# Patient Record
Sex: Male | Born: 1970 | Race: White | Hispanic: No | Marital: Married | Smoking: Former smoker
Health system: Southern US, Community
[De-identification: ages and names within clinical notes are randomized; demographics above are authoritative.]

## PROBLEM LIST (undated history)

## (undated) DIAGNOSIS — R918 Other nonspecific abnormal finding of lung field: Secondary | ICD-10-CM

## (undated) DIAGNOSIS — J45909 Unspecified asthma, uncomplicated: Secondary | ICD-10-CM

## (undated) DIAGNOSIS — J90 Pleural effusion, not elsewhere classified: Secondary | ICD-10-CM

## (undated) DIAGNOSIS — C8590 Non-Hodgkin lymphoma, unspecified, unspecified site: Secondary | ICD-10-CM

## (undated) DIAGNOSIS — I4891 Unspecified atrial fibrillation: Secondary | ICD-10-CM

## (undated) DIAGNOSIS — Z8616 Personal history of COVID-19: Secondary | ICD-10-CM

## (undated) DIAGNOSIS — K219 Gastro-esophageal reflux disease without esophagitis: Secondary | ICD-10-CM

## (undated) HISTORY — DX: Personal history of COVID-19: Z86.16

## (undated) HISTORY — DX: Other nonspecific abnormal finding of lung field: R91.8

## (undated) HISTORY — DX: Gastro-esophageal reflux disease without esophagitis: K21.9

## (undated) HISTORY — DX: Unspecified atrial fibrillation: I48.91

## (undated) HISTORY — DX: Pleural effusion, not elsewhere classified: J90

---

## 2019-08-31 ENCOUNTER — Other Ambulatory Visit: Payer: Self-pay

## 2020-11-01 ENCOUNTER — Other Ambulatory Visit: Payer: Self-pay | Admitting: Specialist

## 2020-11-01 DIAGNOSIS — R0602 Shortness of breath: Secondary | ICD-10-CM

## 2020-11-11 ENCOUNTER — Emergency Department: Payer: Medicaid Other

## 2020-11-11 ENCOUNTER — Other Ambulatory Visit: Payer: Self-pay

## 2020-11-11 ENCOUNTER — Inpatient Hospital Stay
Admission: EM | Admit: 2020-11-11 | Discharge: 2020-11-14 | DRG: 193 | Disposition: A | Discharge status: Home / Self Care | Payer: Medicaid Other | Attending: Hospitalist | Admitting: Hospitalist

## 2020-11-11 DIAGNOSIS — Z6821 Body mass index (BMI) 21.0-21.9, adult: Secondary | ICD-10-CM

## 2020-11-11 DIAGNOSIS — R918 Other nonspecific abnormal finding of lung field: Secondary | ICD-10-CM | POA: Diagnosis present

## 2020-11-11 DIAGNOSIS — E871 Hypo-osmolality and hyponatremia: Secondary | ICD-10-CM | POA: Diagnosis present

## 2020-11-11 DIAGNOSIS — J189 Pneumonia, unspecified organism: Principal | ICD-10-CM | POA: Diagnosis present

## 2020-11-11 DIAGNOSIS — F17210 Nicotine dependence, cigarettes, uncomplicated: Secondary | ICD-10-CM | POA: Diagnosis present

## 2020-11-11 DIAGNOSIS — C3492 Malignant neoplasm of unspecified part of left bronchus or lung: Secondary | ICD-10-CM

## 2020-11-11 DIAGNOSIS — E43 Unspecified severe protein-calorie malnutrition: Secondary | ICD-10-CM | POA: Insufficient documentation

## 2020-11-11 DIAGNOSIS — K219 Gastro-esophageal reflux disease without esophagitis: Secondary | ICD-10-CM

## 2020-11-11 DIAGNOSIS — J440 Chronic obstructive pulmonary disease with acute lower respiratory infection: Secondary | ICD-10-CM | POA: Diagnosis present

## 2020-11-11 DIAGNOSIS — J45901 Unspecified asthma with (acute) exacerbation: Secondary | ICD-10-CM | POA: Diagnosis present

## 2020-11-11 DIAGNOSIS — Z79899 Other long term (current) drug therapy: Secondary | ICD-10-CM

## 2020-11-11 DIAGNOSIS — J441 Chronic obstructive pulmonary disease with (acute) exacerbation: Secondary | ICD-10-CM | POA: Diagnosis present

## 2020-11-11 DIAGNOSIS — Z20822 Contact with and (suspected) exposure to covid-19: Secondary | ICD-10-CM | POA: Diagnosis present

## 2020-11-11 HISTORY — DX: Unspecified asthma, uncomplicated: J45.909

## 2020-11-11 LAB — COMPREHENSIVE METABOLIC PANEL
ALT: 13 U/L (ref 0–44)
AST: 25 U/L (ref 15–41)
Albumin: 2.9 g/dL — ABNORMAL LOW (ref 3.5–5.0)
Alkaline Phosphatase: 76 U/L (ref 38–126)
Anion gap: 11 (ref 5–15)
BUN: 9 mg/dL (ref 6–20)
CO2: 23 mmol/L (ref 22–32)
Calcium: 9 mg/dL (ref 8.9–10.3)
Chloride: 99 mmol/L (ref 98–111)
Creatinine, Ser: 0.44 mg/dL — ABNORMAL LOW (ref 0.61–1.24)
GFR, Estimated: >60 mL/min (ref >60)
Glucose, Bld: 124 mg/dL — ABNORMAL HIGH (ref 70–99)
Potassium: 4.3 mmol/L (ref 3.5–5.1)
Sodium: 133 mmol/L — ABNORMAL LOW (ref 135–145)
Total Bilirubin: 0.5 mg/dL (ref 0.3–1.2)
Total Protein: 6.9 g/dL (ref 6.5–8.1)

## 2020-11-11 LAB — CBC
HCT: 43.2 % (ref 39.0–52.0)
Hemoglobin: 14.9 g/dL (ref 13.0–17.0)
MCH: 32.7 pg (ref 26.0–34.0)
MCHC: 34.5 g/dL (ref 30.0–36.0)
MCV: 94.9 fL (ref 80.0–100.0)
Platelets: 410 10*3/uL — ABNORMAL HIGH (ref 150–400)
RBC: 4.55 MIL/uL (ref 4.22–5.81)
RDW: 13.1 % (ref 11.5–15.5)
WBC: 11.1 10*3/uL — ABNORMAL HIGH (ref 4.0–10.5)
nRBC: 0 % (ref 0.0–0.2)

## 2020-11-11 LAB — LACTIC ACID, PLASMA: Lactic Acid, Venous: 1.3 mmol/L (ref 0.5–1.9)

## 2020-11-11 LAB — RESP PANEL BY RT-PCR (FLU A&B, COVID) ARPGX2
Influenza A by PCR: NEGATIVE
Influenza B by PCR: NEGATIVE
SARS Coronavirus 2 by RT PCR: NEGATIVE

## 2020-11-11 MED ORDER — DM-GUAIFENESIN ER 30-600 MG PO TB12
1.0000 | ORAL_TABLET | Freq: Two times a day (BID) | ORAL | Status: DC
  Administered 2020-11-12 – 2020-11-14 (×5): 1 via ORAL
  Filled 2020-11-11 (×7): qty 1

## 2020-11-11 MED ORDER — SODIUM CHLORIDE 0.9 % IV SOLN
2.0000 g | INTRAVENOUS | Status: DC
  Administered 2020-11-12 – 2020-11-14 (×3): 2 g via INTRAVENOUS
  Filled 2020-11-11 (×4): qty 20

## 2020-11-11 MED ORDER — IOHEXOL 350 MG/ML SOLN
75.0000 mL | Freq: Once | INTRAVENOUS | Status: AC | PRN
  Administered 2020-11-11: 75 mL via INTRAVENOUS

## 2020-11-11 MED ORDER — DM-GUAIFENESIN ER 30-600 MG PO TB12
1.0000 | ORAL_TABLET | Freq: Two times a day (BID) | ORAL | Status: DC | PRN
  Administered 2020-11-12 – 2020-11-14 (×2): 1 via ORAL
  Filled 2020-11-11 (×2): qty 1

## 2020-11-11 MED ORDER — SODIUM CHLORIDE 0.9 % IV BOLUS
1000.0000 mL | Freq: Once | INTRAVENOUS | Status: AC
  Administered 2020-11-11: 1000 mL via INTRAVENOUS

## 2020-11-11 MED ORDER — ACETAMINOPHEN 650 MG RE SUPP: 650.0000 mg | Freq: Four times a day (QID) | RECTAL | Status: DC | PRN

## 2020-11-11 MED ORDER — SODIUM CHLORIDE 0.9 % IV SOLN
500.0000 mg | INTRAVENOUS | Status: DC
  Administered 2020-11-12 – 2020-11-13 (×3): 500 mg via INTRAVENOUS
  Filled 2020-11-11 (×5): qty 500

## 2020-11-11 MED ORDER — MAGNESIUM SULFATE 2 GM/50ML IV SOLN
2.0000 g | INTRAVENOUS | Status: AC
  Administered 2020-11-11: 2 g via INTRAVENOUS
  Filled 2020-11-11: qty 50

## 2020-11-11 MED ORDER — SODIUM CHLORIDE 0.9 % IV SOLN: INTRAVENOUS | Status: DC

## 2020-11-11 MED ORDER — ENOXAPARIN SODIUM 40 MG/0.4ML IJ SOSY
40.0000 mg | PREFILLED_SYRINGE | INTRAMUSCULAR | Status: DC
  Administered 2020-11-11 – 2020-11-13 (×3): 40 mg via SUBCUTANEOUS
  Filled 2020-11-11 (×3): qty 0.4

## 2020-11-11 MED ORDER — METHYLPREDNISOLONE SODIUM SUCC 125 MG IJ SOLR
125.0000 mg | Freq: Once | INTRAMUSCULAR | Status: AC
  Administered 2020-11-11: 125 mg via INTRAVENOUS
  Filled 2020-11-11: qty 2

## 2020-11-11 MED ORDER — IPRATROPIUM-ALBUTEROL 0.5-2.5 (3) MG/3ML IN SOLN
3.0000 mL | Freq: Four times a day (QID) | RESPIRATORY_TRACT | Status: DC
  Administered 2020-11-12 (×3): 3 mL via RESPIRATORY_TRACT
  Filled 2020-11-11 (×3): qty 3

## 2020-11-11 MED ORDER — MAGNESIUM HYDROXIDE 400 MG/5ML PO SUSP: 30.0000 mL | Freq: Every day | ORAL | Status: DC | PRN

## 2020-11-11 MED ORDER — SODIUM CHLORIDE 0.9 % IV SOLN
2.0000 g | Freq: Once | INTRAVENOUS | Status: AC
  Administered 2020-11-11: 2 g via INTRAVENOUS
  Filled 2020-11-11: qty 2

## 2020-11-11 MED ORDER — ALBUTEROL SULFATE (2.5 MG/3ML) 0.083% IN NEBU
5.0000 mg | INHALATION_SOLUTION | Freq: Once | RESPIRATORY_TRACT | Status: AC
  Administered 2020-11-11: 5 mg via RESPIRATORY_TRACT
  Filled 2020-11-11: qty 6

## 2020-11-11 MED ORDER — ACETAMINOPHEN 325 MG PO TABS: 650.0000 mg | ORAL_TABLET | Freq: Four times a day (QID) | ORAL | Status: DC | PRN

## 2020-11-11 MED ORDER — ONDANSETRON HCL 4 MG/2ML IJ SOLN: 4.0000 mg | Freq: Four times a day (QID) | INTRAMUSCULAR | Status: DC | PRN

## 2020-11-11 MED ORDER — VANCOMYCIN HCL 1500 MG/300ML IV SOLN
1500.0000 mg | Freq: Once | INTRAVENOUS | Status: AC
  Administered 2020-11-11: 1500 mg via INTRAVENOUS
  Filled 2020-11-11: qty 300

## 2020-11-11 MED ORDER — ONDANSETRON HCL 4 MG PO TABS: 4.0000 mg | ORAL_TABLET | Freq: Four times a day (QID) | ORAL | Status: DC | PRN

## 2020-11-11 MED ORDER — VANCOMYCIN HCL IN DEXTROSE 1-5 GM/200ML-% IV SOLN: 1000.0000 mg | Freq: Once | INTRAVENOUS | Status: DC

## 2020-11-11 MED ORDER — TRAZODONE HCL 50 MG PO TABS
25.0000 mg | ORAL_TABLET | Freq: Every evening | ORAL | Status: DC | PRN
  Administered 2020-11-12 – 2020-11-13 (×2): 25 mg via ORAL
  Filled 2020-11-11 (×2): qty 1

## 2020-11-11 MED ORDER — SODIUM CHLORIDE 0.9 % IV BOLUS
500.0000 mL | Freq: Once | INTRAVENOUS | Status: AC
  Administered 2020-11-12: 500 mL via INTRAVENOUS

## 2020-11-11 MED ORDER — IPRATROPIUM-ALBUTEROL 0.5-2.5 (3) MG/3ML IN SOLN
3.0000 mL | Freq: Once | RESPIRATORY_TRACT | Status: AC
  Administered 2020-11-11: 3 mL via RESPIRATORY_TRACT
  Filled 2020-11-11: qty 3

## 2020-11-11 NOTE — ED Notes (Signed)
Patient transported to CT 

## 2020-11-11 NOTE — ED Provider Notes (Signed)
[facility] Emergency Department Provider Note  ____________________________________________  Time seen: Approximately 10:51 PM  I have reviewed the triage vital signs and the nursing notes.   HISTORY  Chief Complaint Shortness of Breath    HPI Roc Streett. is a 50 y.o. male with no known past medical history who comes the ED due to persistent shortness of breath wheezing and cough that is been going on for several weeks.  He has previously seen primary care, completed 2 course of antibiotics without relief.  Referred to pulmonology, but has not had additional imaging yet.  Previous chest x-rays were concerning for possible pulmonary mass.  Patient also reports central chest pain that is sharp and pleuritic.  Nonradiating.  No alleviating factors, moderate intensity.      No past medical history on file.   Patient Active Problem List   Diagnosis Date Noted  . CAP (community acquired pneumonia) 11/11/2020        Prior to Admission medications   Medication Sig Start Date End Date Taking? Authorizing Provider  albuterol (PROVENTIL) (2.5 MG/3ML) 0.083% nebulizer solution Take 2.5 mg by nebulization every 6 (six) hours as needed for wheezing or shortness of breath.   Yes [provider]  albuterol (VENTOLIN HFA) 108 (90 Base) MCG/ACT inhaler Inhale 1-2 puffs into the lungs every 4 (four) hours as needed for wheezing or shortness of breath.   Yes [provider]  chlorpheniramine-HYDROcodone (TUSSIONEX) 10-8 MG/5ML SUER Take 5 mLs by mouth every 12 (twelve) hours as needed for cough. 11/09/20  Yes [provider]  omeprazole (PRILOSEC) 10 MG capsule Take 20 mg by mouth daily.   Yes [provider]     Allergies Patient has no known allergies.   No family history on file.  Social History    Review of Systems  Constitutional:   No fever or chills.  ENT:   No sore throat. No rhinorrhea. Cardiovascular:    Positive chest pain as above without syncope. Respiratory: Positive shortness of breath and cough. Gastrointestinal:   Negative for abdominal pain, vomiting and diarrhea.  Musculoskeletal:   Negative for focal pain or swelling All other systems reviewed and are negative except as documented above in ROS and HPI.  ____________________________________________   PHYSICAL EXAM:  VITAL SIGNS: ED Triage Vitals  Enc Vitals Group     BP 11/11/20 1656 120/81     Pulse Rate 11/11/20 1656 (!) 136     Resp 11/11/20 1656 20     Temp 11/11/20 1656 98.3 F (36.8 C)     Temp Source 11/11/20 1656 Oral     SpO2 11/11/20 1656 96 %     Weight 11/11/20 1657 140 lb (63.5 kg)     Height 11/11/20 1657 5\' 8"  (1.727 m)     Head Circumference --      Peak Flow --      Pain Score 11/11/20 1657 8     Pain Loc --      Pain Edu? --      Excl. in [place]? --     Vital signs reviewed, nursing assessments reviewed.   Constitutional:   Alert and oriented. Non-toxic appearance. Eyes:   Conjunctivae are normal. EOMI. PERRL. ENT      Head:   Normocephalic and atraumatic.      Nose:   Wearing a mask.      Mouth/Throat:   Wearing a mask.      Neck:   No meningismus. Full  ROM. Hematological/Lymphatic/Immunilogical:   No cervical lymphadenopathy. Cardiovascular:   Tachycardia heart rate 130. Symmetric bilateral radial and DP pulses.  No murmurs. Cap refill less than 2 seconds. Respiratory: Normal work of breathing without retractions or tachypnea.  Diffuse left-sided crackles.  Diffuse wheezing and prolonged expiratory phase. Gastrointestinal:   Soft and nontender. Non distended. There is no CVA tenderness.  No rebound, rigidity, or guarding.  Musculoskeletal:   Normal range of motion in all extremities. No joint effusions.  No lower extremity tenderness.  No edema. Neurologic:   Normal speech and language.  Motor grossly intact. No acute focal neurologic deficits are appreciated.  Skin:    Skin is warm, dry and  intact. No rash noted.  No petechiae, purpura, or bullae.  ____________________________________________    LABS (pertinent positives/negatives) (all labs ordered are listed, but only abnormal results are displayed) Labs Reviewed  CBC - Abnormal; Notable for the following components:      Result Value   WBC 11.1 (*)    Platelets 410 (*)    All other components within normal limits  COMPREHENSIVE METABOLIC PANEL - Abnormal; Notable for the following components:   Sodium 133 (*)    Glucose, Bld 124 (*)    Creatinine, Ser 0.44 (*)    Albumin 2.9 (*)    All other components within normal limits  RESP PANEL BY RT-PCR (FLU A&B, COVID) ARPGX2  CULTURE, BLOOD (ROUTINE X 2)  CULTURE, BLOOD (ROUTINE X 2)  EXPECTORATED SPUTUM ASSESSMENT W GRAM STAIN, RFLX TO RESP C  LACTIC ACID, PLASMA  HIV ANTIBODY (ROUTINE TESTING W REFLEX)  BASIC METABOLIC PANEL  CBC  LEGIONELLA PNEUMOPHILA SEROGP 1 UR AG  STREP PNEUMONIAE URINARY ANTIGEN   ____________________________________________   EKG  Interpreted by me Sinus tachycardia rate 131.  Right axis.  Normal QRS.  Normal ST segments.  There is an S1Q3T3 pattern  ____________________________________________    RADIOLOGY  DG Chest 2 View  Result Date: 11/11/2020 CLINICAL DATA:  Shortness of breath EXAM: CHEST - 2 VIEW COMPARISON:  None. FINDINGS: There is an area that appears masslike in the posterior segment of the left lower lobe measuring 6.8 x 6.5 x 6.4 cm. There is extensive airspace opacity throughout the left upper lobe as well. A small area of infiltrate is noted more inferiorly on the left. Right lung is clear. Heart size and pulmonary vascularity normal. Questionable adenopathy left hilum. No bone lesions. IMPRESSION: Suspected neoplastic focus in the superior segment left lower lobe measuring 6.8 x 6.5 x 6.4 cm. Apparent pneumonia throughout the left upper lobe. Ill-defined opacity left lower lobe may represent focal pneumonia, although on  the frontal view, this area appears masslike and could represent a second potential neoplasm. This area on frontal view measures 3.7 x 3.1 cm. Advise chest CT for further assessment. Right lung clear. Heart size normal. Suspected left hilar adenopathy. Electronically Signed   By: Lowella Grip III M.D.   On: 11/11/2020 17:41   CT Angio Chest PE W and/or Wo Contrast  Result Date: 11/11/2020 CLINICAL DATA:  Lung mass. EXAM: CT ANGIOGRAPHY CHEST CT ABDOMEN AND PELVIS WITH CONTRAST TECHNIQUE: Multidetector CT imaging of the chest was performed using the standard protocol during bolus administration of intravenous contrast. Multiplanar CT image reconstructions and MIPs were obtained to evaluate the vascular anatomy. Multidetector CT imaging of the abdomen and pelvis was performed using the standard protocol during bolus administration of intravenous contrast. CONTRAST:  38mL OMNIPAQUE IOHEXOL 350 MG/ML SOLN COMPARISON:  Chest  x-ray from same day. FINDINGS: CTA CHEST FINDINGS Cardiovascular: Satisfactory opacification of the pulmonary arteries to the segmental level. No evidence of pulmonary embolism. Narrowing of the left main, lobar, and segmental pulmonary arteries due to tumor. Normal heart size. No pericardial effusion. Mediastinum/Nodes: Bulky mediastinal left hilar lymphadenopathy. Index prevascular lymph node measures 2.2 cm in short axis. Conglomerate subcarinal and left hilar lymphadenopathy and/or tumor, difficult to measure. Lower paraesophageal lymph node measuring 9 mm in short axis (series 4, image 81). Prominent left cardiophrenic angle lymph node measuring 7 mm in short axis (series 4, image 96). No pathologically enlarged right hilar or axillary lymph nodes. The thyroid gland, trachea, and esophagus demonstrate no significant findings. Lungs/Pleura: Large left perihilar and medial upper lobe mass, difficult to measure, with near complete occlusion of the central left upper and lower lobe bronchi.  7.5 x 4.1 cm mass predominantly within the superior segment of the left lower lobe, but crossing the major fissure. 1.2 x 2.6 cm subpleural nodule in the anterior left lower lobe (series 4, image 73). Patchy consolidation in the left upper lobe due to post obstructive pneumonia. Nodular interlobular septal thickening in the left upper lobe. Nodularity along the left major fissure superiorly. Small to moderate partially loculated left pleural effusion. The right lung is clear. No pneumothorax. Musculoskeletal: No chest wall abnormality. No acute or significant osseous findings. Review of the MIP images confirms the above findings. CT ABDOMEN AND PELVIS FINDINGS Hepatobiliary: Few scattered subcentimeter low-density lesions within the liver, too small to characterize. The gallbladder is unremarkable. No biliary dilatation. Pancreas: Unremarkable. No pancreatic ductal dilatation or surrounding inflammatory changes. Spleen: Normal in size without focal abnormality. Adrenals/Urinary Tract: Adrenal glands are unremarkable. 1.3 cm left renal simple cyst. No renal calculi or hydronephrosis. The bladder is under distended. Stomach/Bowel: Stomach is within normal limits. Appendix appears normal. No evidence of bowel wall thickening, distention, or inflammatory changes. Vascular/Lymphatic: No significant vascular findings are present. No enlarged abdominal or pelvic lymph nodes. Reproductive: Prostate is unremarkable. Other: Small fat containing left inguinal hernia. No free fluid or pneumoperitoneum. Musculoskeletal: No acute or significant osseous findings. Review of the MIP images confirms the above findings. IMPRESSION: CT chest: 1. Large left perihilar and medial upper lobe mass with mediastinal invasion, consistent with primary lung cancer. Metastases in the left lower lobe measuring up to 7.5 cm. 2. Left upper lobe lymphangitic carcinomatosis. Bulky mediastinal and left hilar nodal metastases. 3. Near complete occlusion  of the central left upper and lower lobe bronchi. Left upper lobe postobstructive pneumonia. 4. No evidence of pulmonary embolism. Narrowing of the left pulmonary arteries due to tumor. 5. 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 CT abdomen pelvis: 1. No evidence of metastatic disease in the abdomen or pelvis. Electronically Signed   By: Titus Dubin M.D.   On: 11/11/2020 21:37   CT ABDOMEN PELVIS W CONTRAST  Result Date: 11/11/2020 CLINICAL DATA:  Lung mass. EXAM: CT ANGIOGRAPHY CHEST CT ABDOMEN AND PELVIS WITH CONTRAST TECHNIQUE: Multidetector CT imaging of the chest was performed using the standard protocol during bolus administration of intravenous contrast. Multiplanar CT image reconstructions and MIPs were obtained to evaluate the vascular anatomy. Multidetector CT imaging of the abdomen and pelvis was performed using the standard protocol during bolus administration of intravenous contrast. CONTRAST:  3mL OMNIPAQUE IOHEXOL 350 MG/ML SOLN COMPARISON:  Chest x-ray from same day. FINDINGS: CTA CHEST FINDINGS Cardiovascular: Satisfactory opacification of the pulmonary arteries to the segmental level. No evidence of  pulmonary embolism. Narrowing of the left main, lobar, and segmental pulmonary arteries due to tumor. Normal heart size. No pericardial effusion. Mediastinum/Nodes: Bulky mediastinal left hilar lymphadenopathy. Index prevascular lymph node measures 2.2 cm in short axis. Conglomerate subcarinal and left hilar lymphadenopathy and/or tumor, difficult to measure. Lower paraesophageal lymph node measuring 9 mm in short axis (series 4, image 81). Prominent left cardiophrenic angle lymph node measuring 7 mm in short axis (series 4, image 96). No pathologically enlarged right hilar or axillary lymph nodes. The thyroid gland, trachea, and esophagus demonstrate no significant findings. Lungs/Pleura: Large left perihilar and medial upper lobe mass, difficult to measure, with  near complete occlusion of the central left upper and lower lobe bronchi. 7.5 x 4.1 cm mass predominantly within the superior segment of the left lower lobe, but crossing the major fissure. 1.2 x 2.6 cm subpleural nodule in the anterior left lower lobe (series 4, image 73). Patchy consolidation in the left upper lobe due to post obstructive pneumonia. Nodular interlobular septal thickening in the left upper lobe. Nodularity along the left major fissure superiorly. Small to moderate partially loculated left pleural effusion. The right lung is clear. No pneumothorax. Musculoskeletal: No chest wall abnormality. No acute or significant osseous findings. Review of the MIP images confirms the above findings. CT ABDOMEN AND PELVIS FINDINGS Hepatobiliary: Few scattered subcentimeter low-density lesions within the liver, too small to characterize. The gallbladder is unremarkable. No biliary dilatation. Pancreas: Unremarkable. No pancreatic ductal dilatation or surrounding inflammatory changes. Spleen: Normal in size without focal abnormality. Adrenals/Urinary Tract: Adrenal glands are unremarkable. 1.3 cm left renal simple cyst. No renal calculi or hydronephrosis. The bladder is under distended. Stomach/Bowel: Stomach is within normal limits. Appendix appears normal. No evidence of bowel wall thickening, distention, or inflammatory changes. Vascular/Lymphatic: No significant vascular findings are present. No enlarged abdominal or pelvic lymph nodes. Reproductive: Prostate is unremarkable. Other: Small fat containing left inguinal hernia. No free fluid or pneumoperitoneum. Musculoskeletal: No acute or significant osseous findings. Review of the MIP images confirms the above findings. IMPRESSION: CT chest: 1. Large left perihilar and medial upper lobe mass with mediastinal invasion, consistent with primary lung cancer. Metastases in the left lower lobe measuring up to 7.5 cm. 2. Left upper lobe lymphangitic carcinomatosis.  Bulky mediastinal and left hilar nodal metastases. 3. Near complete occlusion of the central left upper and lower lobe bronchi. Left upper lobe postobstructive pneumonia. 4. No evidence of pulmonary embolism. Narrowing of the left pulmonary arteries due to tumor. 5. 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 CT abdomen pelvis: 1. No evidence of metastatic disease in the abdomen or pelvis. Electronically Signed   By: Titus Dubin M.D.   On: 11/11/2020 21:37    ____________________________________________   PROCEDURES Procedures  ____________________________________________  DIFFERENTIAL DIAGNOSIS   Pulmonary embolism, postobstructive pneumonia, lung cancer, pleural effusion, bronchitis with bronchoconstriction  CLINICAL IMPRESSION / ASSESSMENT AND PLAN / ED COURSE  Medications ordered in the ED: Medications  vancomycin (VANCOREADY) IVPB 1500 mg/300 mL (1,500 mg Intravenous New Bag/Given 11/11/20 2141)  enoxaparin (LOVENOX) injection 40 mg (has no administration in time range)  0.9 %  sodium chloride infusion (has no administration in time range)  acetaminophen (TYLENOL) tablet 650 mg (has no administration in time range)    Or  acetaminophen (TYLENOL) suppository 650 mg (has no administration in time range)  traZODone (DESYREL) tablet 25 mg (has no administration in time range)  magnesium hydroxide (MILK OF MAGNESIA) suspension 30 mL (has no  administration in time range)  ondansetron (ZOFRAN) tablet 4 mg (has no administration in time range)    Or  ondansetron (ZOFRAN) injection 4 mg (has no administration in time range)  cefTRIAXone (ROCEPHIN) 2 g in sodium chloride 0.9 % 100 mL IVPB (has no administration in time range)  azithromycin (ZITHROMAX) 500 mg in sodium chloride 0.9 % 250 mL IVPB (has no administration in time range)  dextromethorphan-guaiFENesin (MUCINEX DM) 30-600 MG per 12 hr tablet 1 tablet (has no administration in time range)   dextromethorphan-guaiFENesin (MUCINEX DM) 30-600 MG per 12 hr tablet 1 tablet (has no administration in time range)  ipratropium-albuterol (DUONEB) 0.5-2.5 (3) MG/3ML nebulizer solution 3 mL (has no administration in time range)  sodium chloride 0.9 % bolus 1,000 mL (0 mLs Intravenous Stopped 11/11/20 2146)  methylPREDNISolone sodium succinate (SOLU-MEDROL) 125 mg/2 mL injection 125 mg (125 mg Intravenous Given 11/11/20 2040)  magnesium sulfate IVPB 2 g 50 mL (0 g Intravenous Stopped 11/11/20 2141)  ipratropium-albuterol (DUONEB) 0.5-2.5 (3) MG/3ML nebulizer solution 3 mL (3 mLs Nebulization Given 11/11/20 2032)  albuterol (PROVENTIL) (2.5 MG/3ML) 0.083% nebulizer solution 5 mg (5 mg Nebulization Given 11/11/20 2038)  ceFEPIme (MAXIPIME) 2 g in sodium chloride 0.9 % 100 mL IVPB (0 g Intravenous Stopped 11/11/20 2131)  iohexol (OMNIPAQUE) 350 MG/ML injection 75 mL (75 mLs Intravenous Contrast Given 11/11/20 2055)    Pertinent labs & imaging results that were available during my care of the patient were reviewed by me and considered in my medical decision making (see chart for details).  Curtis Pyo. was evaluated in Emergency Department on 11/11/2020 for the symptoms described in the history of present illness. He was evaluated in the context of the global COVID-19 pandemic, which necessitated consideration that the patient might be at risk for infection with the SARS-CoV-2 virus that causes COVID-19. Institutional protocols and algorithms that pertain to the evaluation of patients at risk for COVID-19 are in a state of rapid change based on information released by regulatory bodies including the CDC and federal and state organizations. These policies and algorithms were followed during the patient's care in the ED.     Clinical Course as of 11/11/20 2251  Fri Nov 11, 2020  [id number] Patient presents with tachycardia, clinical presentation of bronchoconstriction, pleuritic chest pain, and x-ray evidence  of likely undiagnosed lung cancer.  High suspicion for PE.  Will obtain CT angiogram of the chest along with CT scan abdomen pelvis to evaluate for metastatic disease.  We will give bronchodilators steroids and magnesium, vancomycin and cefepime for HCAP/outpatient treatment failure, and plan to admit [PS]    Clinical Course User Index [PS] Carrie Mew, MD     ----------------------------------------- 11:19 PM on 11/11/2020 -----------------------------------------  CT negative for PE but does show large lung mass with localized lung metastasis, no distant metastases.  There is also postobstructive pneumonia.  Lactate is normal, blood culture sent.  Case d/w hospitalist.  ____________________________________________   FINAL CLINICAL IMPRESSION(S) / ED DIAGNOSES    Final diagnoses:  Malignant neoplasm of left lung, unspecified part of lung ([place])  Postobstructive pneumonia     ED Discharge Orders    None      Portions of this note were generated with dragon dictation software. Dictation errors may occur despite best attempts at proofreading.   Carrie Mew, MD 11/11/20 2320

## 2020-11-11 NOTE — H&P (Signed)
[facility]   PATIENT NAME: Curtis Manning    MR#:  [id number]  DATE OF BIRTH:  1970/10/13  DATE OF ADMISSION:  11/11/2020  PRIMARY CARE PHYSICIAN: Pcp, No   Patient is coming from: Home  REQUESTING/REFERRING PHYSICIAN: Brenton Grills, MD CHIEF COMPLAINT:   Chief Complaint  Patient presents with  . Shortness of Breath    HISTORY OF PRESENT ILLNESS:  Curtis Junious. is a 50 y.o. Caucasian male with medical history significant for asthma, ongoing tobacco abuseand GERD who presented to the emergency room with acute onset of significant worsening dyspnea with associated cough productive of clear sputum as well as wheezing.  The patient has been actually having symptoms for the last few weeks.  He was seen on an outpatient basis and was given 2 courses of antibiotics.  He had a chest x-ray that showed suspected lung mass and was supposed to be referred to pulmonology.  He admitted to weight loss of about 35 pounds over the last 2 to 3 months.  He has been having night sweats and fever.  No chest pain or palpitations.  No nausea or vomiting or abdominal pain.  No dysuria, regular hematuria or flank pain.  No bleeding diathesis.   ED Course: Upon presentation to the ER rate was 136 with otherwise normal vital signs and later heart rate is 123.  Respiratory rate was initially normal and later 29.  Labs revealed hyponatremia 133 and albumin 2.9.  CBC showed minimal leukocytosis of 11.1 and thrombocytosis of 410.  Lactic acid was 1.3.  Influenza antigens and COVID-19 PCR came back negative.  Blood cultures were drawn.   EKG as reviewed by me : Showed heart rate was 136 with otherwise normal vital signs showed sinus tachycardia with a rate of 131 with right superior axis deviation, right ventricle hypertrophy. Imaging: Two-view chest x-ray showed: Suspected neoplastic focus in the superior segment left lower lobe measuring 6.8 x 6.5 x 6.4 cm. Apparent pneumonia throughout the left upper  lobe. Ill-defined opacity left lower lobe may represent focal pneumonia, although on the frontal view, this area appears masslike and could represent a second potential neoplasm. This area on frontal view measures 3.7 x 3.1 cm as well as left hilar adenopathy. Chest and abdomen CTA revealed the following: 1. Large left perihilar and medial upper lobe mass with mediastinal invasion, consistent with primary lung cancer. Metastases in the left lower lobe measuring up to 7.5 cm. 2. Left upper lobe lymphangitic carcinomatosis. Bulky mediastinal and left hilar nodal metastases. 3. Near complete occlusion of the central left upper and lower lobe bronchi. Left upper lobe postobstructive pneumonia. 4. No evidence of pulmonary embolism. Narrowing of the left pulmonary arteries due to tumor. 5. 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  CT abdomen pelvis:  1. No evidence of metastatic disease in the abdomen or pelvis.  The patient was given IV cefepime and vancomycin, duo nebs, 5 mg of nebulized albuterol, IV magnesium sulfate, IV Solu-Medrol and 1 L bolus of IV normal saline.  He will be admitted to a medical monitored bed for further evaluation and management.  PAST MEDICAL HISTORY:  Asthma, tobacco abuse and GERD.  PAST SURGICAL HISTORY:  He denies any previous surgeries.  SOCIAL HISTORY:   Social History   Tobacco Use  . Smoking status: Not on file  . Smokeless tobacco: Not on file  Substance Use Topics  . Alcohol use: Not on file  He smokes 1  to 2 cigarettes/day currently and used to smoke half a pack of cigarettes per day for long time.  FAMILY HISTORY:  His father had heart surgery likely CABG for coronary artery disease.  DRUG ALLERGIES:  No Known Allergies  REVIEW OF SYSTEMS:   ROS As per history of present illness. All pertinent systems were reviewed above. Constitutional, HEENT, cardiovascular, respiratory, GI, GU, musculoskeletal,  neuro, psychiatric, endocrine, integumentary and hematologic systems were reviewed and are otherwise negative/unremarkable except for positive findings mentioned above in the HPI.   MEDICATIONS AT HOME:   Prior to Admission medications   Not on File      VITAL SIGNS:  Blood pressure 121/86, pulse (!) 124, temperature 98.2 F (36.8 C), temperature source Oral, resp. rate (!) 27, height 5\' 8"  (1.727 m), weight 63.5 kg, SpO2 98 %.  PHYSICAL EXAMINATION:  Physical Exam  GENERAL:  50 y.o.-year-old Caucasian male patient lying in the bed with minimal respiratory distress with conversational dyspnea. EYES: Pupils equal, round, reactive to light and accommodation. No scleral icterus. Extraocular muscles intact.  HEENT: Head atraumatic, normocephalic. Oropharynx and nasopharynx clear.  NECK:  Supple, no jugular venous distention. No thyroid enlargement, no tenderness.  LUNGS: Significantly diminished left basal and midlung zone breath sounds with positive bronchophony. CARDIOVASCULAR: Regular rate and rhythm, S1, S2 normal. No murmurs, rubs, or gallops.  ABDOMEN: Soft, nondistended, nontender. Bowel sounds present. No organomegaly or mass.  EXTREMITIES: No pedal edema, cyanosis, or clubbing.  NEUROLOGIC: Cranial nerves II through XII are intact. Muscle strength 5/5 in all extremities. Sensation intact. Gait not checked.  PSYCHIATRIC: The patient is alert and oriented x 3.  Normal affect and good eye contact. SKIN: No obvious rash, lesion, or ulcer.   LABORATORY PANEL:   CBC Recent Labs  Lab 11/11/20 1702  WBC 11.1*  HGB 14.9  HCT 43.2  PLT 410*   ------------------------------------------------------------------------------------------------------------------  Chemistries  Recent Labs  Lab 11/11/20 1702  NA 133*  K 4.3  CL 99  CO2 23  GLUCOSE 124*  BUN 9  CREATININE 0.44*  CALCIUM 9.0  AST 25  ALT 13  ALKPHOS 76  BILITOT 0.5    ------------------------------------------------------------------------------------------------------------------  Cardiac Enzymes No results for input(s): TROPONINI in the last 168 hours. ------------------------------------------------------------------------------------------------------------------  RADIOLOGY:  DG Chest 2 View  Result Date: 11/11/2020 CLINICAL DATA:  Shortness of breath EXAM: CHEST - 2 VIEW COMPARISON:  None. FINDINGS: There is an area that appears masslike in the posterior segment of the left lower lobe measuring 6.8 x 6.5 x 6.4 cm. There is extensive airspace opacity throughout the left upper lobe as well. A small area of infiltrate is noted more inferiorly on the left. Right lung is clear. Heart size and pulmonary vascularity normal. Questionable adenopathy left hilum. No bone lesions. IMPRESSION: Suspected neoplastic focus in the superior segment left lower lobe measuring 6.8 x 6.5 x 6.4 cm. Apparent pneumonia throughout the left upper lobe. Ill-defined opacity left lower lobe may represent focal pneumonia, although on the frontal view, this area appears masslike and could represent a second potential neoplasm. This area on frontal view measures 3.7 x 3.1 cm. Advise chest CT for further assessment. Right lung clear. Heart size normal. Suspected left hilar adenopathy. Electronically Signed   By: Lowella Grip III M.D.   On: 11/11/2020 17:41   CT Angio Chest PE W and/or Wo Contrast  Result Date: 11/11/2020 CLINICAL DATA:  Lung mass. EXAM: CT ANGIOGRAPHY CHEST CT ABDOMEN AND PELVIS WITH CONTRAST TECHNIQUE: Multidetector CT imaging  of the chest was performed using the standard protocol during bolus administration of intravenous contrast. Multiplanar CT image reconstructions and MIPs were obtained to evaluate the vascular anatomy. Multidetector CT imaging of the abdomen and pelvis was performed using the standard protocol during bolus administration of intravenous contrast.  CONTRAST:  107mL OMNIPAQUE IOHEXOL 350 MG/ML SOLN COMPARISON:  Chest x-ray from same day. FINDINGS: CTA CHEST FINDINGS Cardiovascular: Satisfactory opacification of the pulmonary arteries to the segmental level. No evidence of pulmonary embolism. Narrowing of the left main, lobar, and segmental pulmonary arteries due to tumor. Normal heart size. No pericardial effusion. Mediastinum/Nodes: Bulky mediastinal left hilar lymphadenopathy. Index prevascular lymph node measures 2.2 cm in short axis. Conglomerate subcarinal and left hilar lymphadenopathy and/or tumor, difficult to measure. Lower paraesophageal lymph node measuring 9 mm in short axis (series 4, image 81). Prominent left cardiophrenic angle lymph node measuring 7 mm in short axis (series 4, image 96). No pathologically enlarged right hilar or axillary lymph nodes. The thyroid gland, trachea, and esophagus demonstrate no significant findings. Lungs/Pleura: Large left perihilar and medial upper lobe mass, difficult to measure, with near complete occlusion of the central left upper and lower lobe bronchi. 7.5 x 4.1 cm mass predominantly within the superior segment of the left lower lobe, but crossing the major fissure. 1.2 x 2.6 cm subpleural nodule in the anterior left lower lobe (series 4, image 73). Patchy consolidation in the left upper lobe due to post obstructive pneumonia. Nodular interlobular septal thickening in the left upper lobe. Nodularity along the left major fissure superiorly. Small to moderate partially loculated left pleural effusion. The right lung is clear. No pneumothorax. Musculoskeletal: No chest wall abnormality. No acute or significant osseous findings. Review of the MIP images confirms the above findings. CT ABDOMEN AND PELVIS FINDINGS Hepatobiliary: Few scattered subcentimeter low-density lesions within the liver, too small to characterize. The gallbladder is unremarkable. No biliary dilatation. Pancreas: Unremarkable. No pancreatic  ductal dilatation or surrounding inflammatory changes. Spleen: Normal in size without focal abnormality. Adrenals/Urinary Tract: Adrenal glands are unremarkable. 1.3 cm left renal simple cyst. No renal calculi or hydronephrosis. The bladder is under distended. Stomach/Bowel: Stomach is within normal limits. Appendix appears normal. No evidence of bowel wall thickening, distention, or inflammatory changes. Vascular/Lymphatic: No significant vascular findings are present. No enlarged abdominal or pelvic lymph nodes. Reproductive: Prostate is unremarkable. Other: Small fat containing left inguinal hernia. No free fluid or pneumoperitoneum. Musculoskeletal: No acute or significant osseous findings. Review of the MIP images confirms the above findings. IMPRESSION: CT chest: 1. Large left perihilar and medial upper lobe mass with mediastinal invasion, consistent with primary lung cancer. Metastases in the left lower lobe measuring up to 7.5 cm. 2. Left upper lobe lymphangitic carcinomatosis. Bulky mediastinal and left hilar nodal metastases. 3. Near complete occlusion of the central left upper and lower lobe bronchi. Left upper lobe postobstructive pneumonia. 4. No evidence of pulmonary embolism. Narrowing of the left pulmonary arteries due to tumor. 5. 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 CT abdomen pelvis: 1. No evidence of metastatic disease in the abdomen or pelvis. Electronically Signed   By: Titus Dubin M.D.   On: 11/11/2020 21:37   CT ABDOMEN PELVIS W CONTRAST  Result Date: 11/11/2020 CLINICAL DATA:  Lung mass. EXAM: CT ANGIOGRAPHY CHEST CT ABDOMEN AND PELVIS WITH CONTRAST TECHNIQUE: Multidetector CT imaging of the chest was performed using the standard protocol during bolus administration of intravenous contrast. Multiplanar CT image reconstructions and MIPs were  obtained to evaluate the vascular anatomy. Multidetector CT imaging of the abdomen and pelvis was performed  using the standard protocol during bolus administration of intravenous contrast. CONTRAST:  72mL OMNIPAQUE IOHEXOL 350 MG/ML SOLN COMPARISON:  Chest x-ray from same day. FINDINGS: CTA CHEST FINDINGS Cardiovascular: Satisfactory opacification of the pulmonary arteries to the segmental level. No evidence of pulmonary embolism. Narrowing of the left main, lobar, and segmental pulmonary arteries due to tumor. Normal heart size. No pericardial effusion. Mediastinum/Nodes: Bulky mediastinal left hilar lymphadenopathy. Index prevascular lymph node measures 2.2 cm in short axis. Conglomerate subcarinal and left hilar lymphadenopathy and/or tumor, difficult to measure. Lower paraesophageal lymph node measuring 9 mm in short axis (series 4, image 81). Prominent left cardiophrenic angle lymph node measuring 7 mm in short axis (series 4, image 96). No pathologically enlarged right hilar or axillary lymph nodes. The thyroid gland, trachea, and esophagus demonstrate no significant findings. Lungs/Pleura: Large left perihilar and medial upper lobe mass, difficult to measure, with near complete occlusion of the central left upper and lower lobe bronchi. 7.5 x 4.1 cm mass predominantly within the superior segment of the left lower lobe, but crossing the major fissure. 1.2 x 2.6 cm subpleural nodule in the anterior left lower lobe (series 4, image 73). Patchy consolidation in the left upper lobe due to post obstructive pneumonia. Nodular interlobular septal thickening in the left upper lobe. Nodularity along the left major fissure superiorly. Small to moderate partially loculated left pleural effusion. The right lung is clear. No pneumothorax. Musculoskeletal: No chest wall abnormality. No acute or significant osseous findings. Review of the MIP images confirms the above findings. CT ABDOMEN AND PELVIS FINDINGS Hepatobiliary: Few scattered subcentimeter low-density lesions within the liver, too small to characterize. The gallbladder  is unremarkable. No biliary dilatation. Pancreas: Unremarkable. No pancreatic ductal dilatation or surrounding inflammatory changes. Spleen: Normal in size without focal abnormality. Adrenals/Urinary Tract: Adrenal glands are unremarkable. 1.3 cm left renal simple cyst. No renal calculi or hydronephrosis. The bladder is under distended. Stomach/Bowel: Stomach is within normal limits. Appendix appears normal. No evidence of bowel wall thickening, distention, or inflammatory changes. Vascular/Lymphatic: No significant vascular findings are present. No enlarged abdominal or pelvic lymph nodes. Reproductive: Prostate is unremarkable. Other: Small fat containing left inguinal hernia. No free fluid or pneumoperitoneum. Musculoskeletal: No acute or significant osseous findings. Review of the MIP images confirms the above findings. IMPRESSION: CT chest: 1. Large left perihilar and medial upper lobe mass with mediastinal invasion, consistent with primary lung cancer. Metastases in the left lower lobe measuring up to 7.5 cm. 2. Left upper lobe lymphangitic carcinomatosis. Bulky mediastinal and left hilar nodal metastases. 3. Near complete occlusion of the central left upper and lower lobe bronchi. Left upper lobe postobstructive pneumonia. 4. No evidence of pulmonary embolism. Narrowing of the left pulmonary arteries due to tumor. 5. 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 CT abdomen pelvis: 1. No evidence of metastatic disease in the abdomen or pelvis. Electronically Signed   By: Titus Dubin M.D.   On: 11/11/2020 21:37      IMPRESSION AND PLAN:  Active Problems:   CAP (community acquired pneumonia)  1.  Left-sided postobstructive pneumonia with highly suspicious left lung cancer. - The patient will be admitted to a medical monitored bed. - We will continue antibiotic therapy with IV Rocephin and Zithromax. - Mucolytic therapy and bronchodilator therapy will be provided. -  Pulmonary consultation will be obtained. - I notified Dr. Francesca Oman about the  patient.  2.  Acute asthma exacerbation due to his pneumonia. - We will continue steroid therapy as well as bronchodilator therapy.  The patient received IV magnesium sulfate. - O2 protocol will be followed.  3.  Tobacco abuse. - He was counseled for smoking cessation and will receive further counseling here.  4.  GERD. - We will continue PPI therapy.  DVT prophylaxis: Lovenox. Code Status: full code. Family Communication:  The plan of care was discussed in details with the patient (and his wife who was with him in the). I answered all questions. The patient agreed to proceed with the above mentioned plan. Further management will depend upon hospital course. Disposition Plan: Back to previous home environment Consults called: Pulmonary consult. All the records are reviewed and case discussed with ED provider.  Status is: Inpatient  Remains inpatient appropriate because:Ongoing diagnostic testing needed not appropriate for outpatient work up, Unsafe d/c plan, IV treatments appropriate due to intensity of illness or inability to take PO and Inpatient level of care appropriate due to severity of illness   Dispo: The patient is from: Home              Anticipated d/c is to: Home              Patient currently is not medically stable to d/c.   Difficult to place patient No   TOTAL TIME TAKING CARE OF THIS PATIENT: 55 minutes.    Christel Mormon M.D on 11/11/2020 at 10:34 PM  [facility]   From 7 PM-7 AM, contact night-coverage [email or web address]  CC: Primary care physician; Pcp, No

## 2020-11-11 NOTE — ED Triage Notes (Signed)
Pt states that he has been seen and treated over the past couple weeks with a diagnosis of pneumonia, states that he had 2 rounds of antibiotics that helped him feel better but states that his sob has cont

## 2020-11-11 NOTE — Consult Note (Signed)
PHARMACY -  BRIEF ANTIBIOTIC NOTE   Pharmacy has received consult(s) for cefepime, vancomycin from an ED provider.  The patient's profile has been reviewed for ht/wt/allergies/indication/available labs.    One time order(s) placed for   Cefepime 2 gram  Vancomycin 1500 mg   Further antibiotics/pharmacy consults should be ordered by admitting physician if indicated.                       Thank you, Dorothe Pea, PharmD, BCPS 11/11/2020  8:00 PM

## 2020-11-11 NOTE — ED Notes (Signed)
Wife updated as to patient's status.

## 2020-11-12 ENCOUNTER — Other Ambulatory Visit: Payer: Self-pay

## 2020-11-12 ENCOUNTER — Encounter: Payer: Self-pay | Admitting: Family Medicine

## 2020-11-12 LAB — BASIC METABOLIC PANEL
Anion gap: 9 (ref 5–15)
BUN: 9 mg/dL (ref 6–20)
CO2: 24 mmol/L (ref 22–32)
Calcium: 8.2 mg/dL — ABNORMAL LOW (ref 8.9–10.3)
Chloride: 101 mmol/L (ref 98–111)
Creatinine, Ser: 0.51 mg/dL — ABNORMAL LOW (ref 0.61–1.24)
GFR, Estimated: >60 mL/min (ref >60)
Glucose, Bld: 229 mg/dL — ABNORMAL HIGH (ref 70–99)
Potassium: 4.6 mmol/L (ref 3.5–5.1)
Sodium: 134 mmol/L — ABNORMAL LOW (ref 135–145)

## 2020-11-12 LAB — EXPECTORATED SPUTUM ASSESSMENT W GRAM STAIN, RFLX TO RESP C

## 2020-11-12 LAB — CBC
HCT: 38 % — ABNORMAL LOW (ref 39.0–52.0)
Hemoglobin: 12.9 g/dL — ABNORMAL LOW (ref 13.0–17.0)
MCH: 32.7 pg (ref 26.0–34.0)
MCHC: 33.9 g/dL (ref 30.0–36.0)
MCV: 96.4 fL (ref 80.0–100.0)
Platelets: 360 10*3/uL (ref 150–400)
RBC: 3.94 MIL/uL — ABNORMAL LOW (ref 4.22–5.81)
RDW: 12.8 % (ref 11.5–15.5)
WBC: 7.1 10*3/uL (ref 4.0–10.5)
nRBC: 0 % (ref 0.0–0.2)

## 2020-11-12 LAB — HIV ANTIBODY (ROUTINE TESTING W REFLEX): HIV Screen 4th Generation wRfx: NONREACTIVE

## 2020-11-12 LAB — STREP PNEUMONIAE URINARY ANTIGEN: Strep Pneumo Urinary Antigen: NEGATIVE

## 2020-11-12 LAB — PROCALCITONIN
Procalcitonin: <0.1 ng/mL
Procalcitonin: <0.1 ng/mL

## 2020-11-12 LAB — VITAMIN D 25 HYDROXY (VIT D DEFICIENCY, FRACTURES): Vit D, 25-Hydroxy: 9.96 ng/mL — ABNORMAL LOW (ref 30–100)

## 2020-11-12 LAB — CRYPTOCOCCAL ANTIGEN: Crypto Ag: NEGATIVE

## 2020-11-12 MED ORDER — PANTOPRAZOLE SODIUM 40 MG PO TBEC
40.0000 mg | DELAYED_RELEASE_TABLET | Freq: Every day | ORAL | Status: DC
  Administered 2020-11-13 – 2020-11-14 (×2): 40 mg via ORAL
  Filled 2020-11-12 (×2): qty 1

## 2020-11-12 MED ORDER — PREDNISONE 50 MG PO TABS
50.0000 mg | ORAL_TABLET | Freq: Every day | ORAL | Status: DC
  Administered 2020-11-13 – 2020-11-14 (×2): 50 mg via ORAL
  Filled 2020-11-12 (×2): qty 1

## 2020-11-12 MED ORDER — IPRATROPIUM-ALBUTEROL 0.5-2.5 (3) MG/3ML IN SOLN
3.0000 mL | RESPIRATORY_TRACT | Status: DC
  Administered 2020-11-12 – 2020-11-13 (×2): 3 mL via RESPIRATORY_TRACT
  Filled 2020-11-12 (×2): qty 3

## 2020-11-12 NOTE — Consult Note (Signed)
Pulmonary Medicine          Date: 11/12/2020,   MRN# [id number] Curtis Manning. 03/09/1971     AdmissionWeight: 63.5 kg                 CurrentWeight: 63.5 kg   Referring physician: Dr Billie Ruddy   CHIEF COMPLAINT:   Lung mass   HISTORY OF PRESENT ILLNESS   Curtis Bauer. is a 50 y.o. Caucasian male with medical history significant for asthma, ongoing tobacco abuse and GERD who presented to the emergency room with acute onset of significant worsening dyspnea with associated cough productive of clear sputum as well as wheezing.  The patient has been actually having symptoms for the last few weeks.  He was seen on an outpatient basis and was given 2 courses of antibiotics.  He had a chest x-ray that showed suspected lung mass and was supposed to be referred to pulmonology.  He admitted to weight loss of about 35 pounds over the last 2 to 3 months.  He has been having night sweats and fever.  No chest pain or palpitations.  No nausea or vomiting or abdominal pain.  No dysuria, regular hematuria or flank pain.  No bleeding diathesis.  Upon presentation to the ER rate was 136 with otherwise normal vital signs and later heart rate is 123.  Respiratory rate was initially normal and later 29.  Labs revealed hyponatremia 133 and albumin 2.9.  CBC showed minimal leukocytosis of 11.1 and thrombocytosis of 410.  Lactic acid was 1.3.  Influenza antigens and COVID-19 PCR came back negative.    He has been sick >1 month.  He is a smoker currently smokes trying to quit.   I reviewed CT chest independently.   I met with wife Curtis Manning and discussed findings.  Patient had additional questions which we discussed.   Patient has severe asthma, he was intubated and placed on mechanical ventilation at age 52.  He has been on prednisone recurrently.  He just finished prednisone taper  We discussed lung mass and biopsy.    PAST MEDICAL HISTORY   Past Medical History:  Diagnosis Date  . Asthma       SURGICAL HISTORY   He never had surgery in the past  FAMILY HISTORY   No family history on file.   SOCIAL HISTORY   Social History   Tobacco Use  . Smoking status: Current Every Day Smoker  . Smokeless tobacco: Never Used     MEDICATIONS    Home Medication:    Current Medication:  Current Facility-Administered Medications:  .  acetaminophen (TYLENOL) tablet 650 mg, 650 mg, Oral, Q6H PRN **OR** acetaminophen (TYLENOL) suppository 650 mg, 650 mg, Rectal, Q6H PRN, Mansy, Jan A, MD .  azithromycin (ZITHROMAX) 500 mg in sodium chloride 0.9 % 250 mL IVPB, 500 mg, Intravenous, Q24H, Mansy, Arvella Merles, MD, Stopped at 11/12/20 0158 .  cefTRIAXone (ROCEPHIN) 2 g in sodium chloride 0.9 % 100 mL IVPB, 2 g, Intravenous, Q24H, Mansy, Arvella Merles, MD, Stopped at 11/12/20 6378 .  dextromethorphan-guaiFENesin (MUCINEX DM) 30-600 MG per 12 hr tablet 1 tablet, 1 tablet, Oral, BID, Mansy, Jan A, MD, 1 tablet at 11/12/20 1001 .  dextromethorphan-guaiFENesin (MUCINEX DM) 30-600 MG per 12 hr tablet 1 tablet, 1 tablet, Oral, BID PRN, Mansy, Arvella Merles, MD, 1 tablet at 11/12/20 0016 .  enoxaparin (LOVENOX) injection 40 mg, 40 mg, Subcutaneous, Q24H, Mansy, Jan A, MD, 40 mg at 11/11/20 2355 .  ipratropium-albuterol (DUONEB) 0.5-2.5 (3) MG/3ML nebulizer solution 3 mL, 3 mL, Nebulization, QID, Mansy, Jan A, MD, 3 mL at 11/12/20 1114 .  magnesium hydroxide (MILK OF MAGNESIA) suspension 30 mL, 30 mL, Oral, Daily PRN, Mansy, Jan A, MD .  ondansetron (ZOFRAN) tablet 4 mg, 4 mg, Oral, Q6H PRN **OR** ondansetron (ZOFRAN) injection 4 mg, 4 mg, Intravenous, Q6H PRN, Mansy, Jan A, MD .  traZODone (DESYREL) tablet 25 mg, 25 mg, Oral, QHS PRN, Mansy, Arvella Merles, MD    ALLERGIES   Patient has no known allergies.     REVIEW OF SYSTEMS    Review of Systems:  Gen:  Denies  fever, sweats, chills weigh loss  HEENT: Denies blurred vision, double vision, ear pain, eye pain, hearing loss, nose bleeds, sore throat Cardiac:   No dizziness, chest pain or heaviness, chest tightness,edema Resp:   Denies cough or sputum porduction, shortness of breath,wheezing, hemoptysis,  Gi: Denies swallowing difficulty, stomach pain, nausea or vomiting, diarrhea, constipation, bowel incontinence Gu:  Denies bladder incontinence, burning urine Ext:   Denies Joint pain, stiffness or swelling Skin: Denies  skin rash, easy bruising or bleeding or hives Endoc:  Denies polyuria, polydipsia , polyphagia or weight change Psych:   Denies depression, insomnia or hallucinations   Other:  All other systems negative   VS: BP 109/66 (BP Location: Left Arm)   Pulse (!) 102   Temp 97.8 F (36.6 C)   Resp 20   Ht _0  (1.727 m)   Wt 63.5 kg   SpO2 98%   BMI 21.29 kg/m      PHYSICAL EXAM    GENERAL:NAD, no fevers, chills, no weakness no fatigue HEAD: Normocephalic, atraumatic.  EYES: Pupils equal, round, reactive to light. Extraocular muscles intact. No scleral icterus.  MOUTH: Moist mucosal membrane. Dentition intact. No abscess noted.  EAR, NOSE, THROAT: Clear without exudates. No external lesions.  NECK: Supple. No thyromegaly. No nodules. No JVD.  PULMONARY: bilateral rhonchi and wheezing. CARDIOVASCULAR: S1 and S2. Regular rate and rhythm. No murmurs, rubs, or gallops. No edema. Pedal pulses 2+ bilaterally.  GASTROINTESTINAL: Soft, nontender, nondistended. No masses. Positive bowel sounds. No hepatosplenomegaly.  MUSCULOSKELETAL: No swelling, clubbing, or edema. Range of motion full in all extremities.  NEUROLOGIC: Cranial nerves II through XII are intact. No gross focal neurological deficits. Sensation intact. Reflexes intact.  SKIN: No ulceration, lesions, rashes, or cyanosis. Skin warm and dry. Turgor intact.  PSYCHIATRIC: Mood, affect within normal limits. The patient is awake, alert and oriented x 3. Insight, judgment intact.       IMAGING    DG Chest 2 View  Result Date: 11/11/2020 CLINICAL DATA:  Shortness of  breath EXAM: CHEST - 2 VIEW COMPARISON:  None. FINDINGS: There is an area that appears masslike in the posterior segment of the left lower lobe measuring 6.8 x 6.5 x 6.4 cm. There is extensive airspace opacity throughout the left upper lobe as well. A small area of infiltrate is noted more inferiorly on the left. Right lung is clear. Heart size and pulmonary vascularity normal. Questionable adenopathy left hilum. No bone lesions. IMPRESSION: Suspected neoplastic focus in the superior segment left lower lobe measuring 6.8 x 6.5 x 6.4 cm. Apparent pneumonia throughout the left upper lobe. Ill-defined opacity left lower lobe may represent focal pneumonia, although on the frontal view, this area appears masslike and could represent a second potential neoplasm. This area on frontal view measures 3.7 x 3.1 cm. Advise chest CT for further  assessment. Right lung clear. Heart size normal. Suspected left hilar adenopathy. Electronically Signed   By: Lowella Grip III M.D.   On: 11/11/2020 17:41   CT Angio Chest PE W and/or Wo Contrast  Result Date: 11/11/2020 CLINICAL DATA:  Lung mass. EXAM: CT ANGIOGRAPHY CHEST CT ABDOMEN AND PELVIS WITH CONTRAST TECHNIQUE: Multidetector CT imaging of the chest was performed using the standard protocol during bolus administration of intravenous contrast. Multiplanar CT image reconstructions and MIPs were obtained to evaluate the vascular anatomy. Multidetector CT imaging of the abdomen and pelvis was performed using the standard protocol during bolus administration of intravenous contrast. CONTRAST:  64m OMNIPAQUE IOHEXOL 350 MG/ML SOLN COMPARISON:  Chest x-ray from same day. FINDINGS: CTA CHEST FINDINGS Cardiovascular: Satisfactory opacification of the pulmonary arteries to the segmental level. No evidence of pulmonary embolism. Narrowing of the left main, lobar, and segmental pulmonary arteries due to tumor. Normal heart size. No pericardial effusion. Mediastinum/Nodes: Bulky  mediastinal left hilar lymphadenopathy. Index prevascular lymph node measures 2.2 cm in short axis. Conglomerate subcarinal and left hilar lymphadenopathy and/or tumor, difficult to measure. Lower paraesophageal lymph node measuring 9 mm in short axis (series 4, image 81). Prominent left cardiophrenic angle lymph node measuring 7 mm in short axis (series 4, image 96). No pathologically enlarged right hilar or axillary lymph nodes. The thyroid gland, trachea, and esophagus demonstrate no significant findings. Lungs/Pleura: Large left perihilar and medial upper lobe mass, difficult to measure, with near complete occlusion of the central left upper and lower lobe bronchi. 7.5 x 4.1 cm mass predominantly within the superior segment of the left lower lobe, but crossing the major fissure. 1.2 x 2.6 cm subpleural nodule in the anterior left lower lobe (series 4, image 73). Patchy consolidation in the left upper lobe due to post obstructive pneumonia. Nodular interlobular septal thickening in the left upper lobe. Nodularity along the left major fissure superiorly. Small to moderate partially loculated left pleural effusion. The right lung is clear. No pneumothorax. Musculoskeletal: No chest wall abnormality. No acute or significant osseous findings. Review of the MIP images confirms the above findings. CT ABDOMEN AND PELVIS FINDINGS Hepatobiliary: Few scattered subcentimeter low-density lesions within the liver, too small to characterize. The gallbladder is unremarkable. No biliary dilatation. Pancreas: Unremarkable. No pancreatic ductal dilatation or surrounding inflammatory changes. Spleen: Normal in size without focal abnormality. Adrenals/Urinary Tract: Adrenal glands are unremarkable. 1.3 cm left renal simple cyst. No renal calculi or hydronephrosis. The bladder is under distended. Stomach/Bowel: Stomach is within normal limits. Appendix appears normal. No evidence of bowel wall thickening, distention, or inflammatory  changes. Vascular/Lymphatic: No significant vascular findings are present. No enlarged abdominal or pelvic lymph nodes. Reproductive: Prostate is unremarkable. Other: Small fat containing left inguinal hernia. No free fluid or pneumoperitoneum. Musculoskeletal: No acute or significant osseous findings. Review of the MIP images confirms the above findings. IMPRESSION: CT chest: 1. Large left perihilar and medial upper lobe mass with mediastinal invasion, consistent with primary lung cancer. Metastases in the left lower lobe measuring up to 7.5 cm. 2. Left upper lobe lymphangitic carcinomatosis. Bulky mediastinal and left hilar nodal metastases. 3. Near complete occlusion of the central left upper and lower lobe bronchi. Left upper lobe postobstructive pneumonia. 4. No evidence of pulmonary embolism. Narrowing of the left pulmonary arteries due to tumor. 5. 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 CT abdomen pelvis: 1. No evidence of metastatic disease in the abdomen or pelvis. Electronically Signed   By: WHuntley Dec  Derry M.D.   On: 11/11/2020 21:37   CT ABDOMEN PELVIS W CONTRAST  Result Date: 11/11/2020 CLINICAL DATA:  Lung mass. EXAM: CT ANGIOGRAPHY CHEST CT ABDOMEN AND PELVIS WITH CONTRAST TECHNIQUE: Multidetector CT imaging of the chest was performed using the standard protocol during bolus administration of intravenous contrast. Multiplanar CT image reconstructions and MIPs were obtained to evaluate the vascular anatomy. Multidetector CT imaging of the abdomen and pelvis was performed using the standard protocol during bolus administration of intravenous contrast. CONTRAST:  102m OMNIPAQUE IOHEXOL 350 MG/ML SOLN COMPARISON:  Chest x-ray from same day. FINDINGS: CTA CHEST FINDINGS Cardiovascular: Satisfactory opacification of the pulmonary arteries to the segmental level. No evidence of pulmonary embolism. Narrowing of the left main, lobar, and segmental pulmonary arteries due to  tumor. Normal heart size. No pericardial effusion. Mediastinum/Nodes: Bulky mediastinal left hilar lymphadenopathy. Index prevascular lymph node measures 2.2 cm in short axis. Conglomerate subcarinal and left hilar lymphadenopathy and/or tumor, difficult to measure. Lower paraesophageal lymph node measuring 9 mm in short axis (series 4, image 81). Prominent left cardiophrenic angle lymph node measuring 7 mm in short axis (series 4, image 96). No pathologically enlarged right hilar or axillary lymph nodes. The thyroid gland, trachea, and esophagus demonstrate no significant findings. Lungs/Pleura: Large left perihilar and medial upper lobe mass, difficult to measure, with near complete occlusion of the central left upper and lower lobe bronchi. 7.5 x 4.1 cm mass predominantly within the superior segment of the left lower lobe, but crossing the major fissure. 1.2 x 2.6 cm subpleural nodule in the anterior left lower lobe (series 4, image 73). Patchy consolidation in the left upper lobe due to post obstructive pneumonia. Nodular interlobular septal thickening in the left upper lobe. Nodularity along the left major fissure superiorly. Small to moderate partially loculated left pleural effusion. The right lung is clear. No pneumothorax. Musculoskeletal: No chest wall abnormality. No acute or significant osseous findings. Review of the MIP images confirms the above findings. CT ABDOMEN AND PELVIS FINDINGS Hepatobiliary: Few scattered subcentimeter low-density lesions within the liver, too small to characterize. The gallbladder is unremarkable. No biliary dilatation. Pancreas: Unremarkable. No pancreatic ductal dilatation or surrounding inflammatory changes. Spleen: Normal in size without focal abnormality. Adrenals/Urinary Tract: Adrenal glands are unremarkable. 1.3 cm left renal simple cyst. No renal calculi or hydronephrosis. The bladder is under distended. Stomach/Bowel: Stomach is within normal limits. Appendix  appears normal. No evidence of bowel wall thickening, distention, or inflammatory changes. Vascular/Lymphatic: No significant vascular findings are present. No enlarged abdominal or pelvic lymph nodes. Reproductive: Prostate is unremarkable. Other: Small fat containing left inguinal hernia. No free fluid or pneumoperitoneum. Musculoskeletal: No acute or significant osseous findings. Review of the MIP images confirms the above findings. IMPRESSION: CT chest: 1. Large left perihilar and medial upper lobe mass with mediastinal invasion, consistent with primary lung cancer. Metastases in the left lower lobe measuring up to 7.5 cm. 2. Left upper lobe lymphangitic carcinomatosis. Bulky mediastinal and left hilar nodal metastases. 3. Near complete occlusion of the central left upper and lower lobe bronchi. Left upper lobe postobstructive pneumonia. 4. No evidence of pulmonary embolism. Narrowing of the left pulmonary arteries due to tumor. 5. 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 CT abdomen pelvis: 1. No evidence of metastatic disease in the abdomen or pelvis. Electronically Signed   By: WTitus DubinM.D.   On: 11/11/2020 21:37      ASSESSMENT/PLAN   Left lung pneumonia with lung mass -patient  is chronic smoker, has family history of gastric cancer in paternal grandmother.   -he has constitutional symptoms for appx 3 months -he has not had hemoptysis -he is on empiric therapy  -clinically improved on zithromax and rocephin with solumedrol - present on admission  - COVID19 -never had infection , he had 3 vaccinations  - supplemental O2 during my evaluation room air - will perform infectious workup for pneumonial -serum fungitell -legionella ab -strep pneumoniae ur AG -Histoplasma Ur Ag -sputum resp cultures -AFB sputum expectorated specimen -sputum cytology  -reviewed pertinent imaging with patient today - ESR -PT/OT for d/c planning  -please encourage patient  to use incentive spirometer few times each hour while hospitalized.    Probable COPD  - patient is on duoneb and steroids   - will continue prednisone 75m with taper   Left lung mass   - will perform infectious workup for fungal and atypical mycobacterial etiology  - cytology sputum stat  - discussed biopsy with patient he is agreeable.    Thank you for allowing me to participate in the care of this patient.    Patient/Family are satisfied with care plan and all questions have been answered.  This document was prepared using Dragon voice recognition software and may include unintentional dictation errors.     FOttie Glazier M.D.  Division of [facility]

## 2020-11-12 NOTE — Progress Notes (Addendum)
PROGRESS NOTE    Curtis Manning.  HER:[id number] DOB: Dec 07, 1970 DOA: 11/11/2020 PCP: Pcp, No  134A/134A-AA   Assessment & Plan:   Active Problems:   CAP (community acquired pneumonia)   Curtis Manning. is a 50 y.o. Caucasian male with medical history significant for asthma, ongoing tobacco abuseand GERD who presented to the emergency room with acute onset of significant worsening dyspnea with associated cough productive of clear sputum as well as wheezing.  The patient has been actually having symptoms for the last few weeks.  He was seen on an outpatient basis and was given 2 courses of antibiotics.  He had a chest x-ray that showed suspected lung mass and was supposed to be referred to pulmonology.  He admitted to weight loss of about 35 pounds over the last 2 to 3 months.  He has been having night sweats and fever.     1.  Left-sided postobstructive pneumonia with highly suspicious left lung mass. --chronic smoker.  Has constitutional symptoms for ~3 months.   - started on IV Rocephin and Zithromax Plan: --Pulm consult, Dr. Lanney Gins to see --cont ceftriaxone and azithromycin --cont Mucinex --further workup per Pulm  2.  Probable COPD with acute exacerbation - started on IV solumedrol and DuoNeb, ceftriaxone and azithromycin --currently on room air Plan: --pulm consult, Dr. Keenan Bachelor to see --cont steroid as prednisone 50 mg daily --cont DuoNeb q4h while awake  3.  Tobacco abuse. --cessation advised  4.  GERD. --cont home PPI   DVT prophylaxis: Lovenox SQ Code Status: Full code  Family Communication: wife updated at bedside today Level of care: Med-Surg Dispo:   The patient is from: home Anticipated d/c is to: home Anticipated d/c date is: 1-2 days Patient currently is not medically ready to d/c due to: pending pulm workup    Subjective and Interval History:  Pt reported breathing better, no pain.  On room air.   Objective: Vitals:   11/12/20 0214  11/12/20 0753 11/12/20 1548 11/12/20 1646  BP: 101/68 109/66 109/71 110/65  Pulse: (!) 109 (!) 102 (!) 113 (!) 108  Resp: 18 20 20 20   Temp: 98.3 F (36.8 C) 97.8 F (36.6 C) 98.2 F (36.8 C)   TempSrc:      SpO2: 97% 98% 97% 96%  Weight:      Height:        Intake/Output Summary (Last 24 hours) at 11/12/2020 1715 Last data filed at 11/12/2020 1347 Gross per 24 hour  Intake 1494.58 ml  Output --  Net 1494.58 ml   Filed Weights   11/11/20 1657  Weight: 63.5 kg    Examination:   Constitutional: NAD, AAOx3 HEENT: conjunctivae and lids normal, EOMI CV: No cyanosis.   RESP: whistling airway sounds on inspiration, on RA Extremities: No effusions, edema in BLE SKIN: warm, dry Neuro: II - XII grossly intact.   Psych: Normal mood and affect.  Appropriate judgement and reason   Data Reviewed: I have personally reviewed following labs and imaging studies  CBC: Recent Labs  Lab 11/11/20 1702 11/12/20 0439  WBC 11.1* 7.1  HGB 14.9 12.9*  HCT 43.2 38.0*  MCV 94.9 96.4  PLT 410* 856   Basic Metabolic Panel: Recent Labs  Lab 11/11/20 1702 11/12/20 0439  NA 133* 134*  K 4.3 4.6  CL 99 101  CO2 23 24  GLUCOSE 124* 229*  BUN 9 9  CREATININE 0.44* 0.51*  CALCIUM 9.0 8.2*   GFR: Estimated Creatinine Clearance: 99.2 mL/min (  A) (by C-G formula based on SCr of 0.51 mg/dL (L)). Liver Function Tests: Recent Labs  Lab 11/11/20 1702  AST 25  ALT 13  ALKPHOS 76  BILITOT 0.5  PROT 6.9  ALBUMIN 2.9*   No results for input(s): LIPASE, AMYLASE in the last 168 hours. No results for input(s): AMMONIA in the last 168 hours. Coagulation Profile: No results for input(s): INR, PROTIME in the last 168 hours. Cardiac Enzymes: No results for input(s): CKTOTAL, CKMB, CKMBINDEX, TROPONINI in the last 168 hours. BNP (last 3 results) No results for input(s): PROBNP in the last 8760 hours. HbA1C: No results for input(s): HGBA1C in the last 72 hours. CBG: No results for  input(s): GLUCAP in the last 168 hours. Lipid Profile: No results for input(s): CHOL, HDL, LDLCALC, TRIG, CHOLHDL, LDLDIRECT in the last 72 hours. Thyroid Function Tests: No results for input(s): TSH, T4TOTAL, FREET4, T3FREE, THYROIDAB in the last 72 hours. Anemia Panel: No results for input(s): VITAMINB12, FOLATE, FERRITIN, TIBC, IRON, RETICCTPCT in the last 72 hours. Sepsis Labs: Recent Labs  Lab 11/11/20 2036 11/12/20 0439 11/12/20 1329  PROCALCITON  --  <0.10 <0.10  LATICACIDVEN 1.3  --   --     Recent Results (from the past 240 hour(s))  Culture, blood (routine x 2)     Status: None (Preliminary result)   Collection Time: 11/11/20  8:36 PM   Specimen: BLOOD  Result Value Ref Range Status   Specimen Description BLOOD BLOOD RIGHT HAND  Final   Special Requests   Final    BOTTLES DRAWN AEROBIC AND ANAEROBIC Blood Culture results may not be optimal due to an inadequate volume of blood received in culture bottles   Culture   Final    NO GROWTH < 12 HOURS Performed at [facility], [address], [place], [place] [address]    Report Status PENDING  Incomplete  Culture, blood (routine x 2)     Status: None (Preliminary result)   Collection Time: 11/11/20  8:36 PM   Specimen: BLOOD  Result Value Ref Range Status   Specimen Description BLOOD BLOOD RIGHT FOREARM  Final   Special Requests   Final    BOTTLES DRAWN AEROBIC AND ANAEROBIC Blood Culture results may not be optimal due to an inadequate volume of blood received in culture bottles   Culture   Final    NO GROWTH < 12 HOURS Performed at [facility], [address], [place], [place] [address]    Report Status PENDING  Incomplete  Resp Panel by RT-PCR (Flu A&B, Covid) Nasopharyngeal Swab     Status: None   Collection Time: 11/11/20  8:36 PM   Specimen: Nasopharyngeal Swab; Nasopharyngeal(NP) swabs in vial transport medium  Result Value Ref Range Status   SARS Coronavirus 2 by RT PCR NEGATIVE NEGATIVE  Final    Comment: (NOTE) SARS-CoV-2 target nucleic acids are NOT DETECTED.  The SARS-CoV-2 RNA is generally detectable in upper respiratory specimens during the acute phase of infection. The lowest concentration of SARS-CoV-2 viral copies this assay can detect is 138 copies/mL. A negative result does not preclude SARS-Cov-2 infection and should not be used as the sole basis for treatment or other patient management decisions. A negative result may occur with  improper specimen collection/handling, submission of specimen other than nasopharyngeal swab, presence of viral mutation(s) within the areas targeted by this assay, and inadequate number of viral copies(<138 copies/mL). A negative result must be combined with clinical observations, patient history, and  epidemiological information. The expected result is Negative.  Fact Sheet for Patients:  EntrepreneurPulse.com.au  Fact Sheet for Healthcare Providers:  IncredibleEmployment.be  This test is no t yet approved or cleared by the [place] FDA and  has been authorized for detection and/or diagnosis of SARS-CoV-2 by FDA under an Emergency Use Authorization (EUA). This EUA will remain  in effect (meaning this test can be used) for the duration of the COVID-19 declaration under Section 564(b)(1) of the Act, 21 U.S.C.section 360bbb-3(b)(1), unless the authorization is terminated  or revoked sooner.       Influenza A by PCR NEGATIVE NEGATIVE Final   Influenza B by PCR NEGATIVE NEGATIVE Final    Comment: (NOTE) The Xpert Xpress SARS-CoV-2/FLU/RSV plus assay is intended as an aid in the diagnosis of influenza from Nasopharyngeal swab specimens and should not be used as a sole basis for treatment. Nasal washings and aspirates are unacceptable for Xpert Xpress SARS-CoV-2/FLU/RSV testing.  Fact Sheet for Patients: EntrepreneurPulse.com.au  Fact Sheet for Healthcare  Providers: IncredibleEmployment.be  This test is not yet approved or cleared by the [place] FDA and has been authorized for detection and/or diagnosis of SARS-CoV-2 by FDA under an Emergency Use Authorization (EUA). This EUA will remain in effect (meaning this test can be used) for the duration of the COVID-19 declaration under Section 564(b)(1) of the Act, 21 U.S.C. section 360bbb-3(b)(1), unless the authorization is terminated or revoked.  Performed at [facility], [address] [place], [place] [address]   Culture, sputum-assessment     Status: None   Collection Time: 11/12/20  4:40 AM   Specimen: Sputum  Result Value Ref Range Status   Specimen Description SPUTUM  Final   Special Requests NONE  Final   Sputum evaluation   Final    THIS SPECIMEN IS ACCEPTABLE FOR SPUTUM CULTURE Performed at [facility], [address] [place], [place] [address]    Report Status 11/12/2020 FINAL  Final      Radiology Studies: DG Chest 2 View  Result Date: 11/11/2020 CLINICAL DATA:  Shortness of breath EXAM: CHEST - 2 VIEW COMPARISON:  None. FINDINGS: There is an area that appears masslike in the posterior segment of the left lower lobe measuring 6.8 x 6.5 x 6.4 cm. There is extensive airspace opacity throughout the left upper lobe as well. A small area of infiltrate is noted more inferiorly on the left. Right lung is clear. Heart size and pulmonary vascularity normal. Questionable adenopathy left hilum. No bone lesions. IMPRESSION: Suspected neoplastic focus in the superior segment left lower lobe measuring 6.8 x 6.5 x 6.4 cm. Apparent pneumonia throughout the left upper lobe. Ill-defined opacity left lower lobe may represent focal pneumonia, although on the frontal view, this area appears masslike and could represent a second potential neoplasm. This area on frontal view measures 3.7 x 3.1 cm. Advise chest CT for further assessment. Right lung clear.  Heart size normal. Suspected left hilar adenopathy. Electronically Signed   By: Lowella Grip III M.D.   On: 11/11/2020 17:41   CT Angio Chest PE W and/or Wo Contrast  Result Date: 11/11/2020 CLINICAL DATA:  Lung mass. EXAM: CT ANGIOGRAPHY CHEST CT ABDOMEN AND PELVIS WITH CONTRAST TECHNIQUE: Multidetector CT imaging of the chest was performed using the standard protocol during bolus administration of intravenous contrast. Multiplanar CT image reconstructions and MIPs were obtained to evaluate the vascular anatomy. Multidetector CT imaging of the abdomen and pelvis was performed using the standard protocol during  bolus administration of intravenous contrast. CONTRAST:  27mL OMNIPAQUE IOHEXOL 350 MG/ML SOLN COMPARISON:  Chest x-ray from same day. FINDINGS: CTA CHEST FINDINGS Cardiovascular: Satisfactory opacification of the pulmonary arteries to the segmental level. No evidence of pulmonary embolism. Narrowing of the left main, lobar, and segmental pulmonary arteries due to tumor. Normal heart size. No pericardial effusion. Mediastinum/Nodes: Bulky mediastinal left hilar lymphadenopathy. Index prevascular lymph node measures 2.2 cm in short axis. Conglomerate subcarinal and left hilar lymphadenopathy and/or tumor, difficult to measure. Lower paraesophageal lymph node measuring 9 mm in short axis (series 4, image 81). Prominent left cardiophrenic angle lymph node measuring 7 mm in short axis (series 4, image 96). No pathologically enlarged right hilar or axillary lymph nodes. The thyroid gland, trachea, and esophagus demonstrate no significant findings. Lungs/Pleura: Large left perihilar and medial upper lobe mass, difficult to measure, with near complete occlusion of the central left upper and lower lobe bronchi. 7.5 x 4.1 cm mass predominantly within the superior segment of the left lower lobe, but crossing the major fissure. 1.2 x 2.6 cm subpleural nodule in the anterior left lower lobe (series 4, image 73).  Patchy consolidation in the left upper lobe due to post obstructive pneumonia. Nodular interlobular septal thickening in the left upper lobe. Nodularity along the left major fissure superiorly. Small to moderate partially loculated left pleural effusion. The right lung is clear. No pneumothorax. Musculoskeletal: No chest wall abnormality. No acute or significant osseous findings. Review of the MIP images confirms the above findings. CT ABDOMEN AND PELVIS FINDINGS Hepatobiliary: Few scattered subcentimeter low-density lesions within the liver, too small to characterize. The gallbladder is unremarkable. No biliary dilatation. Pancreas: Unremarkable. No pancreatic ductal dilatation or surrounding inflammatory changes. Spleen: Normal in size without focal abnormality. Adrenals/Urinary Tract: Adrenal glands are unremarkable. 1.3 cm left renal simple cyst. No renal calculi or hydronephrosis. The bladder is under distended. Stomach/Bowel: Stomach is within normal limits. Appendix appears normal. No evidence of bowel wall thickening, distention, or inflammatory changes. Vascular/Lymphatic: No significant vascular findings are present. No enlarged abdominal or pelvic lymph nodes. Reproductive: Prostate is unremarkable. Other: Small fat containing left inguinal hernia. No free fluid or pneumoperitoneum. Musculoskeletal: No acute or significant osseous findings. Review of the MIP images confirms the above findings. IMPRESSION: CT chest: 1. Large left perihilar and medial upper lobe mass with mediastinal invasion, consistent with primary lung cancer. Metastases in the left lower lobe measuring up to 7.5 cm. 2. Left upper lobe lymphangitic carcinomatosis. Bulky mediastinal and left hilar nodal metastases. 3. Near complete occlusion of the central left upper and lower lobe bronchi. Left upper lobe postobstructive pneumonia. 4. No evidence of pulmonary embolism. Narrowing of the left pulmonary arteries due to tumor. 5. 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 CT abdomen pelvis: 1. No evidence of metastatic disease in the abdomen or pelvis. Electronically Signed   By: Titus Dubin M.D.   On: 11/11/2020 21:37   CT ABDOMEN PELVIS W CONTRAST  Result Date: 11/11/2020 CLINICAL DATA:  Lung mass. EXAM: CT ANGIOGRAPHY CHEST CT ABDOMEN AND PELVIS WITH CONTRAST TECHNIQUE: Multidetector CT imaging of the chest was performed using the standard protocol during bolus administration of intravenous contrast. Multiplanar CT image reconstructions and MIPs were obtained to evaluate the vascular anatomy. Multidetector CT imaging of the abdomen and pelvis was performed using the standard protocol during bolus administration of intravenous contrast. CONTRAST:  32mL OMNIPAQUE IOHEXOL 350 MG/ML SOLN COMPARISON:  Chest x-ray from same day. FINDINGS: CTA  CHEST FINDINGS Cardiovascular: Satisfactory opacification of the pulmonary arteries to the segmental level. No evidence of pulmonary embolism. Narrowing of the left main, lobar, and segmental pulmonary arteries due to tumor. Normal heart size. No pericardial effusion. Mediastinum/Nodes: Bulky mediastinal left hilar lymphadenopathy. Index prevascular lymph node measures 2.2 cm in short axis. Conglomerate subcarinal and left hilar lymphadenopathy and/or tumor, difficult to measure. Lower paraesophageal lymph node measuring 9 mm in short axis (series 4, image 81). Prominent left cardiophrenic angle lymph node measuring 7 mm in short axis (series 4, image 96). No pathologically enlarged right hilar or axillary lymph nodes. The thyroid gland, trachea, and esophagus demonstrate no significant findings. Lungs/Pleura: Large left perihilar and medial upper lobe mass, difficult to measure, with near complete occlusion of the central left upper and lower lobe bronchi. 7.5 x 4.1 cm mass predominantly within the superior segment of the left lower lobe, but crossing the major fissure. 1.2 x  2.6 cm subpleural nodule in the anterior left lower lobe (series 4, image 73). Patchy consolidation in the left upper lobe due to post obstructive pneumonia. Nodular interlobular septal thickening in the left upper lobe. Nodularity along the left major fissure superiorly. Small to moderate partially loculated left pleural effusion. The right lung is clear. No pneumothorax. Musculoskeletal: No chest wall abnormality. No acute or significant osseous findings. Review of the MIP images confirms the above findings. CT ABDOMEN AND PELVIS FINDINGS Hepatobiliary: Few scattered subcentimeter low-density lesions within the liver, too small to characterize. The gallbladder is unremarkable. No biliary dilatation. Pancreas: Unremarkable. No pancreatic ductal dilatation or surrounding inflammatory changes. Spleen: Normal in size without focal abnormality. Adrenals/Urinary Tract: Adrenal glands are unremarkable. 1.3 cm left renal simple cyst. No renal calculi or hydronephrosis. The bladder is under distended. Stomach/Bowel: Stomach is within normal limits. Appendix appears normal. No evidence of bowel wall thickening, distention, or inflammatory changes. Vascular/Lymphatic: No significant vascular findings are present. No enlarged abdominal or pelvic lymph nodes. Reproductive: Prostate is unremarkable. Other: Small fat containing left inguinal hernia. No free fluid or pneumoperitoneum. Musculoskeletal: No acute or significant osseous findings. Review of the MIP images confirms the above findings. IMPRESSION: CT chest: 1. Large left perihilar and medial upper lobe mass with mediastinal invasion, consistent with primary lung cancer. Metastases in the left lower lobe measuring up to 7.5 cm. 2. Left upper lobe lymphangitic carcinomatosis. Bulky mediastinal and left hilar nodal metastases. 3. Near complete occlusion of the central left upper and lower lobe bronchi. Left upper lobe postobstructive pneumonia. 4. No evidence of pulmonary  embolism. Narrowing of the left pulmonary arteries due to tumor. 5. 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 CT abdomen pelvis: 1. No evidence of metastatic disease in the abdomen or pelvis. Electronically Signed   By: Titus Dubin M.D.   On: 11/11/2020 21:37     Scheduled Meds: . dextromethorphan-guaiFENesin  1 tablet Oral BID  . enoxaparin (LOVENOX) injection  40 mg Subcutaneous Q24H  . ipratropium-albuterol  3 mL Nebulization QID  . [START ON 11/13/2020] predniSONE  50 mg Oral Q breakfast   Continuous Infusions: . azithromycin Stopped (11/12/20 0158)  . cefTRIAXone (ROCEPHIN)  IV Stopped (11/12/20 9449)     LOS: 1 day     Enzo Bi, MD [facility] If 7PM-7AM, please contact night-coverage 11/12/2020, 5:15 PM

## 2020-11-12 NOTE — Plan of Care (Signed)

## 2020-11-13 LAB — BASIC METABOLIC PANEL
Anion gap: 11 (ref 5–15)
BUN: 11 mg/dL (ref 6–20)
CO2: 25 mmol/L (ref 22–32)
Calcium: 8.7 mg/dL — ABNORMAL LOW (ref 8.9–10.3)
Chloride: 105 mmol/L (ref 98–111)
Creatinine, Ser: 0.61 mg/dL (ref 0.61–1.24)
GFR, Estimated: >60 mL/min (ref >60)
Glucose, Bld: 101 mg/dL — ABNORMAL HIGH (ref 70–99)
Potassium: 3.9 mmol/L (ref 3.5–5.1)
Sodium: 141 mmol/L (ref 135–145)

## 2020-11-13 LAB — MAGNESIUM: Magnesium: 1.9 mg/dL (ref 1.7–2.4)

## 2020-11-13 LAB — CBC
HCT: 40.3 % (ref 39.0–52.0)
Hemoglobin: 13.5 g/dL (ref 13.0–17.0)
MCH: 31.9 pg (ref 26.0–34.0)
MCHC: 33.5 g/dL (ref 30.0–36.0)
MCV: 95.3 fL (ref 80.0–100.0)
Platelets: 407 10*3/uL — ABNORMAL HIGH (ref 150–400)
RBC: 4.23 MIL/uL (ref 4.22–5.81)
RDW: 13 % (ref 11.5–15.5)
WBC: 11.8 10*3/uL — ABNORMAL HIGH (ref 4.0–10.5)
nRBC: 0 % (ref 0.0–0.2)

## 2020-11-13 LAB — STREP PNEUMONIAE URINARY ANTIGEN: Strep Pneumo Urinary Antigen: NEGATIVE

## 2020-11-13 MED ORDER — IPRATROPIUM-ALBUTEROL 0.5-2.5 (3) MG/3ML IN SOLN
3.0000 mL | Freq: Three times a day (TID) | RESPIRATORY_TRACT | Status: DC
  Administered 2020-11-13 – 2020-11-14 (×4): 3 mL via RESPIRATORY_TRACT
  Filled 2020-11-13 (×4): qty 3

## 2020-11-13 NOTE — Progress Notes (Signed)
PROGRESS NOTE    Curtis Manning.  JKD:[id number] DOB: 1971-06-28 DOA: 11/11/2020 PCP: Pcp, No  134A/134A-AA   Assessment & Plan:   Active Problems:   CAP (community acquired pneumonia)   Curtis Manning. is a 50 y.o. Caucasian male with medical history significant for asthma, ongoing tobacco abuseand GERD who presented to the emergency room with acute onset of significant worsening dyspnea with associated cough productive of clear sputum as well as wheezing.  The patient has been actually having symptoms for the last few weeks.  He was seen on an outpatient basis and was given 2 courses of antibiotics.  He had a chest x-ray that showed suspected lung mass and was supposed to be referred to pulmonology.  He admitted to weight loss of about 35 pounds over the last 2 to 3 months.  He has been having night sweats and fever.     1.  Left-sided postobstructive pneumonia with highly suspicious left lung mass. --chronic smoker.  Has constitutional symptoms for ~3 months.   - started on IV Rocephin and Zithromax --Pulm consulted, Dr. Lanney Gins Plan: --cont ceftriaxone and azithro --cont Mucinex --further workup per Pulm  2.  Probable COPD with acute exacerbation - started on IV solumedrol and DuoNeb, ceftriaxone and azithromycin --currently on room air Plan: --cont prednisone 50 mg daily --cont DuoNeb   3.  Tobacco abuse. --cessation advised  4.  GERD. --cont home PPI   DVT prophylaxis: Lovenox SQ Code Status: Full code  Family Communication:  Level of care: Med-Surg Dispo:   The patient is from: home Anticipated d/c is to: home Anticipated d/c date is: 1-2 days Patient currently is not medically ready to d/c due to: pending pulm workup    Subjective and Interval History:  Pt reported doing fine.  Breathing improved, on room air.   Objective: Vitals:   11/12/20 2123 11/13/20 0415 11/13/20 0806 11/13/20 1214  BP: 111/74 102/70 119/77 117/73  Pulse: (!) 109 79  (!) 106 (!) 109  Resp: 15 16 18 20   Temp: 98.6 F (37 C) 98.1 F (36.7 C) 98.4 F (36.9 C) 98.3 F (36.8 C)  TempSrc:      SpO2: 97% 97% 97% 97%  Weight:      Height:        Intake/Output Summary (Last 24 hours) at 11/13/2020 1444 Last data filed at 11/13/2020 1009 Gross per 24 hour  Intake 600 ml  Output --  Net 600 ml   Filed Weights   11/11/20 1657  Weight: 63.5 kg    Examination:   Constitutional: NAD, AAOx3 HEENT: conjunctivae and lids normal, EOMI CV: No cyanosis.   RESP: normal respiratory effort, on RA Extremities: No effusions, edema in BLE SKIN: warm, dry Neuro: II - XII grossly intact.   Psych: Normal mood and affect.  Appropriate judgement and reason   Data Reviewed: I have personally reviewed following labs and imaging studies  CBC: Recent Labs  Lab 11/11/20 1702 11/12/20 0439 11/13/20 0428  WBC 11.1* 7.1 11.8*  HGB 14.9 12.9* 13.5  HCT 43.2 38.0* 40.3  MCV 94.9 96.4 95.3  PLT 410* 360 099*   Basic Metabolic Panel: Recent Labs  Lab 11/11/20 1702 11/12/20 0439 11/13/20 0428  NA 133* 134* 141  K 4.3 4.6 3.9  CL 99 101 105  CO2 23 24 25   GLUCOSE 124* 229* 101*  BUN 9 9 11   CREATININE 0.44* 0.51* 0.61  CALCIUM 9.0 8.2* 8.7*  MG  --   --  1.9   GFR: Estimated Creatinine Clearance: 99.2 mL/min (by C-G formula based on SCr of 0.61 mg/dL). Liver Function Tests: Recent Labs  Lab 11/11/20 1702  AST 25  ALT 13  ALKPHOS 76  BILITOT 0.5  PROT 6.9  ALBUMIN 2.9*   No results for input(s): LIPASE, AMYLASE in the last 168 hours. No results for input(s): AMMONIA in the last 168 hours. Coagulation Profile: No results for input(s): INR, PROTIME in the last 168 hours. Cardiac Enzymes: No results for input(s): CKTOTAL, CKMB, CKMBINDEX, TROPONINI in the last 168 hours. BNP (last 3 results) No results for input(s): PROBNP in the last 8760 hours. HbA1C: No results for input(s): HGBA1C in the last 72 hours. CBG: No results for input(s):  GLUCAP in the last 168 hours. Lipid Profile: No results for input(s): CHOL, HDL, LDLCALC, TRIG, CHOLHDL, LDLDIRECT in the last 72 hours. Thyroid Function Tests: No results for input(s): TSH, T4TOTAL, FREET4, T3FREE, THYROIDAB in the last 72 hours. Anemia Panel: No results for input(s): VITAMINB12, FOLATE, FERRITIN, TIBC, IRON, RETICCTPCT in the last 72 hours. Sepsis Labs: Recent Labs  Lab 11/11/20 2036 11/12/20 0439 11/12/20 1329  PROCALCITON  --  <0.10 <0.10  LATICACIDVEN 1.3  --   --     Recent Results (from the past 240 hour(s))  Culture, blood (routine x 2)     Status: None (Preliminary result)   Collection Time: 11/11/20  8:36 PM   Specimen: BLOOD  Result Value Ref Range Status   Specimen Description BLOOD BLOOD RIGHT HAND  Final   Special Requests   Final    BOTTLES DRAWN AEROBIC AND ANAEROBIC Blood Culture results may not be optimal due to an inadequate volume of blood received in culture bottles   Culture   Final    NO GROWTH 2 DAYS Performed at [facility], [address] [place], [place] [address]    Report Status PENDING  Incomplete  Culture, blood (routine x 2)     Status: None (Preliminary result)   Collection Time: 11/11/20  8:36 PM   Specimen: BLOOD  Result Value Ref Range Status   Specimen Description BLOOD BLOOD RIGHT FOREARM  Final   Special Requests   Final    BOTTLES DRAWN AEROBIC AND ANAEROBIC Blood Culture results may not be optimal due to an inadequate volume of blood received in culture bottles   Culture   Final    NO GROWTH 2 DAYS Performed at [facility], [address] [place],  [address]    Report Status PENDING  Incomplete  Resp Panel by RT-PCR (Flu A&B, Covid) Nasopharyngeal Swab     Status: None   Collection Time: 11/11/20  8:36 PM   Specimen: Nasopharyngeal Swab; Nasopharyngeal(NP) swabs in vial transport medium  Result Value Ref Range Status   SARS Coronavirus 2 by RT PCR NEGATIVE NEGATIVE Final    Comment:  (NOTE) SARS-CoV-2 target nucleic acids are NOT DETECTED.  The SARS-CoV-2 RNA is generally detectable in upper respiratory specimens during the acute phase of infection. The lowest concentration of SARS-CoV-2 viral copies this assay can detect is 138 copies/mL. A negative result does not preclude SARS-Cov-2 infection and should not be used as the sole basis for treatment or other patient management decisions. A negative result may occur with  improper specimen collection/handling, submission of specimen other than nasopharyngeal swab, presence of viral mutation(s) within the areas targeted by this assay, and inadequate number of viral copies(<138 copies/mL). A negative result must be combined with  clinical observations, patient history, and epidemiological information. The expected result is Negative.  Fact Sheet for Patients:  EntrepreneurPulse.com.au  Fact Sheet for Healthcare Providers:  IncredibleEmployment.be  This test is no t yet approved or cleared by the [place] FDA and  has been authorized for detection and/or diagnosis of SARS-CoV-2 by FDA under an Emergency Use Authorization (EUA). This EUA will remain  in effect (meaning this test can be used) for the duration of the COVID-19 declaration under Section 564(b)(1) of the Act, 21 U.S.C.section 360bbb-3(b)(1), unless the authorization is terminated  or revoked sooner.       Influenza A by PCR NEGATIVE NEGATIVE Final   Influenza B by PCR NEGATIVE NEGATIVE Final    Comment: (NOTE) The Xpert Xpress SARS-CoV-2/FLU/RSV plus assay is intended as an aid in the diagnosis of influenza from Nasopharyngeal swab specimens and should not be used as a sole basis for treatment. Nasal washings and aspirates are unacceptable for Xpert Xpress SARS-CoV-2/FLU/RSV testing.  Fact Sheet for Patients: EntrepreneurPulse.com.au  Fact Sheet for Healthcare  Providers: IncredibleEmployment.be  This test is not yet approved or cleared by the [place] FDA and has been authorized for detection and/or diagnosis of SARS-CoV-2 by FDA under an Emergency Use Authorization (EUA). This EUA will remain in effect (meaning this test can be used) for the duration of the COVID-19 declaration under Section 564(b)(1) of the Act, 21 U.S.C. section 360bbb-3(b)(1), unless the authorization is terminated or revoked.  Performed at [facility], [address] [place], [place] [address]   Culture, sputum-assessment     Status: None   Collection Time: 11/12/20  4:40 AM   Specimen: Sputum  Result Value Ref Range Status   Specimen Description SPUTUM  Final   Special Requests NONE  Final   Sputum evaluation   Final    THIS SPECIMEN IS ACCEPTABLE FOR SPUTUM CULTURE Performed at [facility], [address], [place], [place] [address]    Report Status 11/12/2020 FINAL  Final  Culture, Respiratory w Gram Stain     Status: None (Preliminary result)   Collection Time: 11/12/20  4:40 AM   Specimen: SPU  Result Value Ref Range Status   Specimen Description   Final    SPUTUM Performed at [facility], [address] [place], [place] [address]    Special Requests   Final    NONE Reflexed from [id number] Performed at [facility], [address] [place], [place] [address]    Gram Stain NO WBC SEEN NO ORGANISMS SEEN   Final   Culture   Final    CULTURE REINCUBATED FOR BETTER GROWTH Performed at [facility], [place] [address], [place], [place] [address]    Report Status PENDING  Incomplete  Expectorated Sputum Assessment w Gram Stain, Rflx to Resp Cult     Status: None   Collection Time: 11/12/20  7:12 PM   Specimen: Sternum; Sputum  Result Value Ref Range Status   Specimen Description STERNUM  Final   Special Requests NONE  Final   Sputum evaluation   Final    THIS SPECIMEN IS ACCEPTABLE FOR  SPUTUM CULTURE Performed at [facility], [address], [place], [place] [address]    Report Status 11/12/2020 FINAL  Final      Radiology Studies: DG Chest 2 View  Result Date: 11/11/2020 CLINICAL DATA:  Shortness of breath EXAM: CHEST - 2 VIEW COMPARISON:  None. FINDINGS: There is an area that appears masslike  in the posterior segment of the left lower lobe measuring 6.8 x 6.5 x 6.4 cm. There is extensive airspace opacity throughout the left upper lobe as well. A small area of infiltrate is noted more inferiorly on the left. Right lung is clear. Heart size and pulmonary vascularity normal. Questionable adenopathy left hilum. No bone lesions. IMPRESSION: Suspected neoplastic focus in the superior segment left lower lobe measuring 6.8 x 6.5 x 6.4 cm. Apparent pneumonia throughout the left upper lobe. Ill-defined opacity left lower lobe may represent focal pneumonia, although on the frontal view, this area appears masslike and could represent a second potential neoplasm. This area on frontal view measures 3.7 x 3.1 cm. Advise chest CT for further assessment. Right lung clear. Heart size normal. Suspected left hilar adenopathy. Electronically Signed   By: Lowella Grip III M.D.   On: 11/11/2020 17:41   CT Angio Chest PE W and/or Wo Contrast  Result Date: 11/11/2020 CLINICAL DATA:  Lung mass. EXAM: CT ANGIOGRAPHY CHEST CT ABDOMEN AND PELVIS WITH CONTRAST TECHNIQUE: Multidetector CT imaging of the chest was performed using the standard protocol during bolus administration of intravenous contrast. Multiplanar CT image reconstructions and MIPs were obtained to evaluate the vascular anatomy. Multidetector CT imaging of the abdomen and pelvis was performed using the standard protocol during bolus administration of intravenous contrast. CONTRAST:  21mL OMNIPAQUE IOHEXOL 350 MG/ML SOLN COMPARISON:  Chest x-ray from same day. FINDINGS: CTA CHEST FINDINGS Cardiovascular: Satisfactory opacification  of the pulmonary arteries to the segmental level. No evidence of pulmonary embolism. Narrowing of the left main, lobar, and segmental pulmonary arteries due to tumor. Normal heart size. No pericardial effusion. Mediastinum/Nodes: Bulky mediastinal left hilar lymphadenopathy. Index prevascular lymph node measures 2.2 cm in short axis. Conglomerate subcarinal and left hilar lymphadenopathy and/or tumor, difficult to measure. Lower paraesophageal lymph node measuring 9 mm in short axis (series 4, image 81). Prominent left cardiophrenic angle lymph node measuring 7 mm in short axis (series 4, image 96). No pathologically enlarged right hilar or axillary lymph nodes. The thyroid gland, trachea, and esophagus demonstrate no significant findings. Lungs/Pleura: Large left perihilar and medial upper lobe mass, difficult to measure, with near complete occlusion of the central left upper and lower lobe bronchi. 7.5 x 4.1 cm mass predominantly within the superior segment of the left lower lobe, but crossing the major fissure. 1.2 x 2.6 cm subpleural nodule in the anterior left lower lobe (series 4, image 73). Patchy consolidation in the left upper lobe due to post obstructive pneumonia. Nodular interlobular septal thickening in the left upper lobe. Nodularity along the left major fissure superiorly. Small to moderate partially loculated left pleural effusion. The right lung is clear. No pneumothorax. Musculoskeletal: No chest wall abnormality. No acute or significant osseous findings. Review of the MIP images confirms the above findings. CT ABDOMEN AND PELVIS FINDINGS Hepatobiliary: Few scattered subcentimeter low-density lesions within the liver, too small to characterize. The gallbladder is unremarkable. No biliary dilatation. Pancreas: Unremarkable. No pancreatic ductal dilatation or surrounding inflammatory changes. Spleen: Normal in size without focal abnormality. Adrenals/Urinary Tract: Adrenal glands are unremarkable.  1.3 cm left renal simple cyst. No renal calculi or hydronephrosis. The bladder is under distended. Stomach/Bowel: Stomach is within normal limits. Appendix appears normal. No evidence of bowel wall thickening, distention, or inflammatory changes. Vascular/Lymphatic: No significant vascular findings are present. No enlarged abdominal or pelvic lymph nodes. Reproductive: Prostate is unremarkable. Other: Small fat containing left inguinal hernia. No free fluid or pneumoperitoneum. Musculoskeletal: No  acute or significant osseous findings. Review of the MIP images confirms the above findings. IMPRESSION: CT chest: 1. Large left perihilar and medial upper lobe mass with mediastinal invasion, consistent with primary lung cancer. Metastases in the left lower lobe measuring up to 7.5 cm. 2. Left upper lobe lymphangitic carcinomatosis. Bulky mediastinal and left hilar nodal metastases. 3. Near complete occlusion of the central left upper and lower lobe bronchi. Left upper lobe postobstructive pneumonia. 4. No evidence of pulmonary embolism. Narrowing of the left pulmonary arteries due to tumor. 5. 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 CT abdomen pelvis: 1. No evidence of metastatic disease in the abdomen or pelvis. Electronically Signed   By: Titus Dubin M.D.   On: 11/11/2020 21:37   CT ABDOMEN PELVIS W CONTRAST  Result Date: 11/11/2020 CLINICAL DATA:  Lung mass. EXAM: CT ANGIOGRAPHY CHEST CT ABDOMEN AND PELVIS WITH CONTRAST TECHNIQUE: Multidetector CT imaging of the chest was performed using the standard protocol during bolus administration of intravenous contrast. Multiplanar CT image reconstructions and MIPs were obtained to evaluate the vascular anatomy. Multidetector CT imaging of the abdomen and pelvis was performed using the standard protocol during bolus administration of intravenous contrast. CONTRAST:  42mL OMNIPAQUE IOHEXOL 350 MG/ML SOLN COMPARISON:  Chest x-ray from same  day. FINDINGS: CTA CHEST FINDINGS Cardiovascular: Satisfactory opacification of the pulmonary arteries to the segmental level. No evidence of pulmonary embolism. Narrowing of the left main, lobar, and segmental pulmonary arteries due to tumor. Normal heart size. No pericardial effusion. Mediastinum/Nodes: Bulky mediastinal left hilar lymphadenopathy. Index prevascular lymph node measures 2.2 cm in short axis. Conglomerate subcarinal and left hilar lymphadenopathy and/or tumor, difficult to measure. Lower paraesophageal lymph node measuring 9 mm in short axis (series 4, image 81). Prominent left cardiophrenic angle lymph node measuring 7 mm in short axis (series 4, image 96). No pathologically enlarged right hilar or axillary lymph nodes. The thyroid gland, trachea, and esophagus demonstrate no significant findings. Lungs/Pleura: Large left perihilar and medial upper lobe mass, difficult to measure, with near complete occlusion of the central left upper and lower lobe bronchi. 7.5 x 4.1 cm mass predominantly within the superior segment of the left lower lobe, but crossing the major fissure. 1.2 x 2.6 cm subpleural nodule in the anterior left lower lobe (series 4, image 73). Patchy consolidation in the left upper lobe due to post obstructive pneumonia. Nodular interlobular septal thickening in the left upper lobe. Nodularity along the left major fissure superiorly. Small to moderate partially loculated left pleural effusion. The right lung is clear. No pneumothorax. Musculoskeletal: No chest wall abnormality. No acute or significant osseous findings. Review of the MIP images confirms the above findings. CT ABDOMEN AND PELVIS FINDINGS Hepatobiliary: Few scattered subcentimeter low-density lesions within the liver, too small to characterize. The gallbladder is unremarkable. No biliary dilatation. Pancreas: Unremarkable. No pancreatic ductal dilatation or surrounding inflammatory changes. Spleen: Normal in size without  focal abnormality. Adrenals/Urinary Tract: Adrenal glands are unremarkable. 1.3 cm left renal simple cyst. No renal calculi or hydronephrosis. The bladder is under distended. Stomach/Bowel: Stomach is within normal limits. Appendix appears normal. No evidence of bowel wall thickening, distention, or inflammatory changes. Vascular/Lymphatic: No significant vascular findings are present. No enlarged abdominal or pelvic lymph nodes. Reproductive: Prostate is unremarkable. Other: Small fat containing left inguinal hernia. No free fluid or pneumoperitoneum. Musculoskeletal: No acute or significant osseous findings. Review of the MIP images confirms the above findings. IMPRESSION: CT chest: 1. Large left perihilar and  medial upper lobe mass with mediastinal invasion, consistent with primary lung cancer. Metastases in the left lower lobe measuring up to 7.5 cm. 2. Left upper lobe lymphangitic carcinomatosis. Bulky mediastinal and left hilar nodal metastases. 3. Near complete occlusion of the central left upper and lower lobe bronchi. Left upper lobe postobstructive pneumonia. 4. No evidence of pulmonary embolism. Narrowing of the left pulmonary arteries due to tumor. 5. 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 CT abdomen pelvis: 1. No evidence of metastatic disease in the abdomen or pelvis. Electronically Signed   By: Titus Dubin M.D.   On: 11/11/2020 21:37     Scheduled Meds: . dextromethorphan-guaiFENesin  1 tablet Oral BID  . enoxaparin (LOVENOX) injection  40 mg Subcutaneous Q24H  . ipratropium-albuterol  3 mL Nebulization TID  . pantoprazole  40 mg Oral Daily  . predniSONE  50 mg Oral Q breakfast   Continuous Infusions: . azithromycin 500 mg (11/12/20 2252)  . cefTRIAXone (ROCEPHIN)  IV 2 g (11/13/20 0442)     LOS: 2 days     Enzo Bi, MD [facility] If 7PM-7AM, please contact night-coverage 11/13/2020, 2:44 PM

## 2020-11-13 NOTE — Plan of Care (Signed)

## 2020-11-14 DIAGNOSIS — E43 Unspecified severe protein-calorie malnutrition: Secondary | ICD-10-CM | POA: Insufficient documentation

## 2020-11-14 LAB — CBC
HCT: 41.8 % (ref 39.0–52.0)
Hemoglobin: 14.2 g/dL (ref 13.0–17.0)
MCH: 32.2 pg (ref 26.0–34.0)
MCHC: 34 g/dL (ref 30.0–36.0)
MCV: 94.8 fL (ref 80.0–100.0)
Platelets: 380 10*3/uL (ref 150–400)
RBC: 4.41 MIL/uL (ref 4.22–5.81)
RDW: 12.9 % (ref 11.5–15.5)
WBC: 9.8 10*3/uL (ref 4.0–10.5)
nRBC: 0 % (ref 0.0–0.2)

## 2020-11-14 LAB — BASIC METABOLIC PANEL
Anion gap: 8 (ref 5–15)
BUN: 8 mg/dL (ref 6–20)
CO2: 27 mmol/L (ref 22–32)
Calcium: 8.8 mg/dL — ABNORMAL LOW (ref 8.9–10.3)
Chloride: 104 mmol/L (ref 98–111)
Creatinine, Ser: 0.54 mg/dL — ABNORMAL LOW (ref 0.61–1.24)
GFR, Estimated: >60 mL/min (ref >60)
Glucose, Bld: 90 mg/dL (ref 70–99)
Potassium: 4.1 mmol/L (ref 3.5–5.1)
Sodium: 139 mmol/L (ref 135–145)

## 2020-11-14 LAB — CULTURE, RESPIRATORY W GRAM STAIN
Culture: NORMAL
Gram Stain: NONE SEEN

## 2020-11-14 LAB — MAGNESIUM: Magnesium: 1.8 mg/dL (ref 1.7–2.4)

## 2020-11-14 MED ORDER — ENSURE ENLIVE PO LIQD: 237.0000 mL | Freq: Three times a day (TID) | ORAL | 12 refills | Status: DC

## 2020-11-14 MED ORDER — VITAMIN D3 25 MCG PO TABS: 1000.0000 [IU] | ORAL_TABLET | Freq: Every day | ORAL | Status: DC

## 2020-11-14 MED ORDER — PREDNISONE 20 MG PO TABS: ORAL_TABLET | ORAL | 0 refills | Status: DC

## 2020-11-14 MED ORDER — ALBUTEROL SULFATE HFA 108 (90 BASE) MCG/ACT IN AERS: 1.0000 | INHALATION_SPRAY | RESPIRATORY_TRACT | 2 refills | Status: DC | PRN

## 2020-11-14 MED ORDER — AMOXICILLIN-POT CLAVULANATE 875-125 MG PO TABS: 1.0000 | ORAL_TABLET | Freq: Two times a day (BID) | ORAL | 0 refills | Status: AC

## 2020-11-14 MED ORDER — IPRATROPIUM-ALBUTEROL 0.5-2.5 (3) MG/3ML IN SOLN: 3.0000 mL | Freq: Four times a day (QID) | RESPIRATORY_TRACT | 2 refills | Status: DC | PRN

## 2020-11-14 MED ORDER — DM-GUAIFENESIN ER 30-600 MG PO TB12: 1.0000 | ORAL_TABLET | Freq: Two times a day (BID) | ORAL | 0 refills | Status: AC

## 2020-11-14 MED ORDER — ADULT MULTIVITAMIN W/MINERALS CH: 1.0000 | ORAL_TABLET | Freq: Every day | ORAL | Status: DC

## 2020-11-14 MED ORDER — ENSURE ENLIVE PO LIQD: 237.0000 mL | Freq: Three times a day (TID) | ORAL | Status: DC

## 2020-11-14 MED ORDER — VITAMIN D 25 MCG (1000 UNIT) PO TABS: 1000.0000 [IU] | ORAL_TABLET | Freq: Every day | ORAL | Status: DC

## 2020-11-14 MED ORDER — ADULT MULTIVITAMIN W/MINERALS CH
1.0000 | ORAL_TABLET | Freq: Every day | ORAL | Status: DC
  Administered 2020-11-14: 1 via ORAL

## 2020-11-14 NOTE — TOC Transition Note (Signed)
Transition of Care [facility]) - CM/SW Discharge Note   Patient Details  Name: Curtis Manning. MRN: [id number] Date of Birth: 18-Mar-1971  Transition of Care [facility]) CM/SW Contact:  Pete Pelt, RN Phone Number: 11/14/2020, 2:44 PM   Clinical Narrative:   Patient with discharge orders.  Lives at home with his wife, no concerns about getting to appointments or getting medications.  Currently has nebulizer at home, no home health care.  Declines TOC needs at this time.  Mother will transport patient home.      Barriers to Discharge: Barriers Resolved   Patient Goals and CMS Choice     Choice offered to / list presented to : NA  Discharge Placement                       Discharge Plan and Services   Discharge Planning Services: CM Consult                                 Social Determinants of Health (SDOH) Interventions     Readmission Risk Interventions No flowsheet data found.

## 2020-11-14 NOTE — Discharge Summary (Signed)
Physician Discharge Summary   Curtis Manning.  male DOB: February 04, 1971  TGY:[id number]  PCP: Pcp, No  Admit date: 11/11/2020 Discharge date: 11/14/2020  Admitted From: home Disposition:  home CODE STATUS: Full code  Discharge Instructions    Discharge instructions   Complete by: As directed    You have received 3 days of IV antibiotic and is discharged on 4 more days of oral Augmentin to take at home as directed.  Please finish prednisone taper as directed.  Please follow up with pulm Dr. Raul Del as outpatient.   Dr. Enzo Bi [facility] Course:  For full details, please see H&P, progress notes, consult notes and ancillary notes.  Briefly,  Curtis Manningis a 50 y.o.Caucasian malewith medical history significant forasthma, ongoing tobacco abuseand GERD who presented to the emergency room with acute onset of significant worsening dyspnea with associated cough productive of clear sputum as well as wheezing.   The patient has been actually having symptoms for the last few weeks. He was seen on an outpatient basis and was given 2 courses of antibiotics. He had a chest x-ray that showed suspected lung mass and was supposed to be referred to pulmonology.He admitted to weight loss of about 35 pounds over the last 2 to 3 months. He has been having night sweats and fever.    1. Left-sided postobstructive pneumonia with highly suspicious left lung mass. --chronic smoker.  Has constitutional symptoms for ~3 months.   -started on IV Rocephin and Zithromax --Pulm consulted, Dr. Lanney Gins --pt received 3 days of IV antibiotic and was discharged on 4 more days of Augmentin. --respiratory symptoms much improved prior to discharge.   --Pt will follow up with Dr. Raul Del for further workup of his lung mass.  2. Probable COPD with acute exacerbation -started on IV solumedrol and DuoNeb, ceftriaxone and azithromycin.  Was not hypoxic.  Respiratory symptoms  improved quickly with treatment. --Pulm consulted and recommended discharge with prednisone taper.  3. Tobacco abuse. --cessation advised  4. GERD. --cont home PPI   Discharge Diagnoses:  Active Problems:   CAP (community acquired pneumonia)   Protein-calorie malnutrition, severe   30 Day Unplanned Readmission Risk Score   Flowsheet Row ED to Hosp-Admission (Current) from 11/11/2020 in [facility] (1A)  30 Day Unplanned Readmission Risk Score (%) 12.85 Filed at 11/14/2020 1200     This score is the patient's risk of an unplanned readmission within 30 days of being discharged (0 -100%). The score is based on dignosis, age, lab data, medications, orders, and past utilization.   Low:  0-14.9   Medium: 15-21.9   High: 22-29.9   Extreme: 30 and above        Discharge Instructions:  Allergies as of 11/14/2020   No Known Allergies     Medication List    TAKE these medications   albuterol 108 (90 Base) MCG/ACT inhaler Commonly known as: VENTOLIN HFA Inhale 1-2 puffs into the lungs every 4 (four) hours as needed for wheezing or shortness of breath. What changed: Another medication with the same name was removed. Continue taking this medication, and follow the directions you see here.   amoxicillin-clavulanate 875-125 MG tablet Commonly known as: Augmentin Take 1 tablet by mouth 2 (two) times daily for 4 days. Antibiotic for pneumonia.   chlorpheniramine-HYDROcodone 10-8 MG/5ML Suer Commonly known as: TUSSIONEX Take 5 mLs by mouth every 12 (twelve) hours as needed for cough.  dextromethorphan-guaiFENesin 30-600 MG 12hr tablet Commonly known as: MUCINEX DM Take 1 tablet by mouth 2 (two) times daily for 7 days.   feeding supplement Liqd Take 237 mLs by mouth 3 (three) times daily between meals.   ipratropium-albuterol 0.5-2.5 (3) MG/3ML Soln Commonly known as: DUONEB Take 3 mLs by nebulization every 6 (six) hours as needed.    multivitamin with minerals Tabs tablet Take 1 tablet by mouth daily. Start taking on: Nov 15, 2020   omeprazole 10 MG capsule Commonly known as: PRILOSEC Take 20 mg by mouth daily.   predniSONE 20 MG tablet Commonly known as: DELTASONE Take 40 mg (2 tablets) from 5/17 to 5/18, then 20 mg (1 tablet) from 5/19 to 5/22, then done. Start taking on: Nov 15, 2020   Vitamin D3 25 MCG tablet Commonly known as: Vitamin D Take 1 tablet (1,000 Units total) by mouth daily. Can take any over-the-counter.        Follow-up Information    Erby Pian, MD Follow up.   Specialty: Specialist Contact information: [place] [address] [phone]               No Known Allergies   The results of significant diagnostics from this hospitalization (including imaging, microbiology, ancillary and laboratory) are listed below for reference.   Consultations:   Procedures/Studies: DG Chest 2 View  Result Date: 11/11/2020 CLINICAL DATA:  Shortness of breath EXAM: CHEST - 2 VIEW COMPARISON:  None. FINDINGS: There is an area that appears masslike in the posterior segment of the left lower lobe measuring 6.8 x 6.5 x 6.4 cm. There is extensive airspace opacity throughout the left upper lobe as well. A small area of infiltrate is noted more inferiorly on the left. Right lung is clear. Heart size and pulmonary vascularity normal. Questionable adenopathy left hilum. No bone lesions. IMPRESSION: Suspected neoplastic focus in the superior segment left lower lobe measuring 6.8 x 6.5 x 6.4 cm. Apparent pneumonia throughout the left upper lobe. Ill-defined opacity left lower lobe may represent focal pneumonia, although on the frontal view, this area appears masslike and could represent a second potential neoplasm. This area on frontal view measures 3.7 x 3.1 cm. Advise chest CT for further assessment. Right lung clear. Heart size normal. Suspected left hilar adenopathy.  Electronically Signed   By: Lowella Grip III M.D.   On: 11/11/2020 17:41   CT Angio Chest PE W and/or Wo Contrast  Result Date: 11/11/2020 CLINICAL DATA:  Lung mass. EXAM: CT ANGIOGRAPHY CHEST CT ABDOMEN AND PELVIS WITH CONTRAST TECHNIQUE: Multidetector CT imaging of the chest was performed using the standard protocol during bolus administration of intravenous contrast. Multiplanar CT image reconstructions and MIPs were obtained to evaluate the vascular anatomy. Multidetector CT imaging of the abdomen and pelvis was performed using the standard protocol during bolus administration of intravenous contrast. CONTRAST:  18mL OMNIPAQUE IOHEXOL 350 MG/ML SOLN COMPARISON:  Chest x-ray from same day. FINDINGS: CTA CHEST FINDINGS Cardiovascular: Satisfactory opacification of the pulmonary arteries to the segmental level. No evidence of pulmonary embolism. Narrowing of the left main, lobar, and segmental pulmonary arteries due to tumor. Normal heart size. No pericardial effusion. Mediastinum/Nodes: Bulky mediastinal left hilar lymphadenopathy. Index prevascular lymph node measures 2.2 cm in short axis. Conglomerate subcarinal and left hilar lymphadenopathy and/or tumor, difficult to measure. Lower paraesophageal lymph node measuring 9 mm in short axis (series 4, image 81). Prominent left cardiophrenic angle lymph node measuring 7 mm in short axis (  series 4, image 96). No pathologically enlarged right hilar or axillary lymph nodes. The thyroid gland, trachea, and esophagus demonstrate no significant findings. Lungs/Pleura: Large left perihilar and medial upper lobe mass, difficult to measure, with near complete occlusion of the central left upper and lower lobe bronchi. 7.5 x 4.1 cm mass predominantly within the superior segment of the left lower lobe, but crossing the major fissure. 1.2 x 2.6 cm subpleural nodule in the anterior left lower lobe (series 4, image 73). Patchy consolidation in the left upper lobe due to  post obstructive pneumonia. Nodular interlobular septal thickening in the left upper lobe. Nodularity along the left major fissure superiorly. Small to moderate partially loculated left pleural effusion. The right lung is clear. No pneumothorax. Musculoskeletal: No chest wall abnormality. No acute or significant osseous findings. Review of the MIP images confirms the above findings. CT ABDOMEN AND PELVIS FINDINGS Hepatobiliary: Few scattered subcentimeter low-density lesions within the liver, too small to characterize. The gallbladder is unremarkable. No biliary dilatation. Pancreas: Unremarkable. No pancreatic ductal dilatation or surrounding inflammatory changes. Spleen: Normal in size without focal abnormality. Adrenals/Urinary Tract: Adrenal glands are unremarkable. 1.3 cm left renal simple cyst. No renal calculi or hydronephrosis. The bladder is under distended. Stomach/Bowel: Stomach is within normal limits. Appendix appears normal. No evidence of bowel wall thickening, distention, or inflammatory changes. Vascular/Lymphatic: No significant vascular findings are present. No enlarged abdominal or pelvic lymph nodes. Reproductive: Prostate is unremarkable. Other: Small fat containing left inguinal hernia. No free fluid or pneumoperitoneum. Musculoskeletal: No acute or significant osseous findings. Review of the MIP images confirms the above findings. IMPRESSION: CT chest: 1. Large left perihilar and medial upper lobe mass with mediastinal invasion, consistent with primary lung cancer. Metastases in the left lower lobe measuring up to 7.5 cm. 2. Left upper lobe lymphangitic carcinomatosis. Bulky mediastinal and left hilar nodal metastases. 3. Near complete occlusion of the central left upper and lower lobe bronchi. Left upper lobe postobstructive pneumonia. 4. No evidence of pulmonary embolism. Narrowing of the left pulmonary arteries due to tumor. 5. 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 CT abdomen pelvis: 1. No evidence of metastatic disease in the abdomen or pelvis. Electronically Signed   By: Titus Dubin M.D.   On: 11/11/2020 21:37   CT ABDOMEN PELVIS W CONTRAST  Result Date: 11/11/2020 CLINICAL DATA:  Lung mass. EXAM: CT ANGIOGRAPHY CHEST CT ABDOMEN AND PELVIS WITH CONTRAST TECHNIQUE: Multidetector CT imaging of the chest was performed using the standard protocol during bolus administration of intravenous contrast. Multiplanar CT image reconstructions and MIPs were obtained to evaluate the vascular anatomy. Multidetector CT imaging of the abdomen and pelvis was performed using the standard protocol during bolus administration of intravenous contrast. CONTRAST:  15mL OMNIPAQUE IOHEXOL 350 MG/ML SOLN COMPARISON:  Chest x-ray from same day. FINDINGS: CTA CHEST FINDINGS Cardiovascular: Satisfactory opacification of the pulmonary arteries to the segmental level. No evidence of pulmonary embolism. Narrowing of the left main, lobar, and segmental pulmonary arteries due to tumor. Normal heart size. No pericardial effusion. Mediastinum/Nodes: Bulky mediastinal left hilar lymphadenopathy. Index prevascular lymph node measures 2.2 cm in short axis. Conglomerate subcarinal and left hilar lymphadenopathy and/or tumor, difficult to measure. Lower paraesophageal lymph node measuring 9 mm in short axis (series 4, image 81). Prominent left cardiophrenic angle lymph node measuring 7 mm in short axis (series 4, image 96). No pathologically enlarged right hilar or axillary lymph nodes. The thyroid gland, trachea, and esophagus demonstrate no significant  findings. Lungs/Pleura: Large left perihilar and medial upper lobe mass, difficult to measure, with near complete occlusion of the central left upper and lower lobe bronchi. 7.5 x 4.1 cm mass predominantly within the superior segment of the left lower lobe, but crossing the major fissure. 1.2 x 2.6 cm subpleural nodule in the anterior left lower  lobe (series 4, image 73). Patchy consolidation in the left upper lobe due to post obstructive pneumonia. Nodular interlobular septal thickening in the left upper lobe. Nodularity along the left major fissure superiorly. Small to moderate partially loculated left pleural effusion. The right lung is clear. No pneumothorax. Musculoskeletal: No chest wall abnormality. No acute or significant osseous findings. Review of the MIP images confirms the above findings. CT ABDOMEN AND PELVIS FINDINGS Hepatobiliary: Few scattered subcentimeter low-density lesions within the liver, too small to characterize. The gallbladder is unremarkable. No biliary dilatation. Pancreas: Unremarkable. No pancreatic ductal dilatation or surrounding inflammatory changes. Spleen: Normal in size without focal abnormality. Adrenals/Urinary Tract: Adrenal glands are unremarkable. 1.3 cm left renal simple cyst. No renal calculi or hydronephrosis. The bladder is under distended. Stomach/Bowel: Stomach is within normal limits. Appendix appears normal. No evidence of bowel wall thickening, distention, or inflammatory changes. Vascular/Lymphatic: No significant vascular findings are present. No enlarged abdominal or pelvic lymph nodes. Reproductive: Prostate is unremarkable. Other: Small fat containing left inguinal hernia. No free fluid or pneumoperitoneum. Musculoskeletal: No acute or significant osseous findings. Review of the MIP images confirms the above findings. IMPRESSION: CT chest: 1. Large left perihilar and medial upper lobe mass with mediastinal invasion, consistent with primary lung cancer. Metastases in the left lower lobe measuring up to 7.5 cm. 2. Left upper lobe lymphangitic carcinomatosis. Bulky mediastinal and left hilar nodal metastases. 3. Near complete occlusion of the central left upper and lower lobe bronchi. Left upper lobe postobstructive pneumonia. 4. No evidence of pulmonary embolism. Narrowing of the left pulmonary arteries  due to tumor. 5. 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 CT abdomen pelvis: 1. No evidence of metastatic disease in the abdomen or pelvis. Electronically Signed   By: Titus Dubin M.D.   On: 11/11/2020 21:37      Labs: BNP (last 3 results) No results for input(s): BNP in the last 8760 hours. Basic Metabolic Panel: Recent Labs  Lab 11/11/20 1702 11/12/20 0439 11/13/20 0428 11/14/20 0534  NA 133* 134* 141 139  K 4.3 4.6 3.9 4.1  CL 99 101 105 104  CO2 23 24 25 27   GLUCOSE 124* 229* 101* 90  BUN 9 9 11 8   CREATININE 0.44* 0.51* 0.61 0.54*  CALCIUM 9.0 8.2* 8.7* 8.8*  MG  --   --  1.9 1.8   Liver Function Tests: Recent Labs  Lab 11/11/20 1702  AST 25  ALT 13  ALKPHOS 76  BILITOT 0.5  PROT 6.9  ALBUMIN 2.9*   No results for input(s): LIPASE, AMYLASE in the last 168 hours. No results for input(s): AMMONIA in the last 168 hours. CBC: Recent Labs  Lab 11/11/20 1702 11/12/20 0439 11/13/20 0428 11/14/20 0534  WBC 11.1* 7.1 11.8* 9.8  HGB 14.9 12.9* 13.5 14.2  HCT 43.2 38.0* 40.3 41.8  MCV 94.9 96.4 95.3 94.8  PLT 410* 360 407* 380   Cardiac Enzymes: No results for input(s): CKTOTAL, CKMB, CKMBINDEX, TROPONINI in the last 168 hours. BNP: Invalid input(s): POCBNP CBG: No results for input(s): GLUCAP in the last 168 hours. D-Dimer No results for input(s): DDIMER in the last  72 hours. Hgb A1c No results for input(s): HGBA1C in the last 72 hours. Lipid Profile No results for input(s): CHOL, HDL, LDLCALC, TRIG, CHOLHDL, LDLDIRECT in the last 72 hours. Thyroid function studies No results for input(s): TSH, T4TOTAL, T3FREE, THYROIDAB in the last 72 hours.  Invalid input(s): FREET3 Anemia work up No results for input(s): VITAMINB12, FOLATE, FERRITIN, TIBC, IRON, RETICCTPCT in the last 72 hours. Urinalysis No results found for: COLORURINE, APPEARANCEUR, [place], [place], [place], [place], [place], [place], [place],  UROBILINOGEN, NITRITE, LEUKOCYTESUR Sepsis Labs Invalid input(s): PROCALCITONIN,  WBC,  LACTICIDVEN Microbiology Recent Results (from the past 240 hour(s))  Culture, blood (routine x 2)     Status: None (Preliminary result)   Collection Time: 11/11/20  8:36 PM   Specimen: BLOOD  Result Value Ref Range Status   Specimen Description BLOOD BLOOD RIGHT HAND  Final   Special Requests   Final    BOTTLES DRAWN AEROBIC AND ANAEROBIC Blood Culture results may not be optimal due to an inadequate volume of blood received in culture bottles   Culture   Final    NO GROWTH 3 DAYS Performed at [facility], [address], [place], [place] [address]    Report Status PENDING  Incomplete  Culture, blood (routine x 2)     Status: None (Preliminary result)   Collection Time: 11/11/20  8:36 PM   Specimen: BLOOD  Result Value Ref Range Status   Specimen Description BLOOD BLOOD RIGHT FOREARM  Final   Special Requests   Final    BOTTLES DRAWN AEROBIC AND ANAEROBIC Blood Culture results may not be optimal due to an inadequate volume of blood received in culture bottles   Culture   Final    NO GROWTH 3 DAYS Performed at [facility], [address], [place], [place] [address]    Report Status PENDING  Incomplete  Resp Panel by RT-PCR (Flu A&B, Covid) Nasopharyngeal Swab     Status: None   Collection Time: 11/11/20  8:36 PM   Specimen: Nasopharyngeal Swab; Nasopharyngeal(NP) swabs in vial transport medium  Result Value Ref Range Status   SARS Coronavirus 2 by RT PCR NEGATIVE NEGATIVE Final    Comment: (NOTE) SARS-CoV-2 target nucleic acids are NOT DETECTED.  The SARS-CoV-2 RNA is generally detectable in upper respiratory specimens during the acute phase of infection. The lowest concentration of SARS-CoV-2 viral copies this assay can detect is 138 copies/mL. A negative result does not preclude SARS-Cov-2 infection and should not be used as the sole basis for treatment or other  patient management decisions. A negative result may occur with  improper specimen collection/handling, submission of specimen other than nasopharyngeal swab, presence of viral mutation(s) within the areas targeted by this assay, and inadequate number of viral copies(<138 copies/mL). A negative result must be combined with clinical observations, patient history, and epidemiological information. The expected result is Negative.  Fact Sheet for Patients:  EntrepreneurPulse.com.au  Fact Sheet for Healthcare Providers:  IncredibleEmployment.be  This test is no t yet approved or cleared by the [place] FDA and  has been authorized for detection and/or diagnosis of SARS-CoV-2 by FDA under an Emergency Use Authorization (EUA). This EUA will remain  in effect (meaning this test can be used) for the duration of the COVID-19 declaration under Section 564(b)(1) of the Act, 21 U.S.C.section 360bbb-3(b)(1), unless the authorization is terminated  or revoked sooner.       Influenza A by PCR NEGATIVE NEGATIVE Final   Influenza B by PCR  NEGATIVE NEGATIVE Final    Comment: (NOTE) The Xpert Xpress SARS-CoV-2/FLU/RSV plus assay is intended as an aid in the diagnosis of influenza from Nasopharyngeal swab specimens and should not be used as a sole basis for treatment. Nasal washings and aspirates are unacceptable for Xpert Xpress SARS-CoV-2/FLU/RSV testing.  Fact Sheet for Patients: EntrepreneurPulse.com.au  Fact Sheet for Healthcare Providers: IncredibleEmployment.be  This test is not yet approved or cleared by the [place] FDA and has been authorized for detection and/or diagnosis of SARS-CoV-2 by FDA under an Emergency Use Authorization (EUA). This EUA will remain in effect (meaning this test can be used) for the duration of the COVID-19 declaration under Section 564(b)(1) of the Act, 21 U.S.C. section  360bbb-3(b)(1), unless the authorization is terminated or revoked.  Performed at [facility], [address] [place], [place] [address]   Culture, sputum-assessment     Status: None   Collection Time: 11/12/20  4:40 AM   Specimen: Sputum  Result Value Ref Range Status   Specimen Description SPUTUM  Final   Special Requests NONE  Final   Sputum evaluation   Final    THIS SPECIMEN IS ACCEPTABLE FOR SPUTUM CULTURE Performed at [facility], [address], [place], [place] [address]    Report Status 11/12/2020 FINAL  Final  Culture, Respiratory w Gram Stain     Status: None   Collection Time: 11/12/20  4:40 AM   Specimen: SPU  Result Value Ref Range Status   Specimen Description   Final    SPUTUM Performed at [facility], [address] [place], [place] [address]    Special Requests   Final    NONE Reflexed from [id number] Performed at [facility], [address] [place], [place] [address]    Gram Stain NO WBC SEEN NO ORGANISMS SEEN   Final   Culture   Final    RARE Normal respiratory flora-no Staph aureus or Pseudomonas seen Performed at [facility], [place] [address], [place], [place] [address]    Report Status 11/14/2020 FINAL  Final  Expectorated Sputum Assessment w Gram Stain, Rflx to Resp Cult     Status: None   Collection Time: 11/12/20  7:12 PM   Specimen: Sternum; Sputum  Result Value Ref Range Status   Specimen Description STERNUM  Final   Special Requests NONE  Final   Sputum evaluation   Final    THIS SPECIMEN IS ACCEPTABLE FOR SPUTUM CULTURE Performed at [facility], [address] [place], [place] [address]    Report Status 11/12/2020 FINAL  Final  Culture, Respiratory w Gram Stain     Status: None (Preliminary result)   Collection Time: 11/12/20  7:12 PM   Specimen: Sternum  Result Value Ref Range Status   Specimen Description   Final    STERNUM Performed at [facility], [address], [place], [place] [address]    Special Requests   Final    NONE Reflexed from [id number] Performed at [facility], [address] [place], [place] [address]    Gram Stain NO WBC SEEN NO ORGANISMS SEEN   Final   Culture   Final    CULTURE REINCUBATED FOR BETTER GROWTH Performed at [facility], [place] [address] [place],  [address]    Report Status PENDING  Incomplete     Total time spend on discharging this patient, including the last patient exam, discussing the hospital stay, instructions  for ongoing care as it relates to all pertinent caregivers, as well as preparing the medical discharge records, prescriptions, and/or referrals as applicable, is 35 minutes.    Enzo Bi, MD  [facility] 11/14/2020, 1:53 PM

## 2020-11-14 NOTE — Progress Notes (Signed)
Pulmonary Medicine          Date: 11/14/2020,   MRN# [id number] Curtis Manning. August 06, 1970     AdmissionWeight: 63.5 kg                 CurrentWeight: 63.5 kg   Referring physician: Dr Billie Ruddy   CHIEF COMPLAINT:   Lung mass   HISTORY OF PRESENT ILLNESS   Curtis Axel. is a 50 y.o. Caucasian male with medical history significant for asthma, ongoing tobacco abuse and GERD who presented to the emergency room with acute onset of significant worsening dyspnea with associated cough productive of clear sputum as well as wheezing.  The patient has been actually having symptoms for the last few weeks.  He was seen on an outpatient basis and was given 2 courses of antibiotics.  He had a chest x-ray that showed suspected lung mass and was supposed to be referred to pulmonology.  He admitted to weight loss of about 35 pounds over the last 2 to 3 months.  He has been having night sweats and fever.  No chest pain or palpitations.  No nausea or vomiting or abdominal pain.  No dysuria, regular hematuria or flank pain.  No bleeding diathesis.  Upon presentation to the ER rate was 136 with otherwise normal vital signs and later heart rate is 123.  Respiratory rate was initially normal and later 29.  Labs revealed hyponatremia 133 and albumin 2.9.  CBC showed minimal leukocytosis of 11.1 and thrombocytosis of 410.  Lactic acid was 1.3.  Influenza antigens and COVID-19 PCR came back negative.    He has been sick >1 month.  He is a smoker currently smokes trying to quit.   I reviewed CT chest independently.   I met with wife Angie and discussed findings.  Patient had additional questions which we discussed.   Patient has severe asthma, he was intubated and placed on mechanical ventilation at age 51.  He has been on prednisone recurrently.  He just finished prednisone taper  We discussed lung mass and biopsy.   11/14/20- patient is stable he is on room air.  He is able to be discharged and  have additional workup done on outpatient.  He has pulmonary workup done at [facility]. We discussed further workup needed including biopsy of lesion on CT chest. He was intubated at [facility] before and had workup there while in ICU and further workup after moving out to medical floor.  He has been seen there with pulmonary and has nebulizer prescribed for asthma.    PAST MEDICAL HISTORY   Past Medical History:  Diagnosis Date  . Asthma      SURGICAL HISTORY   He never had surgery in the past  FAMILY HISTORY   No family history on file.   SOCIAL HISTORY   Social History   Tobacco Use  . Smoking status: Current Every Day Smoker  . Smokeless tobacco: Never Used     MEDICATIONS    Home Medication:    Current Medication:  Current Facility-Administered Medications:  .  acetaminophen (TYLENOL) tablet 650 mg, 650 mg, Oral, Q6H PRN **OR** acetaminophen (TYLENOL) suppository 650 mg, 650 mg, Rectal, Q6H PRN, Mansy, Jan A, MD .  azithromycin (ZITHROMAX) 500 mg in sodium chloride 0.9 % 250 mL IVPB, 500 mg, Intravenous, Q24H, Mansy, Arvella Merles, MD, Stopped at 11/14/20 0041 .  cefTRIAXone (ROCEPHIN) 2 g in sodium chloride 0.9 % 100 mL IVPB, 2 g, Intravenous, Q24H,  Mansy, Jan A, MD, Last Rate: 200 mL/hr at 11/14/20 0502, 2 g at 11/14/20 0502 .  dextromethorphan-guaiFENesin (MUCINEX DM) 30-600 MG per 12 hr tablet 1 tablet, 1 tablet, Oral, BID, Mansy, Jan A, MD, 1 tablet at 11/14/20 1000 .  dextromethorphan-guaiFENesin (MUCINEX DM) 30-600 MG per 12 hr tablet 1 tablet, 1 tablet, Oral, BID PRN, Mansy, Arvella Merles, MD, 1 tablet at 11/14/20 0505 .  enoxaparin (LOVENOX) injection 40 mg, 40 mg, Subcutaneous, Q24H, Mansy, Jan A, MD, 40 mg at 11/13/20 2242 .  feeding supplement (ENSURE ENLIVE / ENSURE PLUS) liquid 237 mL, 237 mL, Oral, TID BM, Enzo Bi, MD .  ipratropium-albuterol (DUONEB) 0.5-2.5 (3) MG/3ML nebulizer solution 3 mL, 3 mL, Nebulization, TID, Enzo Bi, MD, 3 mL at 11/14/20 0740 .  magnesium hydroxide  (MILK OF MAGNESIA) suspension 30 mL, 30 mL, Oral, Daily PRN, Mansy, Jan A, MD .  multivitamin with minerals tablet 1 tablet, 1 tablet, Oral, Daily, Enzo Bi, MD .  ondansetron [facility]) tablet 4 mg, 4 mg, Oral, Q6H PRN **OR** ondansetron (ZOFRAN) injection 4 mg, 4 mg, Intravenous, Q6H PRN, Mansy, Jan A, MD .  pantoprazole (PROTONIX) EC tablet 40 mg, 40 mg, Oral, Daily, Enzo Bi, MD, 40 mg at 11/14/20 0940 .  predniSONE (DELTASONE) tablet 50 mg, 50 mg, Oral, Q breakfast, Gael Delude, MD, 50 mg at 11/14/20 0940 .  traZODone (DESYREL) tablet 25 mg, 25 mg, Oral, QHS PRN, Mansy, Jan A, MD, 25 mg at 11/13/20 2242    ALLERGIES   Patient has no known allergies.     REVIEW OF SYSTEMS    Review of Systems:  Gen:  Denies  fever, sweats, chills weigh loss  HEENT: Denies blurred vision, double vision, ear pain, eye pain, hearing loss, nose bleeds, sore throat Cardiac:  No dizziness, chest pain or heaviness, chest tightness,edema Resp:   Denies cough or sputum porduction, shortness of breath,wheezing, hemoptysis,  Gi: Denies swallowing difficulty, stomach pain, nausea or vomiting, diarrhea, constipation, bowel incontinence Gu:  Denies bladder incontinence, burning urine Ext:   Denies Joint pain, stiffness or swelling Skin: Denies  skin rash, easy bruising or bleeding or hives Endoc:  Denies polyuria, polydipsia , polyphagia or weight change Psych:   Denies depression, insomnia or hallucinations   Other:  All other systems negative   VS: BP 117/78 (BP Location: Left Arm)   Pulse (!) 106   Temp 97.6 F (36.4 C) (Oral)   Resp 16   Ht 5' 8" (1.727 m)   Wt 63.5 kg   SpO2 95%   BMI 21.29 kg/m      PHYSICAL EXAM    GENERAL:NAD, no fevers, chills, no weakness no fatigue HEAD: Normocephalic, atraumatic.  EYES: Pupils equal, round, reactive to light. Extraocular muscles intact. No scleral icterus.  MOUTH: Moist mucosal membrane. Dentition intact. No abscess noted.  EAR, NOSE,  THROAT: Clear without exudates. No external lesions.  NECK: Supple. No thyromegaly. No nodules. No JVD.  PULMONARY: CTAb CARDIOVASCULAR: S1 and S2. Regular rate and rhythm. No murmurs, rubs, or gallops. No edema. Pedal pulses 2+ bilaterally.  GASTROINTESTINAL: Soft, nontender, nondistended. No masses. Positive bowel sounds. No hepatosplenomegaly.  MUSCULOSKELETAL: No swelling, clubbing, or edema. Range of motion full in all extremities.  NEUROLOGIC: Cranial nerves II through XII are intact. No gross focal neurological deficits. Sensation intact. Reflexes intact.  SKIN: No ulceration, lesions, rashes, or cyanosis. Skin warm and dry. Turgor intact.  PSYCHIATRIC: Mood, affect within normal limits. The patient is awake, alert and  oriented x 3. Insight, judgment intact.       IMAGING    DG Chest 2 View  Result Date: 11/11/2020 CLINICAL DATA:  Shortness of breath EXAM: CHEST - 2 VIEW COMPARISON:  None. FINDINGS: There is an area that appears masslike in the posterior segment of the left lower lobe measuring 6.8 x 6.5 x 6.4 cm. There is extensive airspace opacity throughout the left upper lobe as well. A small area of infiltrate is noted more inferiorly on the left. Right lung is clear. Heart size and pulmonary vascularity normal. Questionable adenopathy left hilum. No bone lesions. IMPRESSION: Suspected neoplastic focus in the superior segment left lower lobe measuring 6.8 x 6.5 x 6.4 cm. Apparent pneumonia throughout the left upper lobe. Ill-defined opacity left lower lobe may represent focal pneumonia, although on the frontal view, this area appears masslike and could represent a second potential neoplasm. This area on frontal view measures 3.7 x 3.1 cm. Advise chest CT for further assessment. Right lung clear. Heart size normal. Suspected left hilar adenopathy. Electronically Signed   By: Lowella Grip III M.D.   On: 11/11/2020 17:41   CT Angio Chest PE W and/or Wo Contrast  Result Date:  11/11/2020 CLINICAL DATA:  Lung mass. EXAM: CT ANGIOGRAPHY CHEST CT ABDOMEN AND PELVIS WITH CONTRAST TECHNIQUE: Multidetector CT imaging of the chest was performed using the standard protocol during bolus administration of intravenous contrast. Multiplanar CT image reconstructions and MIPs were obtained to evaluate the vascular anatomy. Multidetector CT imaging of the abdomen and pelvis was performed using the standard protocol during bolus administration of intravenous contrast. CONTRAST:  63m OMNIPAQUE IOHEXOL 350 MG/ML SOLN COMPARISON:  Chest x-ray from same day. FINDINGS: CTA CHEST FINDINGS Cardiovascular: Satisfactory opacification of the pulmonary arteries to the segmental level. No evidence of pulmonary embolism. Narrowing of the left main, lobar, and segmental pulmonary arteries due to tumor. Normal heart size. No pericardial effusion. Mediastinum/Nodes: Bulky mediastinal left hilar lymphadenopathy. Index prevascular lymph node measures 2.2 cm in short axis. Conglomerate subcarinal and left hilar lymphadenopathy and/or tumor, difficult to measure. Lower paraesophageal lymph node measuring 9 mm in short axis (series 4, image 81). Prominent left cardiophrenic angle lymph node measuring 7 mm in short axis (series 4, image 96). No pathologically enlarged right hilar or axillary lymph nodes. The thyroid gland, trachea, and esophagus demonstrate no significant findings. Lungs/Pleura: Large left perihilar and medial upper lobe mass, difficult to measure, with near complete occlusion of the central left upper and lower lobe bronchi. 7.5 x 4.1 cm mass predominantly within the superior segment of the left lower lobe, but crossing the major fissure. 1.2 x 2.6 cm subpleural nodule in the anterior left lower lobe (series 4, image 73). Patchy consolidation in the left upper lobe due to post obstructive pneumonia. Nodular interlobular septal thickening in the left upper lobe. Nodularity along the left major fissure  superiorly. Small to moderate partially loculated left pleural effusion. The right lung is clear. No pneumothorax. Musculoskeletal: No chest wall abnormality. No acute or significant osseous findings. Review of the MIP images confirms the above findings. CT ABDOMEN AND PELVIS FINDINGS Hepatobiliary: Few scattered subcentimeter low-density lesions within the liver, too small to characterize. The gallbladder is unremarkable. No biliary dilatation. Pancreas: Unremarkable. No pancreatic ductal dilatation or surrounding inflammatory changes. Spleen: Normal in size without focal abnormality. Adrenals/Urinary Tract: Adrenal glands are unremarkable. 1.3 cm left renal simple cyst. No renal calculi or hydronephrosis. The bladder is under distended. Stomach/Bowel: Stomach is within normal  limits. Appendix appears normal. No evidence of bowel wall thickening, distention, or inflammatory changes. Vascular/Lymphatic: No significant vascular findings are present. No enlarged abdominal or pelvic lymph nodes. Reproductive: Prostate is unremarkable. Other: Small fat containing left inguinal hernia. No free fluid or pneumoperitoneum. Musculoskeletal: No acute or significant osseous findings. Review of the MIP images confirms the above findings. IMPRESSION: CT chest: 1. Large left perihilar and medial upper lobe mass with mediastinal invasion, consistent with primary lung cancer. Metastases in the left lower lobe measuring up to 7.5 cm. 2. Left upper lobe lymphangitic carcinomatosis. Bulky mediastinal and left hilar nodal metastases. 3. Near complete occlusion of the central left upper and lower lobe bronchi. Left upper lobe postobstructive pneumonia. 4. No evidence of pulmonary embolism. Narrowing of the left pulmonary arteries due to tumor. 5. 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 CT abdomen pelvis: 1. No evidence of metastatic disease in the abdomen or pelvis. Electronically Signed   By: Titus Dubin M.D.   On: 11/11/2020 21:37   CT ABDOMEN PELVIS W CONTRAST  Result Date: 11/11/2020 CLINICAL DATA:  Lung mass. EXAM: CT ANGIOGRAPHY CHEST CT ABDOMEN AND PELVIS WITH CONTRAST TECHNIQUE: Multidetector CT imaging of the chest was performed using the standard protocol during bolus administration of intravenous contrast. Multiplanar CT image reconstructions and MIPs were obtained to evaluate the vascular anatomy. Multidetector CT imaging of the abdomen and pelvis was performed using the standard protocol during bolus administration of intravenous contrast. CONTRAST:  41m OMNIPAQUE IOHEXOL 350 MG/ML SOLN COMPARISON:  Chest x-ray from same day. FINDINGS: CTA CHEST FINDINGS Cardiovascular: Satisfactory opacification of the pulmonary arteries to the segmental level. No evidence of pulmonary embolism. Narrowing of the left main, lobar, and segmental pulmonary arteries due to tumor. Normal heart size. No pericardial effusion. Mediastinum/Nodes: Bulky mediastinal left hilar lymphadenopathy. Index prevascular lymph node measures 2.2 cm in short axis. Conglomerate subcarinal and left hilar lymphadenopathy and/or tumor, difficult to measure. Lower paraesophageal lymph node measuring 9 mm in short axis (series 4, image 81). Prominent left cardiophrenic angle lymph node measuring 7 mm in short axis (series 4, image 96). No pathologically enlarged right hilar or axillary lymph nodes. The thyroid gland, trachea, and esophagus demonstrate no significant findings. Lungs/Pleura: Large left perihilar and medial upper lobe mass, difficult to measure, with near complete occlusion of the central left upper and lower lobe bronchi. 7.5 x 4.1 cm mass predominantly within the superior segment of the left lower lobe, but crossing the major fissure. 1.2 x 2.6 cm subpleural nodule in the anterior left lower lobe (series 4, image 73). Patchy consolidation in the left upper lobe due to post obstructive pneumonia. Nodular interlobular  septal thickening in the left upper lobe. Nodularity along the left major fissure superiorly. Small to moderate partially loculated left pleural effusion. The right lung is clear. No pneumothorax. Musculoskeletal: No chest wall abnormality. No acute or significant osseous findings. Review of the MIP images confirms the above findings. CT ABDOMEN AND PELVIS FINDINGS Hepatobiliary: Few scattered subcentimeter low-density lesions within the liver, too small to characterize. The gallbladder is unremarkable. No biliary dilatation. Pancreas: Unremarkable. No pancreatic ductal dilatation or surrounding inflammatory changes. Spleen: Normal in size without focal abnormality. Adrenals/Urinary Tract: Adrenal glands are unremarkable. 1.3 cm left renal simple cyst. No renal calculi or hydronephrosis. The bladder is under distended. Stomach/Bowel: Stomach is within normal limits. Appendix appears normal. No evidence of bowel wall thickening, distention, or inflammatory changes. Vascular/Lymphatic: No significant vascular findings are present. No  enlarged abdominal or pelvic lymph nodes. Reproductive: Prostate is unremarkable. Other: Small fat containing left inguinal hernia. No free fluid or pneumoperitoneum. Musculoskeletal: No acute or significant osseous findings. Review of the MIP images confirms the above findings. IMPRESSION: CT chest: 1. Large left perihilar and medial upper lobe mass with mediastinal invasion, consistent with primary lung cancer. Metastases in the left lower lobe measuring up to 7.5 cm. 2. Left upper lobe lymphangitic carcinomatosis. Bulky mediastinal and left hilar nodal metastases. 3. Near complete occlusion of the central left upper and lower lobe bronchi. Left upper lobe postobstructive pneumonia. 4. No evidence of pulmonary embolism. Narrowing of the left pulmonary arteries due to tumor. 5. 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 CT abdomen pelvis: 1. No evidence of  metastatic disease in the abdomen or pelvis. Electronically Signed   By: Titus Dubin M.D.   On: 11/11/2020 21:37      ASSESSMENT/PLAN   Left lung pneumonia with lung mass -patient is chronic smoker, has family history of gastric cancer in paternal grandmother.   -he has constitutional symptoms for appx 3 months -he has not had hemoptysis -he is on empiric therapy  -clinically improved on zithromax and rocephin with solumedrol - present on admission  - COVID19 -never had infection , he had 3 vaccinations  - supplemental O2 during my evaluation room air - will perform infectious workup for pneumonial -serum fungitell -legionella ab -strep pneumoniae ur AG -Histoplasma Ur Ag -sputum resp cultures -AFB sputum expectorated specimen -sputum cytology  -reviewed pertinent imaging with patient today - ESR -PT/OT for d/c planning  -please encourage patient to use incentive spirometer few times each hour while hospitalized.    Probable COPD  - patient is on duoneb and steroids   - will continue prednisone 14m with taper   Left lung mass   - will perform infectious workup for fungal and atypical mycobacterial etiology  - cytology sputum stat  - discussed biopsy with patient he is agreeable.    Thank you for allowing me to participate in the care of this patient.    Patient/Family are satisfied with care plan and all questions have been answered.  This document was prepared using Dragon voice recognition software and may include unintentional dictation errors.     FOttie Glazier M.D.  Division of [employer]

## 2020-11-14 NOTE — Progress Notes (Signed)
No acute events overnight, condition remains stable.

## 2020-11-14 NOTE — Progress Notes (Signed)
Initial Nutrition Assessment  DOCUMENTATION CODES:  Severe malnutrition in context of acute illness/injury  INTERVENTION:   Continue regular diet  Ensure Enlive po TID, each supplement provides 350 kcal and 20 grams of protein  Multivitamin with minerals daily  Cholecalciferol 10000 IU daily for deficiency (5/14 labs)  Request measured weight  NUTRITION DIAGNOSIS:  Severe Malnutrition (in the context of acute illness) related to poor appetite as evidenced by moderate fat depletion,moderate muscle depletion.  GOAL:  Patient will meet greater than or equal to 90% of their needs  MONITOR:  PO intake,Supplement acceptance,Weight trends  REASON FOR ASSESSMENT:  Malnutrition Screening Tool    ASSESSMENT:  Pt presented to ED with dyspnea, productive cough, and wheezing worsening over the last few weeks. Has received two courses of antibiotics from PCP with no improvement in respiratory status. Workup in ED suggestive of pneumonia resulting in asthma exacerbation. PMH includes asthma, tobacco abuse, and GERD.  Noted pt had chest x-ray outpatient that showed suspected lung mass. Reports weight loss of about 35 pounds over the last 2-3 months.  Reports night sweats and fever. Being followed by pulmonology.   Pt resting in bed at the time of visit. Pt reports that appetite has been better since he has been admitted. States at home he has been eating 1 meal a day and sometimes snacks on small items (chips, fruit, etc). States that this has been his normal pattern for several months. Visibly depleted in muscle and fat stores. Pt reports weight prior to loss was ~175 lbs. Discussed need for increased intake to prevent further weight loss. Discussed high energy snack ideas and nutrition supplements. Pt agreeable to ensure.   Average Meal Intake: . 5/13-5/16: 86% intake x 4 recorded meals  Relevant Scheduled Meds: . multivitamin with minerals  1 tablet Oral Daily  . pantoprazole  40 mg Oral  Daily  . predniSONE  50 mg Oral Q breakfast   Relevant Continuous Infusions: . cefTRIAXone (ROCEPHIN)  IV 2 g (11/14/20 0502)   Relevant PRN Meds: magnesium hydroxide, ondansetron   Labs reviewed:   Vitamin D, 9.96 (5/14)  NUTRITION - FOCUSED PHYSICAL EXAM: Flowsheet Row Most Recent Value  Orbital Region Mild depletion  Upper Arm Region Moderate depletion  Thoracic and Lumbar Region Moderate depletion  Buccal Region Mild depletion  Temple Region Mild depletion  Clavicle Bone Region Moderate depletion  Clavicle and Acromion Bone Region Moderate depletion  Scapular Bone Region Moderate depletion  Dorsal Hand Mild depletion  Patellar Region Severe depletion  Anterior Thigh Region Severe depletion  Posterior Calf Region Severe depletion  Edema (RD Assessment) None  Hair Reviewed  Eyes Reviewed  Mouth Reviewed  Skin Reviewed  Nails Reviewed     Diet Order:   Diet Order            Diet regular Room service appropriate? Yes; Fluid consistency: Thin  Diet effective now                EDUCATION NEEDS:  Education needs have been addressed  Skin:  Skin Assessment: Reviewed RN Assessment  Last BM:  unsure  Height:  Ht Readings from Last 1 Encounters:  11/11/20 5\' 8"  (1.727 m)    Weight:  Wt Readings  11/11/20 63.5 kg  10/27/20 64.9 kg (143 lb) [facility]    Ideal Body Weight:  70 kg  BMI:  Body mass index is 21.29 kg/m.  Estimated Nutritional Needs:   Kcal:  2000-2200 kcal/d  Protein:  100-120g/d  Fluid:  >2L/d   Ranell Patrick, RD, LDN Clinical Dietitian Pager on [place]

## 2020-11-14 NOTE — Progress Notes (Signed)
Patient A & O and able to make needs known. AVS reviewed with patient who verbalized understanding via teach back re medications, follow up appointments, as well as limitations and restrictions. Patient mother, Vicente Weidler, will transport patient home via private vehicle.

## 2020-11-14 NOTE — Plan of Care (Signed)

## 2020-11-14 NOTE — Plan of Care (Signed)
  Problem: Education: Goal: Knowledge of General Education information will improve Description: Including pain rating scale, medication(s)/side effects and non-pharmacologic comfort measures Outcome: Progressing   Problem: Clinical Measurements: Goal: Ability to maintain clinical measurements within normal limits will improve Outcome: Progressing   Problem: Activity: Goal: Risk for activity intolerance will decrease Outcome: Progressing   Problem: Elimination: Goal: Will not experience complications related to bowel motility Outcome: Progressing   Problem: Safety: Goal: Ability to remain free from injury will improve Outcome: Progressing

## 2020-11-15 ENCOUNTER — Ambulatory Visit: Admission: RE | Admit: 2020-11-15 | Payer: Self-pay | Source: Ambulatory Visit

## 2020-11-15 LAB — LEGIONELLA PNEUMOPHILA SEROGP 1 UR AG: L. pneumophila Serogp 1 Ur Ag: NEGATIVE

## 2020-11-15 LAB — CULTURE, RESPIRATORY W GRAM STAIN
Culture: NORMAL
Gram Stain: NONE SEEN

## 2020-11-15 LAB — FUNGITELL, SERUM: Fungitell Result: <31 pg/mL (ref <80)

## 2020-11-17 ENCOUNTER — Other Ambulatory Visit: Payer: Self-pay | Admitting: Specialist

## 2020-11-17 DIAGNOSIS — J90 Pleural effusion, not elsewhere classified: Secondary | ICD-10-CM

## 2020-11-18 ENCOUNTER — Ambulatory Visit
Admission: RE | Admit: 2020-11-18 | Discharge: 2020-11-18 | Disposition: A | Discharge status: Home / Self Care | Payer: Medicaid Other | Source: Ambulatory Visit | Attending: Specialist | Admitting: Specialist

## 2020-11-18 ENCOUNTER — Ambulatory Visit
Admission: RE | Admit: 2020-11-18 | Discharge: 2020-11-18 | Disposition: A | Discharge status: Home / Self Care | Payer: Medicaid Other | Source: Ambulatory Visit | Attending: Radiology | Admitting: Radiology

## 2020-11-18 ENCOUNTER — Other Ambulatory Visit: Payer: Self-pay

## 2020-11-18 DIAGNOSIS — Z9889 Other specified postprocedural states: Secondary | ICD-10-CM

## 2020-11-18 DIAGNOSIS — J90 Pleural effusion, not elsewhere classified: Secondary | ICD-10-CM | POA: Insufficient documentation

## 2020-11-18 LAB — BODY FLUID CELL COUNT WITH DIFFERENTIAL
Eos, Fluid: 0 %
Lymphs, Fluid: 93 %
Monocyte-Macrophage-Serous Fluid: 3 %
Neutrophil Count, Fluid: 4 %
Total Nucleated Cell Count, Fluid: 4530 cu mm

## 2020-11-18 LAB — CULTURE, BLOOD (ROUTINE X 2)
Culture: NO GROWTH
Culture: NO GROWTH

## 2020-11-18 LAB — GLUCOSE, PLEURAL OR PERITONEAL FLUID: Glucose, Fluid: 95 mg/dL

## 2020-11-18 LAB — AMYLASE, PLEURAL OR PERITONEAL FLUID: Amylase, Fluid: 36 U/L

## 2020-11-18 LAB — PROTEIN, PLEURAL OR PERITONEAL FLUID: Total protein, fluid: 3.7 g/dL

## 2020-11-18 LAB — LACTATE DEHYDROGENASE, PLEURAL OR PERITONEAL FLUID: LD, Fluid: 345 U/L — ABNORMAL HIGH (ref 3–23)

## 2020-11-18 NOTE — Procedures (Signed)
Ultrasound-guided diagnostic and therapeutic left sided thoracentesis performed yielding 600 mililiters of dark amber colored fluid. No immediate complications.   Diagnostic fluid was sent to the lab for further analysis. Follow-up chest x-ray pending. EBL is < 50ml.

## 2020-11-21 LAB — PH, BODY FLUID: pH, Body Fluid: 7.4

## 2020-11-22 LAB — BODY FLUID CULTURE W GRAM STAIN: Culture: NO GROWTH

## 2020-11-22 LAB — ACID FAST SMEAR (AFB, MYCOBACTERIA): Acid Fast Smear: NEGATIVE

## 2020-11-23 LAB — CHOLESTEROL, BODY FLUID: Cholesterol, Fluid: 90 mg/dL

## 2020-11-23 LAB — CYTOLOGY - NON PAP

## 2020-11-25 ENCOUNTER — Emergency Department: Payer: Medicaid Other

## 2020-11-25 ENCOUNTER — Other Ambulatory Visit: Payer: Self-pay

## 2020-11-25 ENCOUNTER — Encounter: Payer: Self-pay | Admitting: Emergency Medicine

## 2020-11-25 ENCOUNTER — Inpatient Hospital Stay
Admission: EM | Admit: 2020-11-25 | Discharge: 2020-12-01 | DRG: 871 | Disposition: A | Discharge status: Home / Self Care | Payer: Medicaid Other | Attending: Internal Medicine | Admitting: Internal Medicine

## 2020-11-25 DIAGNOSIS — Z20822 Contact with and (suspected) exposure to covid-19: Secondary | ICD-10-CM | POA: Diagnosis present

## 2020-11-25 DIAGNOSIS — Z809 Family history of malignant neoplasm, unspecified: Secondary | ICD-10-CM

## 2020-11-25 DIAGNOSIS — E871 Hypo-osmolality and hyponatremia: Secondary | ICD-10-CM | POA: Diagnosis present

## 2020-11-25 DIAGNOSIS — R000 Tachycardia, unspecified: Secondary | ICD-10-CM | POA: Diagnosis present

## 2020-11-25 DIAGNOSIS — J449 Chronic obstructive pulmonary disease, unspecified: Secondary | ICD-10-CM | POA: Diagnosis present

## 2020-11-25 DIAGNOSIS — J95811 Postprocedural pneumothorax: Secondary | ICD-10-CM

## 2020-11-25 DIAGNOSIS — K219 Gastro-esophageal reflux disease without esophagitis: Secondary | ICD-10-CM | POA: Diagnosis present

## 2020-11-25 DIAGNOSIS — R0602 Shortness of breath: Secondary | ICD-10-CM | POA: Diagnosis present

## 2020-11-25 DIAGNOSIS — Z79899 Other long term (current) drug therapy: Secondary | ICD-10-CM | POA: Diagnosis not present

## 2020-11-25 DIAGNOSIS — J939 Pneumothorax, unspecified: Secondary | ICD-10-CM | POA: Diagnosis not present

## 2020-11-25 DIAGNOSIS — I4891 Unspecified atrial fibrillation: Secondary | ICD-10-CM | POA: Diagnosis present

## 2020-11-25 DIAGNOSIS — I4892 Unspecified atrial flutter: Secondary | ICD-10-CM

## 2020-11-25 DIAGNOSIS — F17200 Nicotine dependence, unspecified, uncomplicated: Secondary | ICD-10-CM | POA: Diagnosis present

## 2020-11-25 DIAGNOSIS — J440 Chronic obstructive pulmonary disease with acute lower respiratory infection: Secondary | ICD-10-CM | POA: Diagnosis present

## 2020-11-25 DIAGNOSIS — J189 Pneumonia, unspecified organism: Secondary | ICD-10-CM

## 2020-11-25 DIAGNOSIS — A419 Sepsis, unspecified organism: Principal | ICD-10-CM | POA: Diagnosis present

## 2020-11-25 DIAGNOSIS — J90 Pleural effusion, not elsewhere classified: Secondary | ICD-10-CM | POA: Diagnosis present

## 2020-11-25 DIAGNOSIS — J910 Malignant pleural effusion: Secondary | ICD-10-CM | POA: Diagnosis present

## 2020-11-25 DIAGNOSIS — C3432 Malignant neoplasm of lower lobe, left bronchus or lung: Secondary | ICD-10-CM | POA: Diagnosis present

## 2020-11-25 DIAGNOSIS — R918 Other nonspecific abnormal finding of lung field: Secondary | ICD-10-CM

## 2020-11-25 LAB — CBC
HCT: 39.9 % (ref 39.0–52.0)
Hemoglobin: 13.5 g/dL (ref 13.0–17.0)
MCH: 31.8 pg (ref 26.0–34.0)
MCHC: 33.8 g/dL (ref 30.0–36.0)
MCV: 93.9 fL (ref 80.0–100.0)
Platelets: 287 K/uL (ref 150–400)
RBC: 4.25 MIL/uL (ref 4.22–5.81)
RDW: 13.4 % (ref 11.5–15.5)
WBC: 20 K/uL — ABNORMAL HIGH (ref 4.0–10.5)
nRBC: 0 % (ref 0.0–0.2)

## 2020-11-25 LAB — BRAIN NATRIURETIC PEPTIDE: B Natriuretic Peptide: 22.6 pg/mL (ref 0.0–100.0)

## 2020-11-25 LAB — BASIC METABOLIC PANEL WITH GFR
Anion gap: 11 (ref 5–15)
BUN: 7 mg/dL (ref 6–20)
CO2: 26 mmol/L (ref 22–32)
Calcium: 8.8 mg/dL — ABNORMAL LOW (ref 8.9–10.3)
Chloride: 93 mmol/L — ABNORMAL LOW (ref 98–111)
Creatinine, Ser: 0.52 mg/dL — ABNORMAL LOW (ref 0.61–1.24)
GFR, Estimated: >60 mL/min
Glucose, Bld: 121 mg/dL — ABNORMAL HIGH (ref 70–99)
Potassium: 4.4 mmol/L (ref 3.5–5.1)
Sodium: 130 mmol/L — ABNORMAL LOW (ref 135–145)

## 2020-11-25 LAB — APTT: aPTT: 38 seconds — ABNORMAL HIGH (ref 24–36)

## 2020-11-25 LAB — RESP PANEL BY RT-PCR (FLU A&B, COVID) ARPGX2
Influenza A by PCR: NEGATIVE
Influenza B by PCR: NEGATIVE
SARS Coronavirus 2 by RT PCR: NEGATIVE

## 2020-11-25 LAB — LACTIC ACID, PLASMA: Lactic Acid, Venous: 1.2 mmol/L (ref 0.5–1.9)

## 2020-11-25 LAB — TROPONIN I (HIGH SENSITIVITY): Troponin I (High Sensitivity): 4 ng/L (ref <18)

## 2020-11-25 LAB — PROTIME-INR
INR: 1.1 (ref 0.8–1.2)
Prothrombin Time: 14.4 seconds (ref 11.4–15.2)

## 2020-11-25 MED ORDER — POLYETHYLENE GLYCOL 3350 17 G PO PACK: 17.0000 g | PACK | Freq: Every day | ORAL | Status: DC | PRN

## 2020-11-25 MED ORDER — SODIUM CHLORIDE 0.9 % IV SOLN
500.0000 mg | INTRAVENOUS | Status: AC
  Administered 2020-11-26 – 2020-11-29 (×4): 500 mg via INTRAVENOUS
  Filled 2020-11-25 (×4): qty 500

## 2020-11-25 MED ORDER — LACTATED RINGERS IV BOLUS
500.0000 mL | Freq: Once | INTRAVENOUS | Status: AC
  Administered 2020-11-25: 500 mL via INTRAVENOUS

## 2020-11-25 MED ORDER — IOHEXOL 350 MG/ML SOLN
75.0000 mL | Freq: Once | INTRAVENOUS | Status: AC | PRN
  Administered 2020-11-25: 75 mL via INTRAVENOUS

## 2020-11-25 MED ORDER — TRAZODONE HCL 50 MG PO TABS
50.0000 mg | ORAL_TABLET | Freq: Every evening | ORAL | Status: DC | PRN
  Administered 2020-11-27: 50 mg via ORAL
  Filled 2020-11-25: qty 1

## 2020-11-25 MED ORDER — SODIUM CHLORIDE 0.9 % IV SOLN
2.0000 g | Freq: Once | INTRAVENOUS | Status: AC
  Administered 2020-11-25: 2 g via INTRAVENOUS
  Filled 2020-11-25: qty 20

## 2020-11-25 MED ORDER — MORPHINE SULFATE (PF) 2 MG/ML IV SOLN
2.0000 mg | Freq: Once | INTRAVENOUS | Status: AC
Start: 2020-11-25 — End: 2020-11-25
  Administered 2020-11-25: 2 mg via INTRAVENOUS
  Filled 2020-11-25: qty 1

## 2020-11-25 MED ORDER — ADULT MULTIVITAMIN W/MINERALS CH
1.0000 | ORAL_TABLET | Freq: Every day | ORAL | Status: DC
  Administered 2020-11-26 – 2020-12-01 (×6): 1 via ORAL
  Filled 2020-11-25 (×6): qty 1

## 2020-11-25 MED ORDER — SODIUM CHLORIDE 0.9 % IV SOLN: 2.0000 g | INTRAVENOUS | Status: DC

## 2020-11-25 MED ORDER — IPRATROPIUM BROMIDE 0.02 % IN SOLN
0.5000 mg | Freq: Four times a day (QID) | RESPIRATORY_TRACT | Status: DC | PRN
  Administered 2020-11-25 – 2020-11-30 (×8): 0.5 mg via RESPIRATORY_TRACT
  Filled 2020-11-25 (×8): qty 2.5

## 2020-11-25 MED ORDER — SODIUM CHLORIDE 0.9 % IV SOLN
500.0000 mg | Freq: Once | INTRAVENOUS | Status: AC
  Administered 2020-11-25: 500 mg via INTRAVENOUS
  Filled 2020-11-25: qty 500

## 2020-11-25 MED ORDER — SODIUM CHLORIDE 0.9 % IV BOLUS
500.0000 mL | Freq: Once | INTRAVENOUS | Status: AC
  Administered 2020-11-25: 500 mL via INTRAVENOUS

## 2020-11-25 MED ORDER — HYDROCOD POLST-CPM POLST ER 10-8 MG/5ML PO SUER
5.0000 mL | Freq: Once | ORAL | Status: AC
Start: 2020-11-25 — End: 2020-11-25
  Administered 2020-11-25: 5 mL via ORAL
  Filled 2020-11-25: qty 5

## 2020-11-25 MED ORDER — PANTOPRAZOLE SODIUM 40 MG PO TBEC
40.0000 mg | DELAYED_RELEASE_TABLET | Freq: Every day | ORAL | Status: DC
  Administered 2020-11-26 – 2020-12-01 (×6): 40 mg via ORAL
  Filled 2020-11-25 (×6): qty 1

## 2020-11-25 MED ORDER — ONDANSETRON HCL 4 MG/2ML IJ SOLN
4.0000 mg | Freq: Four times a day (QID) | INTRAMUSCULAR | Status: DC | PRN
  Administered 2020-11-26: 4 mg via INTRAVENOUS
  Filled 2020-11-25: qty 2

## 2020-11-25 MED ORDER — MORPHINE SULFATE (PF) 4 MG/ML IV SOLN
4.0000 mg | INTRAVENOUS | Status: DC | PRN
  Administered 2020-11-25 – 2020-11-26 (×3): 4 mg via INTRAVENOUS
  Filled 2020-11-25 (×3): qty 1

## 2020-11-25 MED ORDER — DILTIAZEM HCL 25 MG/5ML IV SOLN
5.0000 mg | Freq: Once | INTRAVENOUS | Status: AC
  Administered 2020-11-25: 5 mg via INTRAVENOUS
  Filled 2020-11-25: qty 5

## 2020-11-25 MED ORDER — ENOXAPARIN SODIUM 40 MG/0.4ML IJ SOSY
40.0000 mg | PREFILLED_SYRINGE | INTRAMUSCULAR | Status: DC
  Administered 2020-11-26 – 2020-11-28 (×3): 40 mg via SUBCUTANEOUS
  Filled 2020-11-25 (×3): qty 0.4

## 2020-11-25 MED ORDER — ONDANSETRON HCL 4 MG PO TABS: 4.0000 mg | ORAL_TABLET | Freq: Four times a day (QID) | ORAL | Status: DC | PRN

## 2020-11-25 NOTE — ED Triage Notes (Signed)
Pt reports continued issues with pneumonia for the last several weeks and has some CP on the left side in his lungs and some SOB

## 2020-11-25 NOTE — ED Provider Notes (Signed)
[facility] Emergency Department Provider Note ____________________________________________   Event Date/Time   First MD Initiated Contact with Patient 11/25/20 1948     (approximate)  I have reviewed the triage vital signs and the nursing notes.   HISTORY  Chief Complaint Chest Pain and Shortness of Breath    HPI Curtis Cordts. is a 50 y.o. male history of possible lung cancer, recent pneumonia, pleural effusion  Also has extensive history of asthma  Patient reports for last 4 to 5 days he had increasing pain especially when he tries to breathe or cough or his left upper chest also having a slightly more productive cough.  No fever but reports he has any fevers "throughout" the last few weeks.  Reports he had some somewhat similar pain when he was in the hospital last time but this seems to be worse now.  The only thing that helps with some is if he takes prescription cough medicine but they have and then it still severe very sharp located his left upper chest  None some nausea no vomiting.  Decreased appetite poor intake feels dehydrated.  No abdominal pain.  No headache.  Reports he feels like he is becoming more weak and tired   Past Medical History:  Diagnosis Date  . Asthma     Patient Active Problem List   Diagnosis Date Noted  . Protein-calorie malnutrition, severe 11/14/2020  . CAP (community acquired pneumonia) 11/11/2020    History reviewed. No pertinent surgical history.  Prior to Admission medications   Medication Sig Start Date End Date Taking? Authorizing Provider  albuterol (VENTOLIN HFA) 108 (90 Base) MCG/ACT inhaler Inhale 1-2 puffs into the lungs every 4 (four) hours as needed for wheezing or shortness of breath. 11/14/20   Enzo Bi, MD  chlorpheniramine-HYDROcodone (TUSSIONEX) 10-8 MG/5ML SUER Take 5 mLs by mouth every 12 (twelve) hours as needed for cough. 11/09/20   [provider]  cholecalciferol (VITAMIN D) 25  MCG tablet Take 1 tablet (1,000 Units total) by mouth daily. Can take any over-the-counter. 11/14/20   Enzo Bi, MD  feeding supplement (ENSURE ENLIVE / ENSURE PLUS) LIQD Take 237 mLs by mouth 3 (three) times daily between meals. 11/14/20   Enzo Bi, MD  ipratropium-albuterol (DUONEB) 0.5-2.5 (3) MG/3ML SOLN Take 3 mLs by nebulization every 6 (six) hours as needed. 11/14/20   Enzo Bi, MD  Multiple Vitamin (MULTIVITAMIN WITH MINERALS) TABS tablet Take 1 tablet by mouth daily. 11/15/20   Enzo Bi, MD  omeprazole (PRILOSEC) 10 MG capsule Take 20 mg by mouth daily.    [provider]  predniSONE (DELTASONE) 20 MG tablet Take 40 mg (2 tablets) from 5/17 to 5/18, then 20 mg (1 tablet) from 5/19 to 5/22, then done. 11/15/20   Enzo Bi, MD    Allergies Patient has no known allergies.  No family history on file.  Social History Social History   Tobacco Use  . Smoking status: Current Every Day Smoker  . Smokeless tobacco: Never Used    Review of Systems Constitutional: No fever/chills Eyes: No visual changes. ENT: No sore throat. Cardiovascular: See HPI Respiratory: Denies shortness of breath. Gastrointestinal: No abdominal pain.   Genitourinary: Negative for dysuria. Musculoskeletal: Negative for back pain. Skin: Negative for rash. Neurological: Negative for headaches, areas of focal weakness or numbness.    ____________________________________________   PHYSICAL EXAM:  VITAL SIGNS: ED Triage Vitals  Enc Vitals Group     BP 11/25/20 1836 123/77  Pulse Rate 11/25/20 1836 (!) 138     Resp 11/25/20 1836 18     Temp 11/25/20 1836 98.2 F (36.8 C)     Temp Source 11/25/20 1836 Oral     SpO2 11/25/20 1836 98 %     Weight 11/25/20 1829 140 lb (63.5 kg)     Height 11/25/20 1829 5\' 8"  (1.727 m)     Head Circumference --      Peak Flow --      Pain Score 11/25/20 1828 9     Pain Loc --      Pain Edu? --      Excl. in [place]? --     Constitutional: Alert and oriented.   Mildly ill-appearing but in no obvious acute extremitas Eyes: Conjunctivae are normal. Head: Atraumatic. Nose: No congestion/rhinnorhea. Mouth/Throat: Mucous membranes are moist. Neck: No stridor.  Cardiovascular: Tachycardic rate, regular rhythm. Grossly normal heart sounds.  Good peripheral circulation. Respiratory: Normal oxygenation on room air.  Slight tachypnea.  Obvious pleuritic pain if he takes a deep breath Gastrointestinal: Soft and nontender. No distention. Musculoskeletal: No lower extremity tenderness nor edema. Neurologic:  Normal speech and language. No gross focal neurologic deficits are appreciated.  Skin:  Skin is warm, dry and intact. No rash noted. Psychiatric: Mood and affect are normal. Speech and behavior are normal.  ____________________________________________   LABS (all labs ordered are listed, but only abnormal results are displayed)  Labs Reviewed  BASIC METABOLIC PANEL - Abnormal; Notable for the following components:      Result Value   Sodium 130 (*)    Chloride 93 (*)    Glucose, Bld 121 (*)    Creatinine, Ser 0.52 (*)    Calcium 8.8 (*)    All other components within normal limits  CBC - Abnormal; Notable for the following components:   WBC 20.0 (*)    All other components within normal limits  APTT - Abnormal; Notable for the following components:   aPTT 38 (*)    All other components within normal limits  RESP PANEL BY RT-PCR (FLU A&B, COVID) ARPGX2  URINE CULTURE  CULTURE, BLOOD (ROUTINE X 2)  CULTURE, BLOOD (ROUTINE X 2)  LACTIC ACID, PLASMA  PROTIME-INR  BRAIN NATRIURETIC PEPTIDE  LACTIC ACID, PLASMA  URINALYSIS, COMPLETE (UACMP) WITH MICROSCOPIC  TROPONIN I (HIGH SENSITIVITY)   ____________________________________________  EKG  Initial EKG reviewed 1830 Heart rate 115 QRS 70 QTc 470 Atrial flutter primarily 2-1 conduction.  No obvious ischemia Repeat EKG performed at 2045 Heart rate 140 QRS 80 QTc 430 Sinus  tachycardia.  Favor sinus tachycardia on this EKG.  No obvious evidence of acute ischemia. ____________________________________________  RADIOLOGY  DG Chest 2 View  Result Date: 11/25/2020 CLINICAL DATA:  Chest pain and shortness of breath EXAM: CHEST - 2 VIEW COMPARISON:  Chest radiograph 11/18/2020, chest CT 11/11/2020 FINDINGS: Unchanged cardiomediastinal silhouette. There is increased opacification of the left upper lung, with unchanged appearance of the left perihilar and left lower lobe mass. Small left pleural effusion. No visible pneumothorax. IMPRESSION: Increased opacification of the left upper lung with known left perihilar and left lower lobe masses, consistent with worsening postobstructive pneumonia. Small left pleural effusion. Electronically Signed   By: Maurine Simmering   On: 11/25/2020 19:04   CT Angio Chest PE W and/or Wo Contrast  Result Date: 11/25/2020 CLINICAL DATA:  History of pneumonia left-sided chest pain EXAM: CT ANGIOGRAPHY CHEST WITH CONTRAST TECHNIQUE: Multidetector CT imaging of the chest was  performed using the standard protocol during bolus administration of intravenous contrast. Multiplanar CT image reconstructions and MIPs were obtained to evaluate the vascular anatomy. CONTRAST:  11mL OMNIPAQUE IOHEXOL 350 MG/ML SOLN COMPARISON:  Chest x-ray 11/25/2020, CT chest 11/11/2020 FINDINGS: Cardiovascular: Satisfactory opacification of the pulmonary arteries to the segmental level. No evidence of pulmonary embolism. Encasement and diffuse narrowing of left-sided pulmonary vasculature secondary to lung mass. Normal cardiac size. Trace pericardial effusion. Mediastinum/Nodes: No thyroid mass. Mild narrowing of left mainstem bronchus with occlusion of left upper and lower lobe bronchi by tumor. Redemonstrated bulky mediastinal and left hilar adenopathy. Index prevascular node measuring 2.1 cm, 2.2 cm previously. Conglomerate subcarinal and left hilar adenopathy versus tumor  infiltration. Enlarged left cardio phrenic node up to 8 mm, previously 7 mm. Esophageal displacement to the right by mediastinal adenopathy/mass, esophagus cannot be clearly separated from bulky mediastinal mass. Distal esophageal nodes measuring up to 12 mm. Lungs/Pleura: Moderate partially loculated left pleural effusion as before. Large poorly defined medial left upper lobe and left hilar lung mass with mediastinal invasion and or poorly defined adenopathy. Large presumed metastatic mass mostly within the left lower lobe, measuring 8.7 by 5.8 cm, previously 7.5 x 4.1 cm. Subpleural anterior left lower lobe lung mass measures 2.9 x 1.8 cm, previously 2.6 x 1.2 cm, series 6, image 78. Extensive ground-glass density and patchy consolidation in the left upper lobe consistent with postobstructive pneumonia. Mild nodularity along the major fissure. Slightly worsened ground-glass density in the left lower lobe and surrounding the dominant left lower lobe lung mass. New or increased irregular nodularity in the left lateral lung base, series 6, image 77. Upper Abdomen: No acute abnormality. Subcentimeter low-density lesions in the liver too small to further characterize. Musculoskeletal: No acute osseous abnormality Review of the MIP images confirms the above findings. IMPRESSION: 1. Negative for acute pulmonary embolus. Diffuse narrowing and encasement of left-sided pulmonary vasculature by large lung mass. 2. Large poorly defined left upper lobe and hilar lung mass with bulky mediastinal and left hilar metastatic disease. Possible mediastinal invasion by large left upper lobe lung mass. Occlusion of left upper and lower lobe bronchi by tumor. Metastatic lesions in the left lower lobe measuring up to 8.7 cm, increased compared to prior CT. Slight worsening of diffuse ground-glass density in the left lower lobe, likely worsening postobstructive pneumonia. Continued extensive consolidation and ground-glass densities in the  left upper lobe, also consistent with postobstructive pneumonia. Redemonstrated fissural nodularity in the left upper lobe raising concern for metastatic disease. Moderate partially loculated left-sided pleural effusion. Electronically Signed   By: Donavan Foil M.D.   On: 11/25/2020 21:04       Personally viewed the patient's CT chest as well as checks x-ray, concerning findings.  See full reports per radiologist but overall the patient seems to have apparent mass possible associated infiltrate or post obstruction changes primarily involving the left lung as well as pleural effusion  CT negative for acute PE today.  There is however concerning left-sided narrowing and encasement of the left pulmonary vasculature.  In addition there are concerning findings for postobstructive pneumonia.  Full report as above with multiple details ____________________________________________   PROCEDURES  Procedure(s) performed: None  Procedures  Critical Care performed: Yes, see critical care note(s)  CRITICAL CARE Performed by: Delman Kitten   Total critical care time: 30 minutes  Critical care time was exclusive of separately billable procedures and treating other patients.  Critical care was necessary to treat or prevent  imminent or life-threatening deterioration.  Critical care was time spent personally by me on the following activities: development of treatment plan with patient and/or surrogate as well as nursing, discussions with consultants, evaluation of patient's response to treatment, examination of patient, obtaining history from patient or surrogate, ordering and performing treatments and interventions, ordering and review of laboratory studies, ordering and review of radiographic studies, pulse oximetry and re-evaluation of patient's condition.  Patient presents with pleuritic chest pain extreme tachycardia, a flutter, pleuritic chest pain.  Patient required emergent evaluation for  stabilization and diagnostic evaluation. ____________________________________________   INITIAL IMPRESSION / ASSESSMENT AND PLAN / ED COURSE  Pertinent labs & imaging results that were available during my care of the patient were reviewed by me and considered in my medical decision making (see chart for details).   Chest pain, pleuritic in nature left-sided.  EKG with associated atrial flutter.  This was responsive to fluids and pain control.  He did convert to sinus tachycardia in the ER.  Pain improved with treatment here.  He does not have an oxygen deficit, but in many respects high concern for pulmonary embolism with his new tachycardia a flutter, CT angiogram performed.  Discussed with pulmonary.  No associate abdominal pain no fever.  White count 20,000 however is concerning also for possible infectious etiologies.  Blood cultures ordered.  Antibiotics ordered.  Antibiotic coverage as per recommendation of our pulmonologist  Clinical Course as of 11/25/20 2149  Fri Nov 25, 2020  2023 Patient noted to have frequent productive cough. [MQ]  2023 Discussed case, imaging today presentation pleuritic pain with Dr. Lanney Gins.  He recommends initiate back on Rocephin and azithromycin.  Reviewed his recent work-up including his thoracentesis and visit with Dr. Raul Del.  We will rehospitalize, pain control, and will obtain CT angiogram to exclude development of pulmonary embolism or worsening intrathoracic process given the associated worsening left-sided pleuritic pain over the last 4 to 5 days. [MQ]    Clinical Course User Index [MQ] Delman Kitten, MD    ----------------------------------------- 9:49 PM on 11/25/2020 -----------------------------------------  Patient admitted to Dr. Eckstat____________________________________________   FINAL CLINICAL IMPRESSION(S) / ED DIAGNOSES  Final diagnoses:  Lung mass  Community acquired pneumonia of left lung, unspecified part of lung  Sepsis, due  to unspecified organism, unspecified whether acute organ dysfunction present [facility])  Atrial flutter, unspecified type [facility])        Note:  This document was prepared using Dragon voice recognition software and may include unintentional dictation errors       Delman Kitten, MD 11/25/20 2149

## 2020-11-25 NOTE — ED Notes (Signed)
Patient transported to CT 

## 2020-11-25 NOTE — H&P (Addendum)
[facility] History and Physical  Curtis Manning. UXN:[id number] DOB: Jun 25, 1971 DOA: 11/25/2020  Referring physician: Dr. Jacqualine Manning PCP: System, Provider Not In   Chief Complaint: chest pain  HPI: Curtis Manning. is a 50 y.o. male with history of COPD, GERD, tobacco use, and recently discovered left lung mass highly suspicious for malignancy, presents with chest pain.  Patient saw provider in urgent care on April 19 for several weeks of shortness of breath and cough.  At that time he was diagnosed with a COPD exacerbation and given azithromycin and prednisone.  He then returned to the urgent care on April 28 reporting that he had had only a short improvement of his symptoms and was again feeling short of breath.  He also reported weight loss and decreased appetite at that time.  At that time a chest x-ray was obtained which showed a left lung mass highly suspicious for malignancy, he was given levofloxacin and an urgent referral was made to pulmonology.  He had a consultation with Dr. Raul Manning at the [facility] on May 3, a CT chest with contrast was ordered.  Prior to this being obtained he presented to the urgent care on May 13 again with the same symptoms and was referred to the ED and ultimately admitted to [facility] from May 13 to 16.  During that admission he underwent a CT chest abdomen and pelvis which showed a large left perihilar and medial upper lobe mass with mediastinal invasion, as well as loculated malignant pleural effusion, pulmonary metastases and postobstructive pneumonia.  He was treated with antibiotics and steroids and discharged home.  He followed up with pulmonology on May 18, again suspicion was very high for malignancy and he was referred to [facility] as well has for a thoracentesis.  He had a thoracentesis performed on May 20 that was uncomplicated, cytology was negative for malignancy and remainder of studies were largely unremarkable.  Today patient presents reporting new  onset of left upper chest pain that radiates somewhat down into the left upper quadrant of his abdomen.  He continues to endorse significant shortness of breath as well as an extremely painful cough.  He understands that the mass in his lung may be cancer but has not been told that this is what it is for sure.  In the ED initial vital signs notable for tachycardia ranging from the 130s to the 160s, tachypnea in the low 20s, and normal blood pressure and oxygen saturation.  Labs showed sodium 130, normal creatinine, WBC 20 but remainder of CBC normal, lactic acid normal, troponin normal, BNP normal.  COVID swab was negative.  EKG initially showed a flutter, followed by sinus tachycardia, followed by reversion to likely a flutter versus A. fib.  Given new onset chest pain and high suspicion for malignancy PE was a strong consideration and patient was sent for CTPA.  Scan was negative for acute PE but did show significant narrowing of the left-sided pulmonary vasculature due to his lung mass.  There is also worsening of postobstructive pneumonia in the left lower lobe and left upper lobe.  Case was discussed with Dr. Lanney Manning from pulmonary and it was recommended that he be treated with azithromycin and ceftriaxone.  Given negative CTPA he recommended admission for pain control and further work-up and management.  Review of Systems:  Pertinent positives and negative per HPI, all others reviewed and negative  Past Medical History:  Diagnosis Date   Asthma    History reviewed. No pertinent surgical  history. Social History:  reports that he has been smoking. He has never used smokeless tobacco. No history on file for alcohol use and drug use.  No Known Allergies  No family history on file.   Prior to Admission medications   Medication Sig Start Date End Date Taking? Authorizing Provider  albuterol (VENTOLIN HFA) 108 (90 Base) MCG/ACT inhaler Inhale 1-2 puffs into the lungs every 4 (four) hours as  needed for wheezing or shortness of breath. 11/14/20   Enzo Bi, MD  chlorpheniramine-HYDROcodone (TUSSIONEX) 10-8 MG/5ML SUER Take 5 mLs by mouth every 12 (twelve) hours as needed for cough. 11/09/20   [provider]  cholecalciferol (VITAMIN D) 25 MCG tablet Take 1 tablet (1,000 Units total) by mouth daily. Can take any over-the-counter. 11/14/20   Enzo Bi, MD  feeding supplement (ENSURE ENLIVE / ENSURE PLUS) LIQD Take 237 mLs by mouth 3 (three) times daily between meals. 11/14/20   Enzo Bi, MD  ipratropium-albuterol (DUONEB) 0.5-2.5 (3) MG/3ML SOLN Take 3 mLs by nebulization every 6 (six) hours as needed. 11/14/20   Enzo Bi, MD  Multiple Vitamin (MULTIVITAMIN WITH MINERALS) TABS tablet Take 1 tablet by mouth daily. 11/15/20   Enzo Bi, MD  omeprazole (PRILOSEC) 10 MG capsule Take 20 mg by mouth daily.    [provider]  predniSONE (DELTASONE) 20 MG tablet Take 40 mg (2 tablets) from 5/17 to 5/18, then 20 mg (1 tablet) from 5/19 to 5/22, then done. 11/15/20   Enzo Bi, MD   Physical Exam: Vitals:   11/25/20 2045 11/25/20 2100 11/25/20 2130 11/25/20 2200  BP: (!) 141/84 104/87 117/75 108/81  Pulse: (!) 160 (!) 139 (!) 159 (!) 137  Resp: 20 19 (!) 21 17  Temp:      TempSrc:      SpO2: 94% 94% 95% 95%  Weight:      Height:        Wt Readings from Last 3 Encounters:  11/25/20 63.5 kg  11/14/20 63 kg     General:  Ill appearing Eyes: PERRL, normal lids, irises & conjunctiva ENT: grossly normal hearing, lips & tongue Neck: no masses Cardiovascular: tachycardic, no m/r/g. No LE edema.  Respiratory: Increased work of breathing but able to speak in complete sentences.  Reduced breath sounds on left side though still present, no crackles or wheezes. Abdomen: soft, ntnd Skin: no rash or induration seen on limited exam Musculoskeletal: grossly normal tone BUE/BLE Psychiatric: grossly normal mood and affect, speech fluent and appropriate Neurologic: grossly non-focal.           Labs on Admission:  Basic Metabolic Panel: Recent Labs  Lab 11/25/20 1830  NA 130*  K 4.4  CL 93*  CO2 26  GLUCOSE 121*  BUN 7  CREATININE 0.52*  CALCIUM 8.8*   Liver Function Tests: No results for input(s): AST, ALT, ALKPHOS, BILITOT, PROT, ALBUMIN in the last 168 hours. No results for input(s): LIPASE, AMYLASE in the last 168 hours. No results for input(s): AMMONIA in the last 168 hours. CBC: Recent Labs  Lab 11/25/20 1830  WBC 20.0*  HGB 13.5  HCT 39.9  MCV 93.9  PLT 287   Cardiac Enzymes: No results for input(s): CKTOTAL, CKMB, CKMBINDEX, TROPONINI in the last 168 hours.  BNP (last 3 results) Recent Labs    11/25/20 2016  BNP 22.6    ProBNP (last 3 results) No results for input(s): PROBNP in the last 8760 hours.  CBG: No results for input(s): GLUCAP in  the last 168 hours.  Radiological Exams on Admission: DG Chest 2 View  Result Date: 11/25/2020 CLINICAL DATA:  Chest pain and shortness of breath EXAM: CHEST - 2 VIEW COMPARISON:  Chest radiograph 11/18/2020, chest CT 11/11/2020 FINDINGS: Unchanged cardiomediastinal silhouette. There is increased opacification of the left upper lung, with unchanged appearance of the left perihilar and left lower lobe mass. Small left pleural effusion. No visible pneumothorax. IMPRESSION: Increased opacification of the left upper lung with known left perihilar and left lower lobe masses, consistent with worsening postobstructive pneumonia. Small left pleural effusion. Electronically Signed   By: Maurine Simmering   On: 11/25/2020 19:04   CT Angio Chest PE W and/or Wo Contrast  Result Date: 11/25/2020 CLINICAL DATA:  History of pneumonia left-sided chest pain EXAM: CT ANGIOGRAPHY CHEST WITH CONTRAST TECHNIQUE: Multidetector CT imaging of the chest was performed using the standard protocol during bolus administration of intravenous contrast. Multiplanar CT image reconstructions and MIPs were obtained to evaluate the vascular  anatomy. CONTRAST:  2mL OMNIPAQUE IOHEXOL 350 MG/ML SOLN COMPARISON:  Chest x-ray 11/25/2020, CT chest 11/11/2020 FINDINGS: Cardiovascular: Satisfactory opacification of the pulmonary arteries to the segmental level. No evidence of pulmonary embolism. Encasement and diffuse narrowing of left-sided pulmonary vasculature secondary to lung mass. Normal cardiac size. Trace pericardial effusion. Mediastinum/Nodes: No thyroid mass. Mild narrowing of left mainstem bronchus with occlusion of left upper and lower lobe bronchi by tumor. Redemonstrated bulky mediastinal and left hilar adenopathy. Index prevascular node measuring 2.1 cm, 2.2 cm previously. Conglomerate subcarinal and left hilar adenopathy versus tumor infiltration. Enlarged left cardio phrenic node up to 8 mm, previously 7 mm. Esophageal displacement to the right by mediastinal adenopathy/mass, esophagus cannot be clearly separated from bulky mediastinal mass. Distal esophageal nodes measuring up to 12 mm. Lungs/Pleura: Moderate partially loculated left pleural effusion as before. Large poorly defined medial left upper lobe and left hilar lung mass with mediastinal invasion and or poorly defined adenopathy. Large presumed metastatic mass mostly within the left lower lobe, measuring 8.7 by 5.8 cm, previously 7.5 x 4.1 cm. Subpleural anterior left lower lobe lung mass measures 2.9 x 1.8 cm, previously 2.6 x 1.2 cm, series 6, image 78. Extensive ground-glass density and patchy consolidation in the left upper lobe consistent with postobstructive pneumonia. Mild nodularity along the major fissure. Slightly worsened ground-glass density in the left lower lobe and surrounding the dominant left lower lobe lung mass. New or increased irregular nodularity in the left lateral lung base, series 6, image 77. Upper Abdomen: No acute abnormality. Subcentimeter low-density lesions in the liver too small to further characterize. Musculoskeletal: No acute osseous abnormality  Review of the MIP images confirms the above findings. IMPRESSION: 1. Negative for acute pulmonary embolus. Diffuse narrowing and encasement of left-sided pulmonary vasculature by large lung mass. 2. Large poorly defined left upper lobe and hilar lung mass with bulky mediastinal and left hilar metastatic disease. Possible mediastinal invasion by large left upper lobe lung mass. Occlusion of left upper and lower lobe bronchi by tumor. Metastatic lesions in the left lower lobe measuring up to 8.7 cm, increased compared to prior CT. Slight worsening of diffuse ground-glass density in the left lower lobe, likely worsening postobstructive pneumonia. Continued extensive consolidation and ground-glass densities in the left upper lobe, also consistent with postobstructive pneumonia. Redemonstrated fissural nodularity in the left upper lobe raising concern for metastatic disease. Moderate partially loculated left-sided pleural effusion. Electronically Signed   By: Donavan Foil M.D.   On: 11/25/2020 21:04  EKG: Independently reviewed.  Sinus tachycardia, multiple PVCs, right axis deviation, no ischemic changes.  Assessment/Plan Active Problems:   Lung mass  Curtis Manning. is a 50 y.o. male with history of COPD, GERD, tobacco use, and recently discovered left lung mass highly suspicious for malignancy, presents with chest pain.  #Left lung mass, suspicious for malignancy #Post-obstructive pneumonia #Tachyarrythmia, Aflutter vs Afib #COPD #Sepsis Patient ill-appearing on exam. Given vital signs and suspicion for post obstructive pneumonia patient meets criteria for sepsis. Significant tachycardia, imaging negative for pulmonary embolism however mass-effect is such on his pulmonary vasculature that his physical response is much the same.  Given a dose of diltiazem to see if this would assist with rate control however this caused a decrease in his blood pressure and I will not pursue this further at the  moment.  Ultimately treating the underlying cause will be the most effective way of managing his tachycardia.  Had a frank discussion with patient regarding likely etiology of the mass.  Introduced the concept of palliative care and recommended that patient and his wife see them tomorrow both to get better control of his symptoms as well as for discussion of goals of care, they seemed amenable to this. - Telemetry, admit to stepdown - Continue ceftriaxone and azithromycin for total 5 days - DuoNebs as needed - Defer further steroids at this time - Morphine 4 mg every 4 hours as needed - Formal pulmonology consult in the a.m. - Oncology consult in a.m. - Palliative consult in a.m. - Nutrition consult  #Hyponatremia Mild, however given unknown etiology of mass raises possibility of neoplastic syndrome.  #Known medical problems GERD-continue PPI  Manning Status: Full Manning, presumed DVT Prophylaxis: Lovenox Family Communication: wife updated at bedside Disposition Plan: Inpatient, Step Down   Time spent: 9 min  Clarnce Flock MD/[facility]  Note:  This document was prepared using Systems analyst and may include unintentional dictation errors.

## 2020-11-26 ENCOUNTER — Encounter: Payer: Self-pay | Admitting: Family Medicine

## 2020-11-26 DIAGNOSIS — R918 Other nonspecific abnormal finding of lung field: Secondary | ICD-10-CM

## 2020-11-26 DIAGNOSIS — R000 Tachycardia, unspecified: Secondary | ICD-10-CM

## 2020-11-26 DIAGNOSIS — R590 Localized enlarged lymph nodes: Secondary | ICD-10-CM

## 2020-11-26 DIAGNOSIS — J90 Pleural effusion, not elsewhere classified: Secondary | ICD-10-CM | POA: Diagnosis present

## 2020-11-26 DIAGNOSIS — F17210 Nicotine dependence, cigarettes, uncomplicated: Secondary | ICD-10-CM

## 2020-11-26 DIAGNOSIS — J449 Chronic obstructive pulmonary disease, unspecified: Secondary | ICD-10-CM | POA: Diagnosis present

## 2020-11-26 DIAGNOSIS — R0789 Other chest pain: Secondary | ICD-10-CM

## 2020-11-26 DIAGNOSIS — K219 Gastro-esophageal reflux disease without esophagitis: Secondary | ICD-10-CM | POA: Diagnosis present

## 2020-11-26 LAB — CBC
HCT: 37.4 % — ABNORMAL LOW (ref 39.0–52.0)
Hemoglobin: 12.7 g/dL — ABNORMAL LOW (ref 13.0–17.0)
MCH: 31.4 pg (ref 26.0–34.0)
MCHC: 34 g/dL (ref 30.0–36.0)
MCV: 92.6 fL (ref 80.0–100.0)
Platelets: 264 10*3/uL (ref 150–400)
RBC: 4.04 MIL/uL — ABNORMAL LOW (ref 4.22–5.81)
RDW: 13.4 % (ref 11.5–15.5)
WBC: 22.8 10*3/uL — ABNORMAL HIGH (ref 4.0–10.5)
nRBC: 0 % (ref 0.0–0.2)

## 2020-11-26 LAB — BASIC METABOLIC PANEL
Anion gap: 13 (ref 5–15)
BUN: 10 mg/dL (ref 6–20)
CO2: 23 mmol/L (ref 22–32)
Calcium: 8.7 mg/dL — ABNORMAL LOW (ref 8.9–10.3)
Chloride: 95 mmol/L — ABNORMAL LOW (ref 98–111)
Creatinine, Ser: 0.59 mg/dL — ABNORMAL LOW (ref 0.61–1.24)
GFR, Estimated: >60 mL/min (ref >60)
Glucose, Bld: 111 mg/dL — ABNORMAL HIGH (ref 70–99)
Potassium: 4.3 mmol/L (ref 3.5–5.1)
Sodium: 131 mmol/L — ABNORMAL LOW (ref 135–145)

## 2020-11-26 MED ORDER — OXYCODONE-ACETAMINOPHEN 5-325 MG PO TABS
1.0000 | ORAL_TABLET | ORAL | Status: DC | PRN
  Administered 2020-11-26 – 2020-12-01 (×16): 2 via ORAL
  Filled 2020-11-26 (×16): qty 2

## 2020-11-26 MED ORDER — SODIUM CHLORIDE 0.9 % IV SOLN
3.0000 g | Freq: Four times a day (QID) | INTRAVENOUS | Status: DC
  Administered 2020-11-26 – 2020-12-01 (×20): 3 g via INTRAVENOUS
  Filled 2020-11-26: qty 3
  Filled 2020-11-26: qty 8
  Filled 2020-11-26 (×3): qty 3
  Filled 2020-11-26 (×2): qty 8
  Filled 2020-11-26: qty 3
  Filled 2020-11-26 (×5): qty 8
  Filled 2020-11-26 (×2): qty 3
  Filled 2020-11-26: qty 8
  Filled 2020-11-26: qty 3
  Filled 2020-11-26 (×5): qty 8
  Filled 2020-11-26: qty 3

## 2020-11-26 MED ORDER — METOPROLOL TARTRATE 5 MG/5ML IV SOLN
2.5000 mg | Freq: Four times a day (QID) | INTRAVENOUS | Status: DC
  Administered 2020-11-26 – 2020-11-27 (×5): 2.5 mg via INTRAVENOUS
  Filled 2020-11-26 (×5): qty 5

## 2020-11-26 MED ORDER — DM-GUAIFENESIN ER 30-600 MG PO TB12
1.0000 | ORAL_TABLET | Freq: Two times a day (BID) | ORAL | Status: DC
  Administered 2020-11-26 – 2020-12-01 (×11): 1 via ORAL
  Filled 2020-11-26 (×11): qty 1

## 2020-11-26 MED ORDER — DEXAMETHASONE 4 MG PO TABS
4.0000 mg | ORAL_TABLET | Freq: Two times a day (BID) | ORAL | Status: DC
  Administered 2020-11-26 – 2020-12-01 (×11): 4 mg via ORAL
  Filled 2020-11-26 (×13): qty 1

## 2020-11-26 MED ORDER — LEVALBUTEROL HCL 0.63 MG/3ML IN NEBU
0.6300 mg | INHALATION_SOLUTION | Freq: Four times a day (QID) | RESPIRATORY_TRACT | Status: DC | PRN
  Administered 2020-11-26 – 2020-11-29 (×6): 0.63 mg via RESPIRATORY_TRACT
  Filled 2020-11-26 (×6): qty 3

## 2020-11-26 NOTE — Assessment & Plan Note (Addendum)
#  50 year old male patient with history of smoking is currently in the hospital for worsening left chest wall pain/shortness of breath; CT scan-left upper lobe lung mass/mediastinal adenopathy  # Left upper lobe mass/mediastinal adenopathy-highly concerning for lung malignancy.  Likely suggestive of stage III malignancy-however CT-guided biopsy-necrotic material/nondiagnostic.  Discussed with patient that he will need a PET scan.  We will also discuss with Dr.Aleskerov.   #Paroxysmal A. Fib-stable sinus rhythm.  Above plan of care discussed with patient in detail.  Patient recontacted by nurse navigator regarding PET scan/consideration of biopsy; oncology follow-up.

## 2020-11-26 NOTE — ED Notes (Signed)
Breathing treatment complete.  Patient verbalizes that he feels much better.

## 2020-11-26 NOTE — Progress Notes (Signed)
PROGRESS NOTE    Curtis Manning.  LKG:[id number] DOB: 1971-04-05 DOA: 11/25/2020 PCP: System, Provider Not In    Brief Narrative:  50 year old male with history of COPD and tobacco use, recent diagnosis of large left lung mass that was diagnosed approximately 2 weeks ago.  He returns to the hospital with significant pain and shortness of breath in his chest.  CT scan was repeated that did not show PE, but showed enlarging lung mass.  He was admitted for further work-up.  Oncology consulted.   Assessment & Plan:   Active Problems:   Obstructive pneumonia   Lung mass   Pleural effusion on left   GERD (gastroesophageal reflux disease)   COPD (chronic obstructive pulmonary disease) (HCC)   Sinus tachycardia   Left lung mass, suspicious for malignancy -This was initially noted on CT scan approximately 2 weeks ago -Repeat CT scan during this admission shows slight enlargement -Mass appears to be compressing left pulmonary vasculature as well as occluding the left lower lobe and upper lobe bronchi -Discussed with oncology, Dr. Tish Men who will see in consult -Patient will need IR guided biopsy -Discussed with pulmonology, Dr. Lanney Gins who agreed with plan and also recommended Decadron 4mg  twice daily  Postobstructive pneumonia -Noted on CT -He does report some cough -Started on Unasyn and azithromycin  Left pleural effusion -Recent thoracentesis on 5/20 did not show any evidence of malignant cells -Possibly parapneumonic effusion -Fortunately, he does not appear to be short of breath and is on room air at this time  Sinus tachycardia -CT chest negative for PE -Suspect this is likely physiologic response to underlying pulmonary issues -will use low dose metoprolol as blood pressure tolerates  COPD -He is not wheezing at this time -Continue bronchodilators as needed  GERD -Continue on PPI   DVT prophylaxis: enoxaparin (LOVENOX) injection 40 mg Start: 11/25/20  2215 SCDs Start: 11/25/20 2210  Code Status: full code Family Communication: discussed with patient Disposition Plan: Status is: Inpatient  Remains inpatient appropriate because:IV treatments appropriate due to intensity of illness or inability to take PO and Inpatient level of care appropriate due to severity of illness   Dispo: The patient is from: Home              Anticipated d/c is to: Home              Patient currently is not medically stable to d/c.   Difficult to place patient No   Consultants:   oncology  Procedures:     Antimicrobials:   unasyn 5/28>  Azithromycin 5/28>    Subjective: He has pain in his left chest and left upper back, worse when he takes a deep breath or coughs  Objective: Vitals:   11/26/20 1130 11/26/20 1400 11/26/20 1450 11/26/20 1640  BP: 120/83 125/68 131/78 121/71  Pulse: (!) 119 (!) 120 (!) 131 (!) 128  Resp: 18 20 19 18   Temp:   98.3 F (36.8 C) 98.2 F (36.8 C)  TempSrc:   Oral   SpO2: 97% 96% 97% 93%  Weight:   63.3 kg   Height:   5\' 8"  (1.727 m)     Intake/Output Summary (Last 24 hours) at 11/26/2020 1800 Last data filed at 11/26/2020 1129 Gross per 24 hour  Intake 600 ml  Output --  Net 600 ml   Filed Weights   11/25/20 1829 11/26/20 1450  Weight: 63.5 kg 63.3 kg    Examination:  General exam: Appears calm and comfortable  Respiratory system: Clear to auscultation. Respiratory effort normal. Cardiovascular system: S1 & S2 heard, tachycardic. No JVD, murmurs, rubs, gallops or clicks. No pedal edema. Gastrointestinal system: Abdomen is nondistended, soft and nontender. No organomegaly or masses felt. Normal bowel sounds heard. Central nervous system: Alert and oriented. No focal neurological deficits. Extremities: Symmetric 5 x 5 power. Skin: No rashes, lesions or ulcers Psychiatry: Judgement and insight appear normal. Mood & affect appropriate.     Data Reviewed: I have personally reviewed following labs  and imaging studies  CBC: Recent Labs  Lab 11/25/20 1830 11/26/20 0927  WBC 20.0* 22.8*  HGB 13.5 12.7*  HCT 39.9 37.4*  MCV 93.9 92.6  PLT 287 086   Basic Metabolic Panel: Recent Labs  Lab 11/25/20 1830 11/26/20 0927  NA 130* 131*  K 4.4 4.3  CL 93* 95*  CO2 26 23  GLUCOSE 121* 111*  BUN 7 10  CREATININE 0.52* 0.59*  CALCIUM 8.8* 8.7*   GFR: Estimated Creatinine Clearance: 98.9 mL/min (A) (by C-G formula based on SCr of 0.59 mg/dL (L)). Liver Function Tests: No results for input(s): AST, ALT, ALKPHOS, BILITOT, PROT, ALBUMIN in the last 168 hours. No results for input(s): LIPASE, AMYLASE in the last 168 hours. No results for input(s): AMMONIA in the last 168 hours. Coagulation Profile: Recent Labs  Lab 11/25/20 2016  INR 1.1   Cardiac Enzymes: No results for input(s): CKTOTAL, CKMB, CKMBINDEX, TROPONINI in the last 168 hours. BNP (last 3 results) No results for input(s): PROBNP in the last 8760 hours. HbA1C: No results for input(s): HGBA1C in the last 72 hours. CBG: No results for input(s): GLUCAP in the last 168 hours. Lipid Profile: No results for input(s): CHOL, HDL, LDLCALC, TRIG, CHOLHDL, LDLDIRECT in the last 72 hours. Thyroid Function Tests: No results for input(s): TSH, T4TOTAL, FREET4, T3FREE, THYROIDAB in the last 72 hours. Anemia Panel: No results for input(s): VITAMINB12, FOLATE, FERRITIN, TIBC, IRON, RETICCTPCT in the last 72 hours. Sepsis Labs: Recent Labs  Lab 11/25/20 2016  LATICACIDVEN 1.2    Recent Results (from the past 240 hour(s))  Fungus Culture With Stain     Status: None (Preliminary result)   Collection Time: 11/18/20  2:40 PM   Specimen: PATH Cytology Pleural fluid  Result Value Ref Range Status   Fungus Stain Final report  Final    Comment: (NOTE) Performed At: [facility] [place], [place] [id number] Rush Farmer MD [id number]    Fungus (Mycology) Culture PENDING  Incomplete   Fungal Source  PLEURAL  Final    Comment: Performed at [facility], [address] [place], [place] [address]  Acid Fast Smear (AFB)     Status: None   Collection Time: 11/18/20  2:40 PM   Specimen: PATH Cytology Pleural fluid  Result Value Ref Range Status   AFB Specimen Processing Concentration  Final   Acid Fast Smear Negative  Final    Comment: (NOTE) Performed At: [facility] [place], [place] [id number] Rush Farmer MD [id number]    Source (AFB) PLEURAL  Final    Comment: Performed at [facility], [address] [place], [place] [address]  Body fluid culture w Gram Stain     Status: None   Collection Time: 11/18/20  2:40 PM   Specimen: PATH Cytology Pleural fluid  Result Value Ref Range Status   Specimen Description   Final    PLEURAL Performed at [facility], [address],  [place], [place] [address]    Special Requests   Final    NONE Performed at [facility], [address] [place], [place] [address]    Gram Stain   Final    MODERATE WBC PRESENT, PREDOMINANTLY MONONUCLEAR NO ORGANISMS SEEN    Culture   Final    NO GROWTH 3 DAYS Performed at [facility] [address] [place], [place] [address]    Report Status 11/22/2020 FINAL  Final  Fungus Culture Result     Status: None   Collection Time: 11/18/20  2:40 PM  Result Value Ref Range Status   Result 1 Comment  Final    Comment: (NOTE) KOH/Calcofluor preparation:  no fungus observed. Performed At: [facility] [place], [place] [id number] Rush Farmer MD [id number]   Resp Panel by RT-PCR (Flu A&B, Covid) Nasopharyngeal Swab     Status: None   Collection Time: 11/25/20  7:54 PM   Specimen: Nasopharyngeal Swab; Nasopharyngeal(NP) swabs in vial transport medium  Result Value Ref Range Status   SARS Coronavirus 2 by RT PCR NEGATIVE NEGATIVE Final    Comment: (NOTE) SARS-CoV-2 target nucleic acids are NOT  DETECTED.  The SARS-CoV-2 RNA is generally detectable in upper respiratory specimens during the acute phase of infection. The lowest concentration of SARS-CoV-2 viral copies this assay can detect is 138 copies/mL. A negative result does not preclude SARS-Cov-2 infection and should not be used as the sole basis for treatment or other patient management decisions. A negative result may occur with  improper specimen collection/handling, submission of specimen other than nasopharyngeal swab, presence of viral mutation(s) within the areas targeted by this assay, and inadequate number of viral copies(<138 copies/mL). A negative result must be combined with clinical observations, patient history, and epidemiological information. The expected result is Negative.  Fact Sheet for Patients:  EntrepreneurPulse.com.au  Fact Sheet for Healthcare Providers:  IncredibleEmployment.be  This test is no t yet approved or cleared by the [place] FDA and  has been authorized for detection and/or diagnosis of SARS-CoV-2 by FDA under an Emergency Use Authorization (EUA). This EUA will remain  in effect (meaning this test can be used) for the duration of the COVID-19 declaration under Section 564(b)(1) of the Act, 21 U.S.C.section 360bbb-3(b)(1), unless the authorization is terminated  or revoked sooner.       Influenza A by PCR NEGATIVE NEGATIVE Final   Influenza B by PCR NEGATIVE NEGATIVE Final    Comment: (NOTE) The Xpert Xpress SARS-CoV-2/FLU/RSV plus assay is intended as an aid in the diagnosis of influenza from Nasopharyngeal swab specimens and should not be used as a sole basis for treatment. Nasal washings and aspirates are unacceptable for Xpert Xpress SARS-CoV-2/FLU/RSV testing.  Fact Sheet for Patients: EntrepreneurPulse.com.au  Fact Sheet for Healthcare Providers: IncredibleEmployment.be  This test is not yet  approved or cleared by the [place] FDA and has been authorized for detection and/or diagnosis of SARS-CoV-2 by FDA under an Emergency Use Authorization (EUA). This EUA will remain in effect (meaning this test can be used) for the duration of the COVID-19 declaration under Section 564(b)(1) of the Act, 21 U.S.C. section 360bbb-3(b)(1), unless the authorization is terminated or revoked.  Performed at [facility], [address] [place], [place] [address]   Blood Culture (routine x 2)     Status: None (Preliminary result)   Collection Time: 11/25/20  8:17 PM   Specimen: BLOOD  Result Value Ref Range Status  Specimen Description BLOOD  LAC  Final   Special Requests   Final    BOTTLES DRAWN AEROBIC AND ANAEROBIC Blood Culture adequate volume   Culture   Final    NO GROWTH < 12 HOURS Performed at [facility], [address] [place], [place] [address]    Report Status PENDING  Incomplete  Blood Culture (routine x 2)     Status: None (Preliminary result)   Collection Time: 11/25/20  8:17 PM   Specimen: BLOOD  Result Value Ref Range Status   Specimen Description BLOOD  RIGHT HAND  Final   Special Requests   Final    BOTTLES DRAWN AEROBIC AND ANAEROBIC Blood Culture results may not be optimal due to an inadequate volume of blood received in culture bottles   Culture   Final    NO GROWTH < 12 HOURS Performed at [facility], [address] [place], [place] [address]    Report Status PENDING  Incomplete         Radiology Studies: DG Chest 2 View  Result Date: 11/25/2020 CLINICAL DATA:  Chest pain and shortness of breath EXAM: CHEST - 2 VIEW COMPARISON:  Chest radiograph 11/18/2020, chest CT 11/11/2020 FINDINGS: Unchanged cardiomediastinal silhouette. There is increased opacification of the left upper lung, with unchanged appearance of the left perihilar and left lower lobe mass. Small left pleural effusion. No visible pneumothorax. IMPRESSION:  Increased opacification of the left upper lung with known left perihilar and left lower lobe masses, consistent with worsening postobstructive pneumonia. Small left pleural effusion. Electronically Signed   By: Maurine Simmering   On: 11/25/2020 19:04   CT Angio Chest PE W and/or Wo Contrast  Result Date: 11/25/2020 CLINICAL DATA:  History of pneumonia left-sided chest pain EXAM: CT ANGIOGRAPHY CHEST WITH CONTRAST TECHNIQUE: Multidetector CT imaging of the chest was performed using the standard protocol during bolus administration of intravenous contrast. Multiplanar CT image reconstructions and MIPs were obtained to evaluate the vascular anatomy. CONTRAST:  69mL OMNIPAQUE IOHEXOL 350 MG/ML SOLN COMPARISON:  Chest x-ray 11/25/2020, CT chest 11/11/2020 FINDINGS: Cardiovascular: Satisfactory opacification of the pulmonary arteries to the segmental level. No evidence of pulmonary embolism. Encasement and diffuse narrowing of left-sided pulmonary vasculature secondary to lung mass. Normal cardiac size. Trace pericardial effusion. Mediastinum/Nodes: No thyroid mass. Mild narrowing of left mainstem bronchus with occlusion of left upper and lower lobe bronchi by tumor. Redemonstrated bulky mediastinal and left hilar adenopathy. Index prevascular node measuring 2.1 cm, 2.2 cm previously. Conglomerate subcarinal and left hilar adenopathy versus tumor infiltration. Enlarged left cardio phrenic node up to 8 mm, previously 7 mm. Esophageal displacement to the right by mediastinal adenopathy/mass, esophagus cannot be clearly separated from bulky mediastinal mass. Distal esophageal nodes measuring up to 12 mm. Lungs/Pleura: Moderate partially loculated left pleural effusion as before. Large poorly defined medial left upper lobe and left hilar lung mass with mediastinal invasion and or poorly defined adenopathy. Large presumed metastatic mass mostly within the left lower lobe, measuring 8.7 by 5.8 cm, previously 7.5 x 4.1 cm.  Subpleural anterior left lower lobe lung mass measures 2.9 x 1.8 cm, previously 2.6 x 1.2 cm, series 6, image 78. Extensive ground-glass density and patchy consolidation in the left upper lobe consistent with postobstructive pneumonia. Mild nodularity along the major fissure. Slightly worsened ground-glass density in the left lower lobe and surrounding the dominant left lower lobe lung mass. New or increased irregular nodularity in the left lateral lung base, series 6, image  77. Upper Abdomen: No acute abnormality. Subcentimeter low-density lesions in the liver too small to further characterize. Musculoskeletal: No acute osseous abnormality Review of the MIP images confirms the above findings. IMPRESSION: 1. Negative for acute pulmonary embolus. Diffuse narrowing and encasement of left-sided pulmonary vasculature by large lung mass. 2. Large poorly defined left upper lobe and hilar lung mass with bulky mediastinal and left hilar metastatic disease. Possible mediastinal invasion by large left upper lobe lung mass. Occlusion of left upper and lower lobe bronchi by tumor. Metastatic lesions in the left lower lobe measuring up to 8.7 cm, increased compared to prior CT. Slight worsening of diffuse ground-glass density in the left lower lobe, likely worsening postobstructive pneumonia. Continued extensive consolidation and ground-glass densities in the left upper lobe, also consistent with postobstructive pneumonia. Redemonstrated fissural nodularity in the left upper lobe raising concern for metastatic disease. Moderate partially loculated left-sided pleural effusion. Electronically Signed   By: Donavan Foil M.D.   On: 11/25/2020 21:04        Scheduled Meds: . dexamethasone  4 mg Oral BID  . dextromethorphan-guaiFENesin  1 tablet Oral BID  . enoxaparin (LOVENOX) injection  40 mg Subcutaneous Q24H  . metoprolol tartrate  2.5 mg Intravenous Q6H  . multivitamin with minerals  1 tablet Oral Daily  .  pantoprazole  40 mg Oral Daily   Continuous Infusions: . ampicillin-sulbactam (UNASYN) IV 3 g (11/26/20 1649)  . azithromycin       LOS: 1 day    Time spent: 44mins    Kathie Dike, MD [facility]   If 7PM-7AM, please contact night-coverage [email or web address]  11/26/2020, 6:00 PM

## 2020-11-26 NOTE — ED Notes (Signed)
Attending MD, Dr Roderic Palau, messaged per pt's request. Patient states prn pain medications are not fully relieving pain and not providing pain relief for the entire four hour time frame. Awaiting new orders.

## 2020-11-26 NOTE — ED Notes (Signed)
Messaged RN bed assigned

## 2020-11-26 NOTE — Consult Note (Signed)
[facility] CONSULT NOTE  Patient Care Team: System, Provider Not In as PCP - General  CHIEF COMPLAINTS/PURPOSE OF CONSULTATION: Lung mass  HISTORY OF PRESENTING ILLNESS:  Curtis Manning. 50 y.o.  male with no prior history of malignancy is currently admitted to hospital for worsening shortness of breath/left chest wall pain cough.  CT scan patient noted to have large left upper lobe/left hilar mass with bulky mediastinal and hilar adenopathy.  Metastatic lesions noted in the left lower lobe measuring up to 8 cm.  Also noted to have left lower/upper lobe postobstructive pneumonia.  Of note patient was recently admitted to hospital approximately 2 weeks ago for similar complaints with similar findings noted on imaging.  Patient was evaluated by pulmonary, Drs.Aleskerov/Fleming. Patient had thoracentesis done-cytology negative.  Patient was recommended outpatient oncology follow-up.  However because of worsening symptoms patient was readmitted to the hospital yesterday.  Patient states his left chest wall pain is controlled on pain medication.  However continues to be tachycardic 120s to 150s.  Hello Review of Systems  Constitutional: Positive for malaise/fatigue and weight loss. Negative for chills, diaphoresis and fever.  HENT: Negative for nosebleeds and sore throat.   Eyes: Negative for double vision.  Respiratory: Positive for cough and shortness of breath. Negative for wheezing.   Cardiovascular: Positive for chest pain. Negative for palpitations, orthopnea and leg swelling.  Gastrointestinal: Negative for abdominal pain, blood in stool, constipation, diarrhea, heartburn, melena, nausea and vomiting.  Genitourinary: Negative for dysuria, frequency and urgency.  Musculoskeletal: Negative for back pain and joint pain.  Skin: Negative.  Negative for itching and rash.  Neurological: Negative for dizziness, tingling, focal weakness, weakness and headaches.   Endo/Heme/Allergies: Does not bruise/bleed easily.  Psychiatric/Behavioral: Negative for depression. The patient is not nervous/anxious and does not have insomnia.      MEDICAL HISTORY: Past Medical History:  Diagnosis Date  . Asthma     SURGICAL HISTORY: History reviewed. No pertinent surgical history.  SOCIAL HISTORY: Social History   Socioeconomic History  . Marital status: Married    Spouse name: Not on file  . Number of children: Not on file  . Years of education: Not on file  . Highest education level: Not on file  Occupational History  . Not on file  Tobacco Use  . Smoking status: Current Every Day Smoker  . Smokeless tobacco: Never Used  Substance and Sexual Activity  . Alcohol use: Not on file  . Drug use: Not on file  . Sexual activity: Not on file  Other Topics Concern  . Not on file  Social History Narrative   Patient is a active smoker.;  Also liquor.  Lives with his wife in [place].  No children.  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 High blood pressure Mother     ALLERGIES:  has No Known Allergies.  MEDICATIONS:  Current Facility-Administered Medications  Medication Dose Route Frequency Provider Last Rate Last Admin  . Ampicillin-Sulbactam (UNASYN) 3 g in sodium chloride 0.9 % 100 mL IVPB  3 g Intravenous Q6H Kathie Dike, MD 200 mL/hr at 11/26/20 1649 3 g  at 11/26/20 1649  . azithromycin (ZITHROMAX) 500 mg in sodium chloride 0.9 % 250 mL IVPB  500 mg Intravenous Q24H Clarnce Flock, MD 250 mL/hr at 11/26/20 1948 500 mg at 11/26/20 1948  . dexamethasone (DECADRON) tablet 4 mg  4 mg Oral BID Kathie Dike, MD   4 mg at  11/26/20 1235  . dextromethorphan-guaiFENesin (MUCINEX DM) 30-600 MG per 12 hr tablet 1 tablet  1 tablet Oral BID Lovey Newcomer T, NP   1 tablet at 11/26/20 1049  . enoxaparin (LOVENOX) injection 40 mg  40 mg Subcutaneous Q24H Clarnce Flock, MD      . ipratropium (ATROVENT) nebulizer solution 0.5 mg  0.5 mg Nebulization Q6H PRN Clarnce Flock, MD   0.5 mg at 11/26/20 2011  . levalbuterol (XOPENEX) nebulizer solution 0.63 mg  0.63 mg Nebulization Q6H PRN Kathie Dike, MD   0.63 mg at 11/26/20 2012  . metoprolol tartrate (LOPRESSOR) injection 2.5 mg  2.5 mg Intravenous Q6H Kathie Dike, MD   2.5 mg at 11/26/20 1644  . morphine 4 MG/ML injection 4 mg  4 mg Intravenous Q4H PRN Clarnce Flock, MD   4 mg at 11/26/20 0725  . multivitamin with minerals tablet 1 tablet  1 tablet Oral Daily Clarnce Flock, MD   1 tablet at 11/26/20 1049  . ondansetron (ZOFRAN) tablet 4 mg  4 mg Oral Q6H PRN Clarnce Flock, MD       Or  . ondansetron [facility]) injection 4 mg  4 mg Intravenous Q6H PRN Clarnce Flock, MD   4 mg at 11/26/20 0725  . oxyCODONE-acetaminophen (PERCOCET/ROXICET) 5-325 MG per tablet 1-2 tablet  1-2 tablet Oral Q4H PRN Kathie Dike, MD   2 tablet at 11/26/20 1942  . pantoprazole (PROTONIX) EC tablet 40 mg  40 mg Oral Daily Clarnce Flock, MD   40 mg at 11/26/20 1049  . polyethylene glycol (MIRALAX / GLYCOLAX) packet 17 g  17 g Oral Daily PRN Clarnce Flock, MD      . traZODone (DESYREL) tablet 50 mg  50 mg Oral QHS PRN Clarnce Flock, MD          .  PHYSICAL EXAMINATION:  Vitals:   11/26/20 1957 11/26/20 2016  BP: 123/80   Pulse: (!) 125 (!) 128  Resp: 18 20  Temp: 98.6 F (37 C)   SpO2: 96% 96%   Filed Weights   11/25/20 1829 11/26/20 1450  Weight: 63.5 kg 63.3 kg    Physical Exam Constitutional:      Comments: Sitting in the bed accompanied by his wife.  HENT:     Head: Normocephalic and atraumatic.     Mouth/Throat:     Pharynx: No  oropharyngeal exudate.  Eyes:     Pupils: Pupils are equal, round, and reactive to light.  Cardiovascular:     Rate and Rhythm: Regular rhythm. Tachycardia present.  Pulmonary:     Effort: No respiratory distress.     Breath sounds: No wheezing.     Comments: Decreased air entry left more than right. Abdominal:     General: Bowel sounds are normal. There is no distension.     Palpations: Abdomen is soft. There is no mass.     Tenderness: There is no abdominal tenderness. There is no guarding or rebound.  Musculoskeletal:        General: No tenderness. Normal range of motion.     Cervical back: Normal range of motion  and neck supple.  Skin:    General: Skin is warm.  Neurological:     Mental Status: He is alert and oriented to person, place, and time.  Psychiatric:        Mood and Affect: Affect normal.      LABORATORY DATA:  I have reviewed the data as listed Lab Results  Component Value Date   WBC 22.8 (H) 11/26/2020   HGB 12.7 (L) 11/26/2020   HCT 37.4 (L) 11/26/2020   MCV 92.6 11/26/2020   PLT 264 11/26/2020   Recent Labs    11/11/20 1702 11/12/20 0439 11/14/20 0534 11/25/20 1830 11/26/20 0927  NA 133*   < > 139 130* 131*  K 4.3   < > 4.1 4.4 4.3  CL 99   < > 104 93* 95*  CO2 23   < > 27 26 23   GLUCOSE 124*   < > 90 121* 111*  BUN 9   < > 8 7 10   CREATININE 0.44*   < > 0.54* 0.52* 0.59*  CALCIUM 9.0   < > 8.8* 8.8* 8.7*  GFRNONAA >60   < > >60 >60 >60  PROT 6.9  --   --   --   --   ALBUMIN 2.9*  --   --   --   --   AST 25  --   --   --   --   ALT 13  --   --   --   --   ALKPHOS 76  --   --   --   --   BILITOT 0.5  --   --   --   --    < > = values in this interval not displayed.    RADIOGRAPHIC STUDIES: I have personally reviewed the radiological images as listed and agreed with the findings in the report. DG Chest 2 View  Result Date: 11/25/2020 CLINICAL DATA:  Chest pain and shortness of breath EXAM: CHEST - 2 VIEW COMPARISON:  Chest radiograph  11/18/2020, chest CT 11/11/2020 FINDINGS: Unchanged cardiomediastinal silhouette. There is increased opacification of the left upper lung, with unchanged appearance of the left perihilar and left lower lobe mass. Small left pleural effusion. No visible pneumothorax. IMPRESSION: Increased opacification of the left upper lung with known left perihilar and left lower lobe masses, consistent with worsening postobstructive pneumonia. Small left pleural effusion. Electronically Signed   By: Maurine Simmering   On: 11/25/2020 19:04   DG Chest 2 View  Result Date: 11/11/2020 CLINICAL DATA:  Shortness of breath EXAM: CHEST - 2 VIEW COMPARISON:  None. FINDINGS: There is an area that appears masslike in the posterior segment of the left lower lobe measuring 6.8 x 6.5 x 6.4 cm. There is extensive airspace opacity throughout the left upper lobe as well. A small area of infiltrate is noted more inferiorly on the left. Right lung is clear. Heart size and pulmonary vascularity normal. Questionable adenopathy left hilum. No bone lesions. IMPRESSION: Suspected neoplastic focus in the superior segment left lower lobe measuring 6.8 x 6.5 x 6.4 cm. Apparent pneumonia throughout the left upper lobe. Ill-defined opacity left lower lobe may represent focal pneumonia, although on the frontal view, this area appears masslike and could represent a second potential neoplasm. This area on frontal view measures 3.7 x 3.1 cm. Advise chest CT for further assessment. Right lung clear. Heart size normal. Suspected left hilar adenopathy. Electronically Signed   By: Lowella Grip III M.D.  On: 11/11/2020 17:41   CT Angio Chest PE W and/or Wo Contrast  Result Date: 11/25/2020 CLINICAL DATA:  History of pneumonia left-sided chest pain EXAM: CT ANGIOGRAPHY CHEST WITH CONTRAST TECHNIQUE: Multidetector CT imaging of the chest was performed using the standard protocol during bolus administration of intravenous contrast. Multiplanar CT image  reconstructions and MIPs were obtained to evaluate the vascular anatomy. CONTRAST:  87mL OMNIPAQUE IOHEXOL 350 MG/ML SOLN COMPARISON:  Chest x-ray 11/25/2020, CT chest 11/11/2020 FINDINGS: Cardiovascular: Satisfactory opacification of the pulmonary arteries to the segmental level. No evidence of pulmonary embolism. Encasement and diffuse narrowing of left-sided pulmonary vasculature secondary to lung mass. Normal cardiac size. Trace pericardial effusion. Mediastinum/Nodes: No thyroid mass. Mild narrowing of left mainstem bronchus with occlusion of left upper and lower lobe bronchi by tumor. Redemonstrated bulky mediastinal and left hilar adenopathy. Index prevascular node measuring 2.1 cm, 2.2 cm previously. Conglomerate subcarinal and left hilar adenopathy versus tumor infiltration. Enlarged left cardio phrenic node up to 8 mm, previously 7 mm. Esophageal displacement to the right by mediastinal adenopathy/mass, esophagus cannot be clearly separated from bulky mediastinal mass. Distal esophageal nodes measuring up to 12 mm. Lungs/Pleura: Moderate partially loculated left pleural effusion as before. Large poorly defined medial left upper lobe and left hilar lung mass with mediastinal invasion and or poorly defined adenopathy. Large presumed metastatic mass mostly within the left lower lobe, measuring 8.7 by 5.8 cm, previously 7.5 x 4.1 cm. Subpleural anterior left lower lobe lung mass measures 2.9 x 1.8 cm, previously 2.6 x 1.2 cm, series 6, image 78. Extensive ground-glass density and patchy consolidation in the left upper lobe consistent with postobstructive pneumonia. Mild nodularity along the major fissure. Slightly worsened ground-glass density in the left lower lobe and surrounding the dominant left lower lobe lung mass. New or increased irregular nodularity in the left lateral lung base, series 6, image 77. Upper Abdomen: No acute abnormality. Subcentimeter low-density lesions in the liver too small to  further characterize. Musculoskeletal: No acute osseous abnormality Review of the MIP images confirms the above findings. IMPRESSION: 1. Negative for acute pulmonary embolus. Diffuse narrowing and encasement of left-sided pulmonary vasculature by large lung mass. 2. Large poorly defined left upper lobe and hilar lung mass with bulky mediastinal and left hilar metastatic disease. Possible mediastinal invasion by large left upper lobe lung mass. Occlusion of left upper and lower lobe bronchi by tumor. Metastatic lesions in the left lower lobe measuring up to 8.7 cm, increased compared to prior CT. Slight worsening of diffuse ground-glass density in the left lower lobe, likely worsening postobstructive pneumonia. Continued extensive consolidation and ground-glass densities in the left upper lobe, also consistent with postobstructive pneumonia. Redemonstrated fissural nodularity in the left upper lobe raising concern for metastatic disease. Moderate partially loculated left-sided pleural effusion. Electronically Signed   By: Donavan Foil M.D.   On: 11/25/2020 21:04   CT Angio Chest PE W and/or Wo Contrast  Result Date: 11/11/2020 CLINICAL DATA:  Lung mass. EXAM: CT ANGIOGRAPHY CHEST CT ABDOMEN AND PELVIS WITH CONTRAST TECHNIQUE: Multidetector CT imaging of the chest was performed using the standard protocol during bolus administration of intravenous contrast. Multiplanar CT image reconstructions and MIPs were obtained to evaluate the vascular anatomy. Multidetector CT imaging of the abdomen and pelvis was performed using the standard protocol during bolus administration of intravenous contrast. CONTRAST:  35mL OMNIPAQUE IOHEXOL 350 MG/ML SOLN COMPARISON:  Chest x-ray from same day. FINDINGS: CTA CHEST FINDINGS Cardiovascular: Satisfactory opacification of the pulmonary arteries  to the segmental level. No evidence of pulmonary embolism. Narrowing of the left main, lobar, and segmental pulmonary arteries due to  tumor. Normal heart size. No pericardial effusion. Mediastinum/Nodes: Bulky mediastinal left hilar lymphadenopathy. Index prevascular lymph node measures 2.2 cm in short axis. Conglomerate subcarinal and left hilar lymphadenopathy and/or tumor, difficult to measure. Lower paraesophageal lymph node measuring 9 mm in short axis (series 4, image 81). Prominent left cardiophrenic angle lymph node measuring 7 mm in short axis (series 4, image 96). No pathologically enlarged right hilar or axillary lymph nodes. The thyroid gland, trachea, and esophagus demonstrate no significant findings. Lungs/Pleura: Large left perihilar and medial upper lobe mass, difficult to measure, with near complete occlusion of the central left upper and lower lobe bronchi. 7.5 x 4.1 cm mass predominantly within the superior segment of the left lower lobe, but crossing the major fissure. 1.2 x 2.6 cm subpleural nodule in the anterior left lower lobe (series 4, image 73). Patchy consolidation in the left upper lobe due to post obstructive pneumonia. Nodular interlobular septal thickening in the left upper lobe. Nodularity along the left major fissure superiorly. Small to moderate partially loculated left pleural effusion. The right lung is clear. No pneumothorax. Musculoskeletal: No chest wall abnormality. No acute or significant osseous findings. Review of the MIP images confirms the above findings. CT ABDOMEN AND PELVIS FINDINGS Hepatobiliary: Few scattered subcentimeter low-density lesions within the liver, too small to characterize. The gallbladder is unremarkable. No biliary dilatation. Pancreas: Unremarkable. No pancreatic ductal dilatation or surrounding inflammatory changes. Spleen: Normal in size without focal abnormality. Adrenals/Urinary Tract: Adrenal glands are unremarkable. 1.3 cm left renal simple cyst. No renal calculi or hydronephrosis. The bladder is under distended. Stomach/Bowel: Stomach is within normal limits. Appendix  appears normal. No evidence of bowel wall thickening, distention, or inflammatory changes. Vascular/Lymphatic: No significant vascular findings are present. No enlarged abdominal or pelvic lymph nodes. Reproductive: Prostate is unremarkable. Other: Small fat containing left inguinal hernia. No free fluid or pneumoperitoneum. Musculoskeletal: No acute or significant osseous findings. Review of the MIP images confirms the above findings. IMPRESSION: CT chest: 1. Large left perihilar and medial upper lobe mass with mediastinal invasion, consistent with primary lung cancer. Metastases in the left lower lobe measuring up to 7.5 cm. 2. Left upper lobe lymphangitic carcinomatosis. Bulky mediastinal and left hilar nodal metastases. 3. Near complete occlusion of the central left upper and lower lobe bronchi. Left upper lobe postobstructive pneumonia. 4. No evidence of pulmonary embolism. Narrowing of the left pulmonary arteries due to tumor. 5. 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 CT abdomen pelvis: 1. No evidence of metastatic disease in the abdomen or pelvis. Electronically Signed   By: Titus Dubin M.D.   On: 11/11/2020 21:37   CT ABDOMEN PELVIS W CONTRAST  Result Date: 11/11/2020 CLINICAL DATA:  Lung mass. EXAM: CT ANGIOGRAPHY CHEST CT ABDOMEN AND PELVIS WITH CONTRAST TECHNIQUE: Multidetector CT imaging of the chest was performed using the standard protocol during bolus administration of intravenous contrast. Multiplanar CT image reconstructions and MIPs were obtained to evaluate the vascular anatomy. Multidetector CT imaging of the abdomen and pelvis was performed using the standard protocol during bolus administration of intravenous contrast. CONTRAST:  57mL OMNIPAQUE IOHEXOL 350 MG/ML SOLN COMPARISON:  Chest x-ray from same day. FINDINGS: CTA CHEST FINDINGS Cardiovascular: Satisfactory opacification of the pulmonary arteries to the segmental level. No evidence of pulmonary  embolism. Narrowing of the left main, lobar, and segmental pulmonary arteries due to  tumor. Normal heart size. No pericardial effusion. Mediastinum/Nodes: Bulky mediastinal left hilar lymphadenopathy. Index prevascular lymph node measures 2.2 cm in short axis. Conglomerate subcarinal and left hilar lymphadenopathy and/or tumor, difficult to measure. Lower paraesophageal lymph node measuring 9 mm in short axis (series 4, image 81). Prominent left cardiophrenic angle lymph node measuring 7 mm in short axis (series 4, image 96). No pathologically enlarged right hilar or axillary lymph nodes. The thyroid gland, trachea, and esophagus demonstrate no significant findings. Lungs/Pleura: Large left perihilar and medial upper lobe mass, difficult to measure, with near complete occlusion of the central left upper and lower lobe bronchi. 7.5 x 4.1 cm mass predominantly within the superior segment of the left lower lobe, but crossing the major fissure. 1.2 x 2.6 cm subpleural nodule in the anterior left lower lobe (series 4, image 73). Patchy consolidation in the left upper lobe due to post obstructive pneumonia. Nodular interlobular septal thickening in the left upper lobe. Nodularity along the left major fissure superiorly. Small to moderate partially loculated left pleural effusion. The right lung is clear. No pneumothorax. Musculoskeletal: No chest wall abnormality. No acute or significant osseous findings. Review of the MIP images confirms the above findings. CT ABDOMEN AND PELVIS FINDINGS Hepatobiliary: Few scattered subcentimeter low-density lesions within the liver, too small to characterize. The gallbladder is unremarkable. No biliary dilatation. Pancreas: Unremarkable. No pancreatic ductal dilatation or surrounding inflammatory changes. Spleen: Normal in size without focal abnormality. Adrenals/Urinary Tract: Adrenal glands are unremarkable. 1.3 cm left renal simple cyst. No renal calculi or hydronephrosis. The  bladder is under distended. Stomach/Bowel: Stomach is within normal limits. Appendix appears normal. No evidence of bowel wall thickening, distention, or inflammatory changes. Vascular/Lymphatic: No significant vascular findings are present. No enlarged abdominal or pelvic lymph nodes. Reproductive: Prostate is unremarkable. Other: Small fat containing left inguinal hernia. No free fluid or pneumoperitoneum. Musculoskeletal: No acute or significant osseous findings. Review of the MIP images confirms the above findings. IMPRESSION: CT chest: 1. Large left perihilar and medial upper lobe mass with mediastinal invasion, consistent with primary lung cancer. Metastases in the left lower lobe measuring up to 7.5 cm. 2. Left upper lobe lymphangitic carcinomatosis. Bulky mediastinal and left hilar nodal metastases. 3. Near complete occlusion of the central left upper and lower lobe bronchi. Left upper lobe postobstructive pneumonia. 4. No evidence of pulmonary embolism. Narrowing of the left pulmonary arteries due to tumor. 5. 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 CT abdomen pelvis: 1. No evidence of metastatic disease in the abdomen or pelvis. Electronically Signed   By: Titus Dubin M.D.   On: 11/11/2020 21:37   DG Chest Port 1 View  Result Date: 11/18/2020 CLINICAL DATA:  50 year old male with history of left lung mass and left pleural effusion. EXAM: PORTABLE CHEST - 1 VIEW COMPARISON:  11/11/2020 FINDINGS: The mediastinal contours partially obscured at the AP window secondary to pulmonary mass. No evidence of cardiomegaly. Resolution of previously visualized small left pleural effusion. No evidence of pneumothorax. Similar appearing left mid lung solid mass measuring up to approximately 7.5 cm. 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 The right lung is clear. No acute  osseous abnormality. IMPRESSION: 1. Resolution of left pleural effusion after left thoracentesis. No evidence of pneumothorax. 2. Similar appearing solid mass in the superior segment left lower lobe, left hilar lymphadenopathy, and left upper lobe consolidative opacities. Ruthann Cancer, MD [facility] Electronically Signed   By: Ruthann Cancer MD   On: 11/18/2020 15:20   US THORACENTESIS ASP PLEURAL SPACE W/IMG GUIDE  Result Date: 11/18/2020 INDICATION: Patient with history of respiratory distress found to have a lung mass with a left-sided pleural effusion. Request is for therapeutic and diagnostic thoracentesis EXAM: ULTRASOUND GUIDED LEFT-SIDED THERAPEUTIC AND DIAGNOSTIC THORACENTESIS MEDICATIONS: Lidocaine 1% 10 mL COMPLICATIONS: None immediate. PROCEDURE: An ultrasound guided thoracentesis was thoroughly discussed with the patient and questions answered. The benefits, risks, alternatives and complications were also discussed. The patient understands and wishes to proceed with the procedure. Written consent was obtained. Ultrasound was performed to localize and mark an adequate pocket of fluid in the left chest. The area was then prepped and draped in the normal sterile fashion. 1% Lidocaine was used for local anesthesia. Under ultrasound guidance a catheter was introduced. Thoracentesis was performed. The catheter was removed and a dressing applied. FINDINGS: A total of approximately 600 mL of dark amber fluid was removed. Samples were sent to the laboratory as requested by the clinical team. The pleural effusion was noted to be loculated. IMPRESSION: Successful ultrasound guided left-sided therapeutic and diagnostic thoracentesis yielding 600 mL of pleural fluid. Read by: Rushie Nyhan, NP Electronically Signed   By: Ruthann Cancer MD   On: 11/18/2020 15:20    Mass of upper lobe of left lung #50 year old male patient with history of smoking  is currently in the hospital for worsening left chest wall pain/shortness of breath; CT scan-left upper lobe lung mass/mediastinal adenopathy  # Left upper lobe mass/mediastinal adenopathy-highly concerning for lung malignancy.  Likely suggestive of stage III malignancy.  #Postobstructive pneumonia/left chest wall pain-secondary to malignancy-no evidence of PE antibiotics  #Sinus tachycardia-120-140s-likely physiologic/pain.   #Active smoker  Recommendations:   #Recommend CT-guided biopsy of the left lung mass.  #I had a long discussion with patient/wife-regarding the concerning findings of possible malignancy-which appears at least stage III given the mediastinal adenopathy.  Patient and wife understand that he will need a PET scan for complete staging once the biopsy is done.   Thank you Dr. for allowing me to participate in the care of your pleasant patient. Please do not hesitate to contact me with questions or concerns in the interim.  Discussed with Dr. Roderic Palau.   # I reviewed the blood work- with the patient in detail; also reviewed the imaging independently [as summarized above]; and with the patient in detail.   All questions were answered. The patient knows to call the clinic with any problems, questions or concerns.   Cammie Sickle, MD 11/26/2020 8:37 PM

## 2020-11-26 NOTE — ED Notes (Signed)
Patient called RN to bedside.  SOB.  Administered breathing treatment per PRN.

## 2020-11-27 DIAGNOSIS — I4892 Unspecified atrial flutter: Secondary | ICD-10-CM

## 2020-11-27 MED ORDER — DILTIAZEM HCL 25 MG/5ML IV SOLN
10.0000 mg | Freq: Once | INTRAVENOUS | Status: AC
  Administered 2020-11-27: 10 mg via INTRAVENOUS
  Filled 2020-11-27: qty 5

## 2020-11-27 MED ORDER — METOPROLOL TARTRATE 25 MG PO TABS: 25.0000 mg | ORAL_TABLET | Freq: Two times a day (BID) | ORAL | Status: DC

## 2020-11-27 MED ORDER — SODIUM CHLORIDE 0.9 % IV SOLN
INTRAVENOUS | Status: DC | PRN
  Administered 2020-11-27: 500 mL via INTRAVENOUS

## 2020-11-27 MED ORDER — METOPROLOL TARTRATE 25 MG PO TABS
25.0000 mg | ORAL_TABLET | Freq: Two times a day (BID) | ORAL | Status: DC
  Administered 2020-11-27: 25 mg via ORAL
  Filled 2020-11-27: qty 1

## 2020-11-27 MED ORDER — METOPROLOL TARTRATE 25 MG PO TABS
25.0000 mg | ORAL_TABLET | Freq: Four times a day (QID) | ORAL | Status: DC
  Administered 2020-11-27 – 2020-11-29 (×6): 25 mg via ORAL
  Filled 2020-11-27 (×6): qty 1

## 2020-11-27 NOTE — Progress Notes (Signed)
PROGRESS NOTE    Curtis Manning.  UDJ:[id number] DOB: 1970-07-20 DOA: 11/25/2020 PCP: System, Provider Not In    Brief Narrative:  50 year old male with history of COPD and tobacco use, recent diagnosis of large left lung mass that was diagnosed approximately 2 weeks ago.  He returns to the hospital with significant pain and shortness of breath in his chest.  CT scan was repeated that did not show PE, but showed enlarging lung mass.  He was admitted for further work-up.  Oncology and pulmonology consulted.   Assessment & Plan:   Active Problems:   Obstructive pneumonia   Mass of upper lobe of left lung   Pleural effusion on left   GERD (gastroesophageal reflux disease)   COPD (chronic obstructive pulmonary disease) (HCC)   Sinus tachycardia   Left lung mass, suspicious for malignancy -This was initially noted on CT scan approximately 2 weeks ago -Repeat CT scan during this admission shows slight enlargement -Mass appears to be compressing left pulmonary vasculature as well as occluding the left lower lobe and upper lobe bronchi -Appreciate oncology input -Patient was reviewed by interventional radiology was not felt to be an appropriate candidate for CT guided biopsy -Recommended pulmonology consultation for possible bronchoscopy -Pulmonology consulted  Postobstructive pneumonia -Noted on CT -He does report some cough -Continue on Unasyn and azithromycin -He is also on Decadron  Left pleural effusion -Recent thoracentesis on 5/20 did not show any evidence of malignant cells -Possibly parapneumonic effusion -Fortunately, he does not appear to be short of breath and is on room air at this time  Sinus tachycardia -CT chest negative for PE -Suspect this is likely physiologic response to underlying pulmonary issues -will use low dose metoprolol as blood pressure tolerates  COPD -He is not wheezing at this time -Continue bronchodilators as needed  GERD -Continue on  PPI   DVT prophylaxis: enoxaparin (LOVENOX) injection 40 mg Start: 11/25/20 2215 SCDs Start: 11/25/20 2210  Code Status: full code Family Communication: discussed with patient Disposition Plan: Status is: Inpatient  Remains inpatient appropriate because:IV treatments appropriate due to intensity of illness or inability to take PO and Inpatient level of care appropriate due to severity of illness   Dispo: The patient is from: Home              Anticipated d/c is to: Home              Patient currently is not medically stable to d/c.   Difficult to place patient No   Consultants:   Oncology  Pulmonology  Procedures:     Antimicrobials:   unasyn 5/28>  Azithromycin 5/28>    Subjective: Feels that chest pain/back pain is better.  Continues to have some cough.  Objective: Vitals:   11/27/20 0613 11/27/20 0754 11/27/20 1058 11/27/20 1521  BP: 107/72 129/66 111/78 127/71  Pulse: (!) 105 (!) 102 (!) 106 (!) 114  Resp: 18 17 18 18   Temp: (!) 97.5 F (36.4 C) 97.9 F (36.6 C) 98.2 F (36.8 C) 97.8 F (36.6 C)  TempSrc:   Oral Oral  SpO2: 96% 94% 93% 97%  Weight:      Height:        Intake/Output Summary (Last 24 hours) at 11/27/2020 1607 Last data filed at 11/27/2020 0327 Gross per 24 hour  Intake 450 ml  Output --  Net 450 ml   Filed Weights   11/25/20 1829 11/26/20 1450  Weight: 63.5 kg 63.3 kg    Examination:  General exam: Appears calm and comfortable  Respiratory system: Clear to auscultation. Respiratory effort normal. Cardiovascular system: S1 & S2 heard, tachycardic. No JVD, murmurs, rubs, gallops or clicks. No pedal edema. Gastrointestinal system: Abdomen is nondistended, soft and nontender. No organomegaly or masses felt. Normal bowel sounds heard. Central nervous system: Alert and oriented. No focal neurological deficits. Extremities: Symmetric 5 x 5 power. Skin: No rashes, lesions or ulcers Psychiatry: Judgement and insight appear normal.  Mood & affect appropriate.     Data Reviewed: I have personally reviewed following labs and imaging studies  CBC: Recent Labs  Lab 11/25/20 1830 11/26/20 0927  WBC 20.0* 22.8*  HGB 13.5 12.7*  HCT 39.9 37.4*  MCV 93.9 92.6  PLT 287 160   Basic Metabolic Panel: Recent Labs  Lab 11/25/20 1830 11/26/20 0927  NA 130* 131*  K 4.4 4.3  CL 93* 95*  CO2 26 23  GLUCOSE 121* 111*  BUN 7 10  CREATININE 0.52* 0.59*  CALCIUM 8.8* 8.7*   GFR: Estimated Creatinine Clearance: 98.9 mL/min (A) (by C-G formula based on SCr of 0.59 mg/dL (L)). Liver Function Tests: No results for input(s): AST, ALT, ALKPHOS, BILITOT, PROT, ALBUMIN in the last 168 hours. No results for input(s): LIPASE, AMYLASE in the last 168 hours. No results for input(s): AMMONIA in the last 168 hours. Coagulation Profile: Recent Labs  Lab 11/25/20 2016  INR 1.1   Cardiac Enzymes: No results for input(s): CKTOTAL, CKMB, CKMBINDEX, TROPONINI in the last 168 hours. BNP (last 3 results) No results for input(s): PROBNP in the last 8760 hours. HbA1C: No results for input(s): HGBA1C in the last 72 hours. CBG: No results for input(s): GLUCAP in the last 168 hours. Lipid Profile: No results for input(s): CHOL, HDL, LDLCALC, TRIG, CHOLHDL, LDLDIRECT in the last 72 hours. Thyroid Function Tests: No results for input(s): TSH, T4TOTAL, FREET4, T3FREE, THYROIDAB in the last 72 hours. Anemia Panel: No results for input(s): VITAMINB12, FOLATE, FERRITIN, TIBC, IRON, RETICCTPCT in the last 72 hours. Sepsis Labs: Recent Labs  Lab 11/25/20 2016  LATICACIDVEN 1.2    Recent Results (from the past 240 hour(s))  Fungus Culture With Stain     Status: None (Preliminary result)   Collection Time: 11/18/20  2:40 PM   Specimen: PATH Cytology Pleural fluid  Result Value Ref Range Status   Fungus Stain Final report  Final    Comment: (NOTE) Performed At: [facility] [place], [place] [id number] Rush Farmer MD [id number]    Fungus (Mycology) Culture PENDING  Incomplete   Fungal Source PLEURAL  Final    Comment: Performed at [facility], [address] [place], [place] [address]  Acid Fast Smear (AFB)     Status: None   Collection Time: 11/18/20  2:40 PM   Specimen: PATH Cytology Pleural fluid  Result Value Ref Range Status   AFB Specimen Processing Concentration  Final   Acid Fast Smear Negative  Final    Comment: (NOTE) Performed At: [facility] [place], [place] [id number] Rush Farmer MD [id number]    Source (AFB) PLEURAL  Final    Comment: Performed at [facility], [address] [place], [place] [address]  Body fluid culture w Gram Stain     Status: None   Collection Time: 11/18/20  2:40 PM   Specimen: PATH Cytology Pleural fluid  Result Value Ref Range Status   Specimen Description   Final    PLEURAL Performed at  [facility], [address] [place], [place] [address]    Special Requests   Final    NONE Performed at [facility], [address] [place], [place] [address]    Gram Stain   Final    MODERATE WBC PRESENT, PREDOMINANTLY MONONUCLEAR NO ORGANISMS SEEN    Culture   Final    NO GROWTH 3 DAYS Performed at [facility], [address] [place], [place] [address]    Report Status 11/22/2020 FINAL  Final  Fungus Culture Result     Status: None   Collection Time: 11/18/20  2:40 PM  Result Value Ref Range Status   Result 1 Comment  Final    Comment: (NOTE) KOH/Calcofluor preparation:  no fungus observed. Performed At: [facility] [place], [place] [id number] Rush Farmer MD [id number]   Resp Panel by RT-PCR (Flu A&B, Covid) Nasopharyngeal Swab     Status: None   Collection Time: 11/25/20  7:54 PM   Specimen: Nasopharyngeal Swab; Nasopharyngeal(NP) swabs in vial transport medium  Result Value Ref Range Status   SARS Coronavirus 2 by RT PCR  NEGATIVE NEGATIVE Final    Comment: (NOTE) SARS-CoV-2 target nucleic acids are NOT DETECTED.  The SARS-CoV-2 RNA is generally detectable in upper respiratory specimens during the acute phase of infection. The lowest concentration of SARS-CoV-2 viral copies this assay can detect is 138 copies/mL. A negative result does not preclude SARS-Cov-2 infection and should not be used as the sole basis for treatment or other patient management decisions. A negative result may occur with  improper specimen collection/handling, submission of specimen other than nasopharyngeal swab, presence of viral mutation(s) within the areas targeted by this assay, and inadequate number of viral copies(<138 copies/mL). A negative result must be combined with clinical observations, patient history, and epidemiological information. The expected result is Negative.  Fact Sheet for Patients:  EntrepreneurPulse.com.au  Fact Sheet for Healthcare Providers:  IncredibleEmployment.be  This test is no t yet approved or cleared by the [place] FDA and  has been authorized for detection and/or diagnosis of SARS-CoV-2 by FDA under an Emergency Use Authorization (EUA). This EUA will remain  in effect (meaning this test can be used) for the duration of the COVID-19 declaration under Section 564(b)(1) of the Act, 21 U.S.C.section 360bbb-3(b)(1), unless the authorization is terminated  or revoked sooner.       Influenza A by PCR NEGATIVE NEGATIVE Final   Influenza B by PCR NEGATIVE NEGATIVE Final    Comment: (NOTE) The Xpert Xpress SARS-CoV-2/FLU/RSV plus assay is intended as an aid in the diagnosis of influenza from Nasopharyngeal swab specimens and should not be used as a sole basis for treatment. Nasal washings and aspirates are unacceptable for Xpert Xpress SARS-CoV-2/FLU/RSV testing.  Fact Sheet for Patients: EntrepreneurPulse.com.au  Fact Sheet for  Healthcare Providers: IncredibleEmployment.be  This test is not yet approved or cleared by the [place] FDA and has been authorized for detection and/or diagnosis of SARS-CoV-2 by FDA under an Emergency Use Authorization (EUA). This EUA will remain in effect (meaning this test can be used) for the duration of the COVID-19 declaration under Section 564(b)(1) of the Act, 21 U.S.C. section 360bbb-3(b)(1), unless the authorization is terminated or revoked.  Performed at [facility], [address] [place], [place] [address]   Blood Culture (routine x 2)     Status: None (Preliminary result)   Collection Time: 11/25/20  8:17 PM   Specimen: BLOOD  Result Value Ref Range Status   Specimen Description BLOOD  LAC  Final   Special Requests   Final    BOTTLES DRAWN AEROBIC AND ANAEROBIC Blood Culture adequate volume   Culture   Final    NO GROWTH 2 DAYS Performed at [facility], [address] [place], [place] [address]    Report Status PENDING  Incomplete  Blood Culture (routine x 2)     Status: None (Preliminary result)   Collection Time: 11/25/20  8:17 PM   Specimen: BLOOD  Result Value Ref Range Status   Specimen Description BLOOD  RIGHT HAND  Final   Special Requests   Final    BOTTLES DRAWN AEROBIC AND ANAEROBIC Blood Culture results may not be optimal due to an inadequate volume of blood received in culture bottles   Culture   Final    NO GROWTH 2 DAYS Performed at [facility], [address], [place], [place] [address]    Report Status PENDING  Incomplete         Radiology Studies: DG Chest 2 View  Result Date: 11/25/2020 CLINICAL DATA:  Chest pain and shortness of breath EXAM: CHEST - 2 VIEW COMPARISON:  Chest radiograph 11/18/2020, chest CT 11/11/2020 FINDINGS: Unchanged cardiomediastinal silhouette. There is increased opacification of the left upper lung, with unchanged appearance of the left perihilar and left  lower lobe mass. Small left pleural effusion. No visible pneumothorax. IMPRESSION: Increased opacification of the left upper lung with known left perihilar and left lower lobe masses, consistent with worsening postobstructive pneumonia. Small left pleural effusion. Electronically Signed   By: Maurine Simmering   On: 11/25/2020 19:04   CT Angio Chest PE W and/or Wo Contrast  Result Date: 11/25/2020 CLINICAL DATA:  History of pneumonia left-sided chest pain EXAM: CT ANGIOGRAPHY CHEST WITH CONTRAST TECHNIQUE: Multidetector CT imaging of the chest was performed using the standard protocol during bolus administration of intravenous contrast. Multiplanar CT image reconstructions and MIPs were obtained to evaluate the vascular anatomy. CONTRAST:  103mL OMNIPAQUE IOHEXOL 350 MG/ML SOLN COMPARISON:  Chest x-ray 11/25/2020, CT chest 11/11/2020 FINDINGS: Cardiovascular: Satisfactory opacification of the pulmonary arteries to the segmental level. No evidence of pulmonary embolism. Encasement and diffuse narrowing of left-sided pulmonary vasculature secondary to lung mass. Normal cardiac size. Trace pericardial effusion. Mediastinum/Nodes: No thyroid mass. Mild narrowing of left mainstem bronchus with occlusion of left upper and lower lobe bronchi by tumor. Redemonstrated bulky mediastinal and left hilar adenopathy. Index prevascular node measuring 2.1 cm, 2.2 cm previously. Conglomerate subcarinal and left hilar adenopathy versus tumor infiltration. Enlarged left cardio phrenic node up to 8 mm, previously 7 mm. Esophageal displacement to the right by mediastinal adenopathy/mass, esophagus cannot be clearly separated from bulky mediastinal mass. Distal esophageal nodes measuring up to 12 mm. Lungs/Pleura: Moderate partially loculated left pleural effusion as before. Large poorly defined medial left upper lobe and left hilar lung mass with mediastinal invasion and or poorly defined adenopathy. Large presumed metastatic mass mostly  within the left lower lobe, measuring 8.7 by 5.8 cm, previously 7.5 x 4.1 cm. Subpleural anterior left lower lobe lung mass measures 2.9 x 1.8 cm, previously 2.6 x 1.2 cm, series 6, image 78. Extensive ground-glass density and patchy consolidation in the left upper lobe consistent with postobstructive pneumonia. Mild nodularity along the major fissure. Slightly worsened ground-glass density in the left lower lobe and surrounding the dominant left lower lobe lung mass. New or increased irregular nodularity in the left lateral  lung base, series 6, image 77. Upper Abdomen: No acute abnormality. Subcentimeter low-density lesions in the liver too small to further characterize. Musculoskeletal: No acute osseous abnormality Review of the MIP images confirms the above findings. IMPRESSION: 1. Negative for acute pulmonary embolus. Diffuse narrowing and encasement of left-sided pulmonary vasculature by large lung mass. 2. Large poorly defined left upper lobe and hilar lung mass with bulky mediastinal and left hilar metastatic disease. Possible mediastinal invasion by large left upper lobe lung mass. Occlusion of left upper and lower lobe bronchi by tumor. Metastatic lesions in the left lower lobe measuring up to 8.7 cm, increased compared to prior CT. Slight worsening of diffuse ground-glass density in the left lower lobe, likely worsening postobstructive pneumonia. Continued extensive consolidation and ground-glass densities in the left upper lobe, also consistent with postobstructive pneumonia. Redemonstrated fissural nodularity in the left upper lobe raising concern for metastatic disease. Moderate partially loculated left-sided pleural effusion. Electronically Signed   By: Donavan Foil M.D.   On: 11/25/2020 21:04        Scheduled Meds: . dexamethasone  4 mg Oral BID  . dextromethorphan-guaiFENesin  1 tablet Oral BID  . enoxaparin (LOVENOX) injection  40 mg Subcutaneous Q24H  . metoprolol tartrate  25 mg Oral  BID  . multivitamin with minerals  1 tablet Oral Daily  . pantoprazole  40 mg Oral Daily   Continuous Infusions: . ampicillin-sulbactam (UNASYN) IV 3 g (11/27/20 1023)  . azithromycin 500 mg (11/26/20 1948)     LOS: 2 days    Time spent: 49mins    Kathie Dike, MD [facility]   If 7PM-7AM, please contact night-coverage [email or web address]  11/27/2020, 4:07 PM

## 2020-11-27 NOTE — Progress Notes (Signed)
ECG 139, ST. Patient asymptomatic, Dr. Roderic Palau notified, po metoprolol ordered.

## 2020-11-27 NOTE — Progress Notes (Signed)
50 y.o. male inpatient. History of COPD and tobacco uses, Recently found to have a large left lung mass. Presented to the ED at [facility] with pain and SOB.Team is requesting a LUL lung biopsy.  After review of procedure request by IR Attending Dr. Serafina Royals area in question is not amenable to percutaneous access. Recommend Team consult Pulmonology for possible bronchoscopy. This was communicated directly to the Team.

## 2020-11-27 NOTE — Plan of Care (Signed)
  Problem: Education: Goal: Knowledge of General Education information will improve Description: Including pain rating scale, medication(s)/side effects and non-pharmacologic comfort measures Outcome: Progressing   Problem: Pain Managment: Goal: General experience of comfort will improve Outcome: Progressing   Problem: Safety: Goal: Ability to remain free from injury will improve Outcome: Progressing   

## 2020-11-27 NOTE — Progress Notes (Signed)
Patient noted to be tachycardic in the 150s on telemetry. EKG performed, read as sinus tachycardia. On my personal review, EKG appeared to be consistent with a fib with RVR. This was confirmed with cardiology.  Patient asymptomatic at this time. No chest pain, palpitations, shortness of breath. Blood pressure appears to be stable. Started on metoprolol 25mg  po q6h. HR currently ranging in the 120-130 range. Hopefully after a few doses of BB, his heart rate should stabilize. If HR remains elevated, may need to consider starting cardizem infusion. If he has associated borderline BP, may need to consider amiodarone infusion.  CHADSVASC score is 0, so will hold off on anticoagulation at this point. Will check TSH, Echo.  [employer]

## 2020-11-28 ENCOUNTER — Inpatient Hospital Stay
Admit: 2020-11-28 | Discharge: 2020-11-28 | Disposition: A | Discharge status: Home / Self Care | Payer: Medicaid Other | Attending: Internal Medicine | Admitting: Internal Medicine

## 2020-11-28 LAB — CBC
HCT: 31.3 % — ABNORMAL LOW (ref 39.0–52.0)
Hemoglobin: 10.7 g/dL — ABNORMAL LOW (ref 13.0–17.0)
MCH: 31.6 pg (ref 26.0–34.0)
MCHC: 34.2 g/dL (ref 30.0–36.0)
MCV: 92.3 fL (ref 80.0–100.0)
Platelets: 251 10*3/uL (ref 150–400)
RBC: 3.39 MIL/uL — ABNORMAL LOW (ref 4.22–5.81)
RDW: 13.2 % (ref 11.5–15.5)
WBC: 16.7 10*3/uL — ABNORMAL HIGH (ref 4.0–10.5)
nRBC: 0 % (ref 0.0–0.2)

## 2020-11-28 LAB — TSH: TSH: 0.285 u[IU]/mL — ABNORMAL LOW (ref 0.350–4.500)

## 2020-11-28 LAB — BASIC METABOLIC PANEL
Anion gap: 10 (ref 5–15)
BUN: 14 mg/dL (ref 6–20)
CO2: 26 mmol/L (ref 22–32)
Calcium: 8.4 mg/dL — ABNORMAL LOW (ref 8.9–10.3)
Chloride: 103 mmol/L (ref 98–111)
Creatinine, Ser: 0.49 mg/dL — ABNORMAL LOW (ref 0.61–1.24)
GFR, Estimated: >60 mL/min (ref >60)
Glucose, Bld: 205 mg/dL — ABNORMAL HIGH (ref 70–99)
Potassium: 4 mmol/L (ref 3.5–5.1)
Sodium: 139 mmol/L (ref 135–145)

## 2020-11-28 MED ORDER — DIGOXIN 250 MCG PO TABS
0.2500 mg | ORAL_TABLET | Freq: Every day | ORAL | Status: DC
  Administered 2020-11-29 – 2020-12-01 (×3): 0.25 mg via ORAL
  Filled 2020-11-28 (×4): qty 1

## 2020-11-28 MED ORDER — DIGOXIN 0.25 MG/ML IJ SOLN
0.5000 mg | Freq: Once | INTRAMUSCULAR | Status: AC
  Administered 2020-11-28: 0.5 mg via INTRAVENOUS
  Filled 2020-11-28: qty 2

## 2020-11-28 MED ORDER — VITAMIN D 25 MCG (1000 UNIT) PO TABS
1000.0000 [IU] | ORAL_TABLET | Freq: Every day | ORAL | Status: DC
  Administered 2020-11-28 – 2020-12-01 (×4): 1000 [IU] via ORAL
  Filled 2020-11-28 (×4): qty 1

## 2020-11-28 MED ORDER — ENSURE ENLIVE PO LIQD
237.0000 mL | Freq: Two times a day (BID) | ORAL | Status: DC
  Administered 2020-11-28 – 2020-12-01 (×4): 237 mL via ORAL

## 2020-11-28 NOTE — Progress Notes (Signed)
Pulmonary Medicine          Date: 11/28/2020,   MRN# [id number] Curtis Manning. 1971/02/01     AdmissionWeight: 63.5 kg                 CurrentWeight: 63.3 kg   Referring physician: Dr Billie Ruddy   CHIEF COMPLAINT:   Lung mass   HISTORY OF PRESENT ILLNESS   Curtis Manning. is a 50 y.o. Caucasian male with medical history significant for asthma, ongoing tobacco abuse and GERD who presented to the emergency room with acute onset of significant worsening dyspnea with associated cough productive of clear sputum as well as wheezing.  The patient has been actually having symptoms for the last few weeks.  He was seen on an outpatient basis and was given 2 courses of antibiotics.  He had a chest x-ray that showed suspected lung mass and was supposed to be referred to pulmonology.  He admitted to weight loss of about 35 pounds over the last 2 to 3 months.  He has been having night sweats and fever.  No chest pain or palpitations.  No nausea or vomiting or abdominal pain.  No dysuria, regular hematuria or flank pain.  No bleeding diathesis.  Upon presentation to the ER rate was 136 with otherwise normal vital signs and later heart rate is 123.  Respiratory rate was initially normal and later 29.  Labs revealed hyponatremia 133 and albumin 2.9.  CBC showed minimal leukocytosis of 11.1 and thrombocytosis of 410.  Lactic acid was 1.3.  Influenza antigens and COVID-19 PCR came back negative.    He has been sick >1 month.  He is a smoker currently smokes trying to quit.   I reviewed CT chest independently.   I met with wife Angie and discussed findings.  Patient had additional questions which we discussed.   Patient has severe asthma, he was intubated and placed on mechanical ventilation at age 37.  He has been on prednisone recurrently.  He just finished prednisone taper  We discussed lung mass and biopsy.   11/14/20- patient is stable he is on room air.  He is able to be discharged and  have additional workup done on outpatient.  He has pulmonary workup done at [facility]. We discussed further workup needed including biopsy of lesion on CT chest. He was intubated at [facility] before and had workup there while in ICU and further workup after moving out to medical floor.  He has been seen there with pulmonary and has nebulizer prescribed for asthma.    11/28/20- patient is on room air. He has developed arrythmia.  He denies chest pain and smiling during interview.    PAST MEDICAL HISTORY   Past Medical History:  Diagnosis Date  . Asthma      SURGICAL HISTORY   He never had surgery in the past  FAMILY HISTORY   Family History  Problem Relation Age of Onset  . High blood pressure Mother      SOCIAL HISTORY   Social History   Tobacco Use  . Smoking status: Current Every Day Smoker  . Smokeless tobacco: Never Used     MEDICATIONS    Home Medication:    Current Medication:  Current Facility-Administered Medications:  .  0.9 %  sodium chloride infusion, , Intravenous, PRN, Kathie Dike, MD, Last Rate: 10 mL/hr at 11/27/20 2043, 500 mL at 11/27/20 2043 .  Ampicillin-Sulbactam (UNASYN) 3 g in sodium chloride 0.9 %  100 mL IVPB, 3 g, Intravenous, Q6H, Memon, Jehanzeb, MD, Last Rate: 200 mL/hr at 11/28/20 0844, 3 g at 11/28/20 0844 .  azithromycin (ZITHROMAX) 500 mg in sodium chloride 0.9 % 250 mL IVPB, 500 mg, Intravenous, Q24H, Clarnce Flock, MD, Last Rate: 250 mL/hr at 11/27/20 2044, 500 mg at 11/27/20 2044 .  cholecalciferol (VITAMIN D3) tablet 1,000 Units, 1,000 Units, Oral, Daily, Kathie Dike, MD, 1,000 Units at 11/28/20 1200 .  dexamethasone (DECADRON) tablet 4 mg, 4 mg, Oral, BID, Kathie Dike, MD, 4 mg at 11/28/20 0839 .  dextromethorphan-guaiFENesin (MUCINEX DM) 30-600 MG per 12 hr tablet 1 tablet, 1 tablet, Oral, BID, Blount, Scarlette Shorts T, NP, 1 tablet at 11/28/20 [id number] .  digoxin (LANOXIN) tablet 0.25 mg, 0.25 mg, Oral, Daily, Memon, Jehanzeb, MD .   enoxaparin (LOVENOX) injection 40 mg, 40 mg, Subcutaneous, Q24H, Clarnce Flock, MD, 40 mg at 11/27/20 2259 .  feeding supplement (ENSURE ENLIVE / ENSURE PLUS) liquid 237 mL, 237 mL, Oral, BID BM, Kathie Dike, MD, 237 mL at 11/28/20 1211 .  ipratropium (ATROVENT) nebulizer solution 0.5 mg, 0.5 mg, Nebulization, Q6H PRN, Clarnce Flock, MD, 0.5 mg at 11/28/20 1155 .  levalbuterol (XOPENEX) nebulizer solution 0.63 mg, 0.63 mg, Nebulization, Q6H PRN, Kathie Dike, MD, 0.63 mg at 11/28/20 1155 .  metoprolol tartrate (LOPRESSOR) tablet 25 mg, 25 mg, Oral, Q6H, Memon, Jehanzeb, MD, 25 mg at 11/28/20 1044 .  morphine 4 MG/ML injection 4 mg, 4 mg, Intravenous, Q4H PRN, Clarnce Flock, MD, 4 mg at 11/26/20 0725 .  multivitamin with minerals tablet 1 tablet, 1 tablet, Oral, Daily, Clarnce Flock, MD, 1 tablet at 11/28/20 0840 .  ondansetron (ZOFRAN) tablet 4 mg, 4 mg, Oral, Q6H PRN **OR** ondansetron (ZOFRAN) injection 4 mg, 4 mg, Intravenous, Q6H PRN, Clarnce Flock, MD, 4 mg at 11/26/20 0725 .  oxyCODONE-acetaminophen (PERCOCET/ROXICET) 5-325 MG per tablet 1-2 tablet, 1-2 tablet, Oral, Q4H PRN, Kathie Dike, MD, 2 tablet at 11/28/20 1209 .  pantoprazole (PROTONIX) EC tablet 40 mg, 40 mg, Oral, Daily, Clarnce Flock, MD, 40 mg at 11/28/20 6222 .  polyethylene glycol (MIRALAX / GLYCOLAX) packet 17 g, 17 g, Oral, Daily PRN, Clarnce Flock, MD .  traZODone (DESYREL) tablet 50 mg, 50 mg, Oral, QHS PRN, Clarnce Flock, MD, 50 mg at 11/27/20 2253    ALLERGIES   Patient has no known allergies.     REVIEW OF SYSTEMS    Review of Systems:  Gen:  Denies  fever, sweats, chills weigh loss  HEENT: Denies blurred vision, double vision, ear pain, eye pain, hearing loss, nose bleeds, sore throat Cardiac:  No dizziness, chest pain or heaviness, chest tightness,edema Resp:   Denies cough or sputum porduction, shortness of breath,wheezing, hemoptysis,  Gi: Denies  swallowing difficulty, stomach pain, nausea or vomiting, diarrhea, constipation, bowel incontinence Gu:  Denies bladder incontinence, burning urine Ext:   Denies Joint pain, stiffness or swelling Skin: Denies  skin rash, easy bruising or bleeding or hives Endoc:  Denies polyuria, polydipsia , polyphagia or weight change Psych:   Denies depression, insomnia or hallucinations   Other:  All other systems negative   VS: BP 115/80 (BP Location: Right Arm)   Pulse (!) 106   Temp (!) 97.5 F (36.4 C) (Oral)   Resp 20   Ht 5' 8"  (1.727 m)   Wt 63.3 kg   SpO2 95%   BMI 21.21 kg/m      PHYSICAL EXAM  GENERAL:NAD, no fevers, chills, no weakness no fatigue HEAD: Normocephalic, atraumatic.  EYES: Pupils equal, round, reactive to light. Extraocular muscles intact. No scleral icterus.  MOUTH: Moist mucosal membrane. Dentition intact. No abscess noted.  EAR, NOSE, THROAT: Clear without exudates. No external lesions.  NECK: Supple. No thyromegaly. No nodules. No JVD.  PULMONARY: CTAb CARDIOVASCULAR: S1 and S2. Regular rate and rhythm. No murmurs, rubs, or gallops. No edema. Pedal pulses 2+ bilaterally.  GASTROINTESTINAL: Soft, nontender, nondistended. No masses. Positive bowel sounds. No hepatosplenomegaly.  MUSCULOSKELETAL: No swelling, clubbing, or edema. Range of motion full in all extremities.  NEUROLOGIC: Cranial nerves II through XII are intact. No gross focal neurological deficits. Sensation intact. Reflexes intact.  SKIN: No ulceration, lesions, rashes, or cyanosis. Skin warm and dry. Turgor intact.  PSYCHIATRIC: Mood, affect within normal limits. The patient is awake, alert and oriented x 3. Insight, judgment intact.       IMAGING    DG Chest 2 View  Result Date: 11/25/2020 CLINICAL DATA:  Chest pain and shortness of breath EXAM: CHEST - 2 VIEW COMPARISON:  Chest radiograph 11/18/2020, chest CT 11/11/2020 FINDINGS: Unchanged cardiomediastinal silhouette. There is increased  opacification of the left upper lung, with unchanged appearance of the left perihilar and left lower lobe mass. Small left pleural effusion. No visible pneumothorax. IMPRESSION: Increased opacification of the left upper lung with known left perihilar and left lower lobe masses, consistent with worsening postobstructive pneumonia. Small left pleural effusion. Electronically Signed   By: Maurine Simmering   On: 11/25/2020 19:04   DG Chest 2 View  Result Date: 11/11/2020 CLINICAL DATA:  Shortness of breath EXAM: CHEST - 2 VIEW COMPARISON:  None. FINDINGS: There is an area that appears masslike in the posterior segment of the left lower lobe measuring 6.8 x 6.5 x 6.4 cm. There is extensive airspace opacity throughout the left upper lobe as well. A small area of infiltrate is noted more inferiorly on the left. Right lung is clear. Heart size and pulmonary vascularity normal. Questionable adenopathy left hilum. No bone lesions. IMPRESSION: Suspected neoplastic focus in the superior segment left lower lobe measuring 6.8 x 6.5 x 6.4 cm. Apparent pneumonia throughout the left upper lobe. Ill-defined opacity left lower lobe may represent focal pneumonia, although on the frontal view, this area appears masslike and could represent a second potential neoplasm. This area on frontal view measures 3.7 x 3.1 cm. Advise chest CT for further assessment. Right lung clear. Heart size normal. Suspected left hilar adenopathy. Electronically Signed   By: Lowella Grip III M.D.   On: 11/11/2020 17:41   CT Angio Chest PE W and/or Wo Contrast  Result Date: 11/25/2020 CLINICAL DATA:  History of pneumonia left-sided chest pain EXAM: CT ANGIOGRAPHY CHEST WITH CONTRAST TECHNIQUE: Multidetector CT imaging of the chest was performed using the standard protocol during bolus administration of intravenous contrast. Multiplanar CT image reconstructions and MIPs were obtained to evaluate the vascular anatomy. CONTRAST:  80m OMNIPAQUE IOHEXOL 350  MG/ML SOLN COMPARISON:  Chest x-ray 11/25/2020, CT chest 11/11/2020 FINDINGS: Cardiovascular: Satisfactory opacification of the pulmonary arteries to the segmental level. No evidence of pulmonary embolism. Encasement and diffuse narrowing of left-sided pulmonary vasculature secondary to lung mass. Normal cardiac size. Trace pericardial effusion. Mediastinum/Nodes: No thyroid mass. Mild narrowing of left mainstem bronchus with occlusion of left upper and lower lobe bronchi by tumor. Redemonstrated bulky mediastinal and left hilar adenopathy. Index prevascular node measuring 2.1 cm, 2.2 cm previously. Conglomerate subcarinal and left hilar  adenopathy versus tumor infiltration. Enlarged left cardio phrenic node up to 8 mm, previously 7 mm. Esophageal displacement to the right by mediastinal adenopathy/mass, esophagus cannot be clearly separated from bulky mediastinal mass. Distal esophageal nodes measuring up to 12 mm. Lungs/Pleura: Moderate partially loculated left pleural effusion as before. Large poorly defined medial left upper lobe and left hilar lung mass with mediastinal invasion and or poorly defined adenopathy. Large presumed metastatic mass mostly within the left lower lobe, measuring 8.7 by 5.8 cm, previously 7.5 x 4.1 cm. Subpleural anterior left lower lobe lung mass measures 2.9 x 1.8 cm, previously 2.6 x 1.2 cm, series 6, image 78. Extensive ground-glass density and patchy consolidation in the left upper lobe consistent with postobstructive pneumonia. Mild nodularity along the major fissure. Slightly worsened ground-glass density in the left lower lobe and surrounding the dominant left lower lobe lung mass. New or increased irregular nodularity in the left lateral lung base, series 6, image 77. Upper Abdomen: No acute abnormality. Subcentimeter low-density lesions in the liver too small to further characterize. Musculoskeletal: No acute osseous abnormality Review of the MIP images confirms the above  findings. IMPRESSION: 1. Negative for acute pulmonary embolus. Diffuse narrowing and encasement of left-sided pulmonary vasculature by large lung mass. 2. Large poorly defined left upper lobe and hilar lung mass with bulky mediastinal and left hilar metastatic disease. Possible mediastinal invasion by large left upper lobe lung mass. Occlusion of left upper and lower lobe bronchi by tumor. Metastatic lesions in the left lower lobe measuring up to 8.7 cm, increased compared to prior CT. Slight worsening of diffuse ground-glass density in the left lower lobe, likely worsening postobstructive pneumonia. Continued extensive consolidation and ground-glass densities in the left upper lobe, also consistent with postobstructive pneumonia. Redemonstrated fissural nodularity in the left upper lobe raising concern for metastatic disease. Moderate partially loculated left-sided pleural effusion. Electronically Signed   By: Donavan Foil M.D.   On: 11/25/2020 21:04   CT Angio Chest PE W and/or Wo Contrast  Result Date: 11/11/2020 CLINICAL DATA:  Lung mass. EXAM: CT ANGIOGRAPHY CHEST CT ABDOMEN AND PELVIS WITH CONTRAST TECHNIQUE: Multidetector CT imaging of the chest was performed using the standard protocol during bolus administration of intravenous contrast. Multiplanar CT image reconstructions and MIPs were obtained to evaluate the vascular anatomy. Multidetector CT imaging of the abdomen and pelvis was performed using the standard protocol during bolus administration of intravenous contrast. CONTRAST:  74m OMNIPAQUE IOHEXOL 350 MG/ML SOLN COMPARISON:  Chest x-ray from same day. FINDINGS: CTA CHEST FINDINGS Cardiovascular: Satisfactory opacification of the pulmonary arteries to the segmental level. No evidence of pulmonary embolism. Narrowing of the left main, lobar, and segmental pulmonary arteries due to tumor. Normal heart size. No pericardial effusion. Mediastinum/Nodes: Bulky mediastinal left hilar lymphadenopathy.  Index prevascular lymph node measures 2.2 cm in short axis. Conglomerate subcarinal and left hilar lymphadenopathy and/or tumor, difficult to measure. Lower paraesophageal lymph node measuring 9 mm in short axis (series 4, image 81). Prominent left cardiophrenic angle lymph node measuring 7 mm in short axis (series 4, image 96). No pathologically enlarged right hilar or axillary lymph nodes. The thyroid gland, trachea, and esophagus demonstrate no significant findings. Lungs/Pleura: Large left perihilar and medial upper lobe mass, difficult to measure, with near complete occlusion of the central left upper and lower lobe bronchi. 7.5 x 4.1 cm mass predominantly within the superior segment of the left lower lobe, but crossing the major fissure. 1.2 x 2.6 cm subpleural nodule in the  anterior left lower lobe (series 4, image 73). Patchy consolidation in the left upper lobe due to post obstructive pneumonia. Nodular interlobular septal thickening in the left upper lobe. Nodularity along the left major fissure superiorly. Small to moderate partially loculated left pleural effusion. The right lung is clear. No pneumothorax. Musculoskeletal: No chest wall abnormality. No acute or significant osseous findings. Review of the MIP images confirms the above findings. CT ABDOMEN AND PELVIS FINDINGS Hepatobiliary: Few scattered subcentimeter low-density lesions within the liver, too small to characterize. The gallbladder is unremarkable. No biliary dilatation. Pancreas: Unremarkable. No pancreatic ductal dilatation or surrounding inflammatory changes. Spleen: Normal in size without focal abnormality. Adrenals/Urinary Tract: Adrenal glands are unremarkable. 1.3 cm left renal simple cyst. No renal calculi or hydronephrosis. The bladder is under distended. Stomach/Bowel: Stomach is within normal limits. Appendix appears normal. No evidence of bowel wall thickening, distention, or inflammatory changes. Vascular/Lymphatic: No  significant vascular findings are present. No enlarged abdominal or pelvic lymph nodes. Reproductive: Prostate is unremarkable. Other: Small fat containing left inguinal hernia. No free fluid or pneumoperitoneum. Musculoskeletal: No acute or significant osseous findings. Review of the MIP images confirms the above findings. IMPRESSION: CT chest: 1. Large left perihilar and medial upper lobe mass with mediastinal invasion, consistent with primary lung cancer. Metastases in the left lower lobe measuring up to 7.5 cm. 2. Left upper lobe lymphangitic carcinomatosis. Bulky mediastinal and left hilar nodal metastases. 3. Near complete occlusion of the central left upper and lower lobe bronchi. Left upper lobe postobstructive pneumonia. 4. No evidence of pulmonary embolism. Narrowing of the left pulmonary arteries due to tumor. 5. 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 CT abdomen pelvis: 1. No evidence of metastatic disease in the abdomen or pelvis. Electronically Signed   By: Titus Dubin M.D.   On: 11/11/2020 21:37   CT ABDOMEN PELVIS W CONTRAST  Result Date: 11/11/2020 CLINICAL DATA:  Lung mass. EXAM: CT ANGIOGRAPHY CHEST CT ABDOMEN AND PELVIS WITH CONTRAST TECHNIQUE: Multidetector CT imaging of the chest was performed using the standard protocol during bolus administration of intravenous contrast. Multiplanar CT image reconstructions and MIPs were obtained to evaluate the vascular anatomy. Multidetector CT imaging of the abdomen and pelvis was performed using the standard protocol during bolus administration of intravenous contrast. CONTRAST:  47m OMNIPAQUE IOHEXOL 350 MG/ML SOLN COMPARISON:  Chest x-ray from same day. FINDINGS: CTA CHEST FINDINGS Cardiovascular: Satisfactory opacification of the pulmonary arteries to the segmental level. No evidence of pulmonary embolism. Narrowing of the left main, lobar, and segmental pulmonary arteries due to tumor. Normal heart size. No  pericardial effusion. Mediastinum/Nodes: Bulky mediastinal left hilar lymphadenopathy. Index prevascular lymph node measures 2.2 cm in short axis. Conglomerate subcarinal and left hilar lymphadenopathy and/or tumor, difficult to measure. Lower paraesophageal lymph node measuring 9 mm in short axis (series 4, image 81). Prominent left cardiophrenic angle lymph node measuring 7 mm in short axis (series 4, image 96). No pathologically enlarged right hilar or axillary lymph nodes. The thyroid gland, trachea, and esophagus demonstrate no significant findings. Lungs/Pleura: Large left perihilar and medial upper lobe mass, difficult to measure, with near complete occlusion of the central left upper and lower lobe bronchi. 7.5 x 4.1 cm mass predominantly within the superior segment of the left lower lobe, but crossing the major fissure. 1.2 x 2.6 cm subpleural nodule in the anterior left lower lobe (series 4, image 73). Patchy consolidation in the left upper lobe due to post obstructive pneumonia. Nodular interlobular  septal thickening in the left upper lobe. Nodularity along the left major fissure superiorly. Small to moderate partially loculated left pleural effusion. The right lung is clear. No pneumothorax. Musculoskeletal: No chest wall abnormality. No acute or significant osseous findings. Review of the MIP images confirms the above findings. CT ABDOMEN AND PELVIS FINDINGS Hepatobiliary: Few scattered subcentimeter low-density lesions within the liver, too small to characterize. The gallbladder is unremarkable. No biliary dilatation. Pancreas: Unremarkable. No pancreatic ductal dilatation or surrounding inflammatory changes. Spleen: Normal in size without focal abnormality. Adrenals/Urinary Tract: Adrenal glands are unremarkable. 1.3 cm left renal simple cyst. No renal calculi or hydronephrosis. The bladder is under distended. Stomach/Bowel: Stomach is within normal limits. Appendix appears normal. No evidence of  bowel wall thickening, distention, or inflammatory changes. Vascular/Lymphatic: No significant vascular findings are present. No enlarged abdominal or pelvic lymph nodes. Reproductive: Prostate is unremarkable. Other: Small fat containing left inguinal hernia. No free fluid or pneumoperitoneum. Musculoskeletal: No acute or significant osseous findings. Review of the MIP images confirms the above findings. IMPRESSION: CT chest: 1. Large left perihilar and medial upper lobe mass with mediastinal invasion, consistent with primary lung cancer. Metastases in the left lower lobe measuring up to 7.5 cm. 2. Left upper lobe lymphangitic carcinomatosis. Bulky mediastinal and left hilar nodal metastases. 3. Near complete occlusion of the central left upper and lower lobe bronchi. Left upper lobe postobstructive pneumonia. 4. No evidence of pulmonary embolism. Narrowing of the left pulmonary arteries due to tumor. 5. 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 CT abdomen pelvis: 1. No evidence of metastatic disease in the abdomen or pelvis. Electronically Signed   By: Titus Dubin M.D.   On: 11/11/2020 21:37   DG Chest Port 1 View  Result Date: 11/18/2020 CLINICAL DATA:  50 year old male with history of left lung mass and left pleural effusion. EXAM: PORTABLE CHEST - 1 VIEW COMPARISON:  11/11/2020 FINDINGS: The mediastinal contours partially obscured at the AP window secondary to pulmonary mass. No evidence of cardiomegaly. Resolution of previously visualized small left pleural effusion. No evidence of pneumothorax. Similar appearing left mid lung solid mass measuring up to approximately 7.5 cm. 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 The right lung is clear. No acute osseous abnormality. IMPRESSION: 1. Resolution of left pleural effusion after left thoracentesis. No evidence of  pneumothorax. 2. Similar appearing solid mass in the superior segment left lower lobe, left hilar lymphadenopathy, and left upper lobe consolidative opacities. Ruthann Cancer, MD [facility] Electronically Signed   By: Ruthann Cancer MD   On: 11/18/2020 15:20   US THORACENTESIS ASP PLEURAL SPACE W/IMG GUIDE  Result Date: 11/18/2020 INDICATION: Patient with history of respiratory distress found to have a lung mass with a left-sided pleural effusion. Request is for therapeutic and diagnostic thoracentesis EXAM: ULTRASOUND GUIDED LEFT-SIDED THERAPEUTIC AND DIAGNOSTIC THORACENTESIS MEDICATIONS: Lidocaine 1% 10 mL COMPLICATIONS: None immediate. PROCEDURE: An ultrasound guided thoracentesis was thoroughly discussed with the patient and questions answered. The benefits, risks, alternatives and complications were also discussed. The patient understands and wishes to proceed with the procedure. Written consent was obtained. Ultrasound was performed to localize and mark an adequate pocket of fluid in the left chest. The area was then prepped and draped in the normal sterile fashion. 1% Lidocaine was used for local anesthesia. Under ultrasound guidance a catheter was introduced. Thoracentesis was performed. The catheter was  removed and a dressing applied. FINDINGS: A total of approximately 600 mL of dark amber fluid was removed. Samples were sent to the laboratory as requested by the clinical team. The pleural effusion was noted to be loculated. IMPRESSION: Successful ultrasound guided left-sided therapeutic and diagnostic thoracentesis yielding 600 mL of pleural fluid. Read by: Rushie Nyhan, NP Electronically Signed   By: Ruthann Cancer MD   On: 11/18/2020 15:20      ASSESSMENT/PLAN   Left lung pneumonia with lung mass -patient is chronic smoker, has family history of gastric cancer in paternal grandmother.   -he has constitutional symptoms for appx 3  months -he has not had hemoptysis -he is on empiric therapy  -clinically improved -reviewed bronchoscopy   Probable COPD  - patient is on duoneb and steroids   - will continue prednisone 76m with taper   Left lung mass   - will perform infectious workup for fungal and atypical mycobacterial etiology  - cytology sputum stat  - discussed biopsy with patient he is agreeable.    Left pleural effusion   cytology negative    Thank you for allowing me to participate in the care of this patient.    Patient/Family are satisfied with care plan and all questions have been answered.  This document was prepared using Dragon voice recognition software and may include unintentional dictation errors.     FOttie Glazier M.D.  Division of [facility] [place]

## 2020-11-28 NOTE — Progress Notes (Signed)
PROGRESS NOTE    Curtis Manning.  GBT:[id number] DOB: 09-23-1970 DOA: 11/25/2020 PCP: System, Provider Not In    Brief Narrative:  50 year old male with history of COPD and tobacco use, recent diagnosis of large left lung mass that was diagnosed approximately 2 weeks ago.  He returns to the hospital with significant pain and shortness of breath in his chest.  CT scan was repeated that did not show PE, but showed enlarging lung mass.  He was admitted for further work-up.  Oncology and pulmonology consulted.   Assessment & Plan:   Active Problems:   Obstructive pneumonia   Mass of upper lobe of left lung   Pleural effusion on left   GERD (gastroesophageal reflux disease)   COPD (chronic obstructive pulmonary disease) (HCC)   Sinus tachycardia   Left lung mass, suspicious for malignancy -This was initially noted on CT scan approximately 2 weeks ago -Repeat CT scan during this admission shows slight enlargement -Mass appears to be compressing left pulmonary vasculature as well as occluding the left lower lobe and upper lobe bronchi -Appreciate oncology input -Patient was reviewed by interventional radiology was not felt to be an appropriate candidate for CT guided biopsy -Recommended pulmonology consultation for possible bronchoscopy -Pulmonology consulted  Postobstructive pneumonia -Noted on CT -He does report some cough -Continue on Unasyn and azithromycin -He is also on Decadron  Left pleural effusion -Recent thoracentesis on 5/20 did not show any evidence of malignant cells -Possibly parapneumonic effusion -Fortunately, he does not appear to be short of breath and is on room air at this time  A. fib with RVR -CT chest negative for PE -Noted on EKG -Started on oral metoprolol every 6 hours -Heart rate remains elevated, blood pressure is on the lower side -We will request cardiology input, discussed with Dr. Humphrey Rolls -Started on digoxin -Can consider Cardizem if blood  pressure will allow -TSH pending -Echocardiogram has been ordered -CHA2DS2-VASc score of 0  COPD -He is not wheezing at this time -Continue bronchodilators as needed  GERD -Continue on PPI   DVT prophylaxis: enoxaparin (LOVENOX) injection 40 mg Start: 11/25/20 2215 SCDs Start: 11/25/20 2210  Code Status: full code Family Communication: discussed with patient Disposition Plan: Status is: Inpatient  Remains inpatient appropriate because:IV treatments appropriate due to intensity of illness or inability to take PO and Inpatient level of care appropriate due to severity of illness   Dispo: The patient is from: Home              Anticipated d/c is to: Home              Patient currently is not medically stable to d/c.   Difficult to place patient No   Consultants:   Oncology  Pulmonology  Procedures:     Antimicrobials:   unasyn 5/28>  Azithromycin 5/28>    Subjective: Reports that overall pain in his chest and back pain is doing better with pain medications.  Overall cough is also better.  Denies any palpitations or lightheadedness.  He does become short of breath on exertion, but this resolves with rest  Objective: Vitals:   11/28/20 0344 11/28/20 0840 11/28/20 1155 11/28/20 1158  BP: 92/75 117/81  115/80  Pulse: 96 100 (!) 115 (!) 106  Resp: 14 18 16 20   Temp: 97.8 F (36.6 C) 97.7 F (36.5 C)  (!) 97.5 F (36.4 C)  TempSrc: Oral Oral  Oral  SpO2: 96% 95% 94% 95%  Weight:  Height:        Intake/Output Summary (Last 24 hours) at 11/28/2020 1353 Last data filed at 11/28/2020 1351 Gross per 24 hour  Intake 480 ml  Output --  Net 480 ml   Filed Weights   11/25/20 1829 11/26/20 1450  Weight: 63.5 kg 63.3 kg    Examination:  General exam: Alert, awake, oriented x 3 Respiratory system: Clear to auscultation. Respiratory effort normal. Cardiovascular system: Irregular. No murmurs, rubs, gallops. Gastrointestinal system: Abdomen is  nondistended, soft and nontender. No organomegaly or masses felt. Normal bowel sounds heard. Central nervous system: Alert and oriented. No focal neurological deficits. Extremities: No C/C/E, +pedal pulses Skin: No rashes, lesions or ulcers Psychiatry: Judgement and insight appear normal. Mood & affect appropriate.      Data Reviewed: I have personally reviewed following labs and imaging studies  CBC: Recent Labs  Lab 11/25/20 1830 11/26/20 0927 11/28/20 0651  WBC 20.0* 22.8* 16.7*  HGB 13.5 12.7* 10.7*  HCT 39.9 37.4* 31.3*  MCV 93.9 92.6 92.3  PLT 287 264 563   Basic Metabolic Panel: Recent Labs  Lab 11/25/20 1830 11/26/20 0927 11/28/20 0651  NA 130* 131* 139  K 4.4 4.3 4.0  CL 93* 95* 103  CO2 26 23 26   GLUCOSE 121* 111* 205*  BUN 7 10 14   CREATININE 0.52* 0.59* 0.49*  CALCIUM 8.8* 8.7* 8.4*   GFR: Estimated Creatinine Clearance: 98.9 mL/min (A) (by C-G formula based on SCr of 0.49 mg/dL (L)). Liver Function Tests: No results for input(s): AST, ALT, ALKPHOS, BILITOT, PROT, ALBUMIN in the last 168 hours. No results for input(s): LIPASE, AMYLASE in the last 168 hours. No results for input(s): AMMONIA in the last 168 hours. Coagulation Profile: Recent Labs  Lab 11/25/20 2016  INR 1.1   Cardiac Enzymes: No results for input(s): CKTOTAL, CKMB, CKMBINDEX, TROPONINI in the last 168 hours. BNP (last 3 results) No results for input(s): PROBNP in the last 8760 hours. HbA1C: No results for input(s): HGBA1C in the last 72 hours. CBG: No results for input(s): GLUCAP in the last 168 hours. Lipid Profile: No results for input(s): CHOL, HDL, LDLCALC, TRIG, CHOLHDL, LDLDIRECT in the last 72 hours. Thyroid Function Tests: Recent Labs    11/28/20 0651  TSH 0.285*   Anemia Panel: No results for input(s): VITAMINB12, FOLATE, FERRITIN, TIBC, IRON, RETICCTPCT in the last 72 hours. Sepsis Labs: Recent Labs  Lab 11/25/20 2016  LATICACIDVEN 1.2    Recent Results  (from the past 240 hour(s))  Fungus Culture With Stain     Status: None (Preliminary result)   Collection Time: 11/18/20  2:40 PM   Specimen: PATH Cytology Pleural fluid  Result Value Ref Range Status   Fungus Stain Final report  Final    Comment: (NOTE) Performed At: [facility] [place], [place] [id number] Rush Farmer MD GO:[id number]    Fungus (Mycology) Culture PENDING  Incomplete   Fungal Source PLEURAL  Final    Comment: Performed at [facility], [address] [place], [place] [address]  Acid Fast Smear (AFB)     Status: None   Collection Time: 11/18/20  2:40 PM   Specimen: PATH Cytology Pleural fluid  Result Value Ref Range Status   AFB Specimen Processing Concentration  Final   Acid Fast Smear Negative  Final    Comment: (NOTE) Performed At: [facility] [address] [place], [place] [id number] Rush Farmer MD OE:[id number]    Source (AFB) PLEURAL  Final  Comment: Performed at [facility], [address] [place], [place] [address]  Body fluid culture w Gram Stain     Status: None   Collection Time: 11/18/20  2:40 PM   Specimen: PATH Cytology Pleural fluid  Result Value Ref Range Status   Specimen Description   Final    PLEURAL Performed at [facility], [address] [place], [place] [address]    Special Requests   Final    NONE Performed at [facility], [address] [place], [place] [address]    Gram Stain   Final    MODERATE WBC PRESENT, PREDOMINANTLY MONONUCLEAR NO ORGANISMS SEEN    Culture   Final    NO GROWTH 3 DAYS Performed at [facility], [place] [address], [place], [place] [address]    Report Status 11/22/2020 FINAL  Final  Fungus Culture Result     Status: None   Collection Time: 11/18/20  2:40 PM  Result Value Ref Range Status   Result 1 Comment  Final    Comment: (NOTE) KOH/Calcofluor preparation:  no fungus observed. Performed At: [facility] [place], [place] [id number] Rush Farmer MD WN:[id number]   Resp Panel by RT-PCR (Flu A&B, Covid) Nasopharyngeal Swab     Status: None   Collection Time: 11/25/20  7:54 PM   Specimen: Nasopharyngeal Swab; Nasopharyngeal(NP) swabs in vial transport medium  Result Value Ref Range Status   SARS Coronavirus 2 by RT PCR NEGATIVE NEGATIVE Final    Comment: (NOTE) SARS-CoV-2 target nucleic acids are NOT DETECTED.  The SARS-CoV-2 RNA is generally detectable in upper respiratory specimens during the acute phase of infection. The lowest concentration of SARS-CoV-2 viral copies this assay can detect is 138 copies/mL. A negative result does not preclude SARS-Cov-2 infection and should not be used as the sole basis for treatment or other patient management decisions. A negative result may occur with  improper specimen collection/handling, submission of specimen other than nasopharyngeal swab, presence of viral mutation(s) within the areas targeted by this assay, and inadequate number of viral copies(<138 copies/mL). A negative result must be combined with clinical observations, patient history, and epidemiological information. The expected result is Negative.  Fact Sheet for Patients:  EntrepreneurPulse.com.au  Fact Sheet for Healthcare Providers:  IncredibleEmployment.be  This test is no t yet approved or cleared by the [place] FDA and  has been authorized for detection and/or diagnosis of SARS-CoV-2 by FDA under an Emergency Use Authorization (EUA). This EUA will remain  in effect (meaning this test can be used) for the duration of the COVID-19 declaration under Section 564(b)(1) of the Act, 21 U.S.C.section 360bbb-3(b)(1), unless the authorization is terminated  or revoked sooner.       Influenza A by PCR NEGATIVE NEGATIVE Final   Influenza B by PCR NEGATIVE NEGATIVE Final    Comment: (NOTE) The Xpert Xpress  SARS-CoV-2/FLU/RSV plus assay is intended as an aid in the diagnosis of influenza from Nasopharyngeal swab specimens and should not be used as a sole basis for treatment. Nasal washings and aspirates are unacceptable for Xpert Xpress SARS-CoV-2/FLU/RSV testing.  Fact Sheet for Patients: EntrepreneurPulse.com.au  Fact Sheet for Healthcare Providers: IncredibleEmployment.be  This test is not yet approved or cleared by the [place] FDA and has been authorized for detection and/or diagnosis of SARS-CoV-2 by FDA under an Emergency Use Authorization (EUA). This EUA will remain in effect (meaning this test can be used) for the duration of the  COVID-19 declaration under Section 564(b)(1) of the Act, 21 U.S.C. section 360bbb-3(b)(1), unless the authorization is terminated or revoked.  Performed at [facility], [address] [place], [place] [address]   Blood Culture (routine x 2)     Status: None (Preliminary result)   Collection Time: 11/25/20  8:17 PM   Specimen: BLOOD  Result Value Ref Range Status   Specimen Description BLOOD  LAC  Final   Special Requests   Final    BOTTLES DRAWN AEROBIC AND ANAEROBIC Blood Culture adequate volume   Culture   Final    NO GROWTH 3 DAYS Performed at [facility], [address] [place], [place] [address]    Report Status PENDING  Incomplete  Blood Culture (routine x 2)     Status: None (Preliminary result)   Collection Time: 11/25/20  8:17 PM   Specimen: BLOOD  Result Value Ref Range Status   Specimen Description BLOOD  RIGHT HAND  Final   Special Requests   Final    BOTTLES DRAWN AEROBIC AND ANAEROBIC Blood Culture results may not be optimal due to an inadequate volume of blood received in culture bottles   Culture   Final    NO GROWTH 3 DAYS Performed at [facility], [address], [place], [place] [address]    Report Status PENDING  Incomplete          Radiology Studies: No results found.      Scheduled Meds: . cholecalciferol  1,000 Units Oral Daily  . dexamethasone  4 mg Oral BID  . dextromethorphan-guaiFENesin  1 tablet Oral BID  . digoxin  0.25 mg Oral Daily  . enoxaparin (LOVENOX) injection  40 mg Subcutaneous Q24H  . feeding supplement  237 mL Oral BID BM  . metoprolol tartrate  25 mg Oral Q6H  . multivitamin with minerals  1 tablet Oral Daily  . pantoprazole  40 mg Oral Daily   Continuous Infusions: . sodium chloride 500 mL (11/27/20 2043)  . ampicillin-sulbactam (UNASYN) IV 3 g (11/28/20 0844)  . azithromycin 500 mg (11/27/20 2044)     LOS: 3 days    Time spent: 41mins    Kathie Dike, MD [facility]   If 7PM-7AM, please contact night-coverage [email or web address]  11/28/2020, 1:53 PM

## 2020-11-28 NOTE — Progress Notes (Signed)
Initial Nutrition Assessment  DOCUMENTATION CODES:  Severe malnutrition in context of acute illness/injury  INTERVENTION:   Continue current diet as ordered, encouraged PO intake  Ensure Enlive po BID, each supplement provides 350 kcal and 20 grams of protein  MVI with minerals daily  1000 IU of vitamin d daily for hx of deficiency  NUTRITION DIAGNOSIS:  Severe Malnutrition (in the context of acute illness) related to poor appetite as evidenced by moderate fat depletion,moderate muscle depletion.  GOAL:  Patient will meet greater than or equal to 90% of their needs  MONITOR:  PO intake,Supplement acceptance,Weight trends,Labs  REASON FOR ASSESSMENT:  Consult Poor PO,Assessment of nutrition requirement/status  ASSESSMENT:  Pt presented to ED with worsening chest pain and SOB Recently admitted with similar complaints (5/13-5/16) and followed by RD. Lung mass seen during previous admission appeared larger this admission. Oncology and pulmonology consulted. PMH includes asthma, tobacco abuse, and GERD  Left lung mass was noted on CT about 2 weeks PTA. Repeat scan in ED shows enlargement. Thoracentesis last admission and cytology negative for malignant cells. Pulmonology consult pending, possible bronchoscopy.   Pt resting in bed at the time of visit. Known to RD from previous admission. Pt reports that intake has been pretty good since last admission. Able to tolerate meals and GI discomfort is minimal. Ha not tried ensure supplements yet, but RN recently brought one in for him. Unsure of weight, but does not think he has lost anymore. Encouraged PO intake and supplement use.  Average Meal Intake: . 5/27-5/30: 100% intake x 1 recorded meal  Nutritionally Relevant Medications Scheduled Meds: . dexamethasone  4 mg Oral BID  . multivitamin with minerals  1 tablet Oral Daily  . pantoprazole  40 mg Oral Daily   Continuous Infusions: . sodium chloride 500 mL (11/27/20 2043)  .  ampicillin-sulbactam (UNASYN) IV 3 g (11/28/20 0844)  . azithromycin 500 mg (11/27/20 2044)   PRN Meds: ondansetron, polyethylene glycol, traZODone  Labs reviewed.  NUTRITION - FOCUSED PHYSICAL EXAM: Flowsheet Row Most Recent Value  Orbital Region Mild depletion  Upper Arm Region Moderate depletion  Thoracic and Lumbar Region Moderate depletion  Buccal Region Mild depletion  Temple Region Mild depletion  Clavicle Bone Region Moderate depletion  Clavicle and Acromion Bone Region Moderate depletion  Scapular Bone Region Moderate depletion  Dorsal Hand Mild depletion  Patellar Region Severe depletion  Anterior Thigh Region Severe depletion  Posterior Calf Region Severe depletion  Edema (RD Assessment) None  Hair Reviewed  Eyes Reviewed  Mouth Reviewed  Skin Reviewed  Nails Reviewed     Diet Order:   Diet Order            Diet regular Room service appropriate? Yes; Fluid consistency: Thin  Diet effective now                EDUCATION NEEDS:  No education needs have been identified at this time  Skin:  Skin Assessment: Reviewed RN Assessment  Last BM:  5/29 per RN documentation  Height:  Ht Readings from Last 1 Encounters:  11/26/20 5\' 8"  (1.727 m)    Weight:  Wt Readings from Last 1 Encounters:  11/26/20 63.3 kg    Ideal Body Weight:  70 kg  BMI:  Body mass index is 21.21 kg/m.  Estimated Nutritional Needs:   Kcal:  2000-2200 kcal/d  Protein:  100-120 g/d  Fluid:  >2L/d   Ranell Patrick, RD, LDN Clinical Dietitian Pager on [place]

## 2020-11-28 NOTE — Progress Notes (Signed)
CCMD called to report pt's HR trending up to 160's/pt asymptomatic/ up to BR. Pt back to bed/HR now 130's-140's/MD made aware/new orders for digoxin already in place. Will administer medication and continue to monitor.

## 2020-11-28 NOTE — Progress Notes (Signed)
*  PRELIMINARY RESULTS* Echocardiogram 2D Echocardiogram has been performed.  Curtis Manning 11/28/2020, 5:57 PM

## 2020-11-29 LAB — ECHOCARDIOGRAM COMPLETE
AR max vel: 1.92 cm2
AV Peak grad: 8.2 mmHg
Ao pk vel: 1.43 m/s
Area-P 1/2: 5.97 cm2
Height: 68 in
S' Lateral: 2.58 cm
Weight: 2393.6 oz

## 2020-11-29 LAB — T4, FREE: Free T4: 0.81 ng/dL (ref 0.61–1.12)

## 2020-11-29 MED ORDER — METOPROLOL TARTRATE 50 MG PO TABS
50.0000 mg | ORAL_TABLET | Freq: Four times a day (QID) | ORAL | Status: DC
  Administered 2020-11-29 – 2020-12-01 (×9): 50 mg via ORAL
  Filled 2020-11-29 (×9): qty 1

## 2020-11-29 MED ORDER — DILTIAZEM HCL-DEXTROSE 125-5 MG/125ML-% IV SOLN (PREMIX)
5.0000 mg/h | INTRAVENOUS | Status: DC
  Administered 2020-11-29 – 2020-11-30 (×2): 5 mg/h via INTRAVENOUS
  Filled 2020-11-29 (×2): qty 125

## 2020-11-29 MED ORDER — ENOXAPARIN SODIUM 40 MG/0.4ML IJ SOSY
40.0000 mg | PREFILLED_SYRINGE | INTRAMUSCULAR | Status: DC
  Administered 2020-11-30: 40 mg via SUBCUTANEOUS
  Filled 2020-11-29: qty 0.4

## 2020-11-29 NOTE — Progress Notes (Signed)
Chief Complaint: Patient was seen in consultation today for lung mass biopsy  Referring Physician(s): Dr. Lanney Gins  Supervising Physician: Ruthann Cancer  Patient Status: [facility] - In-pt  History of Present Illness: Curtis Manning. is a 50 y.o. male with newly found large left lung masses. This has caused secondary cardiac issues including a.fib with RVR. He underwent thoracentesis on 5/20 but cytology was non-diagnostic. Pulmonary was consulted and feels the pt is very high risk for anesthesia due to his cardiac issues and thus bronchoscopy deemed unsafe at this time. IR is asked to perform percutaneous biopsy. PMHx, meds, labs, imaging, allergies reviewed. Feels well, no recent fevers, chills, illness. Family at bedside.   Past Medical History:  Diagnosis Date  . Asthma     History reviewed. No pertinent surgical history.  Allergies: Patient has no known allergies.  Medications:  Current Facility-Administered Medications:  .  0.9 %  sodium chloride infusion, , Intravenous, PRN, Kathie Dike, MD, Last Rate: 10 mL/hr at 11/27/20 2043, 500 mL at 11/27/20 2043 .  Ampicillin-Sulbactam (UNASYN) 3 g in sodium chloride 0.9 % 100 mL IVPB, 3 g, Intravenous, Q6H, Memon, Jehanzeb, MD, Last Rate: 200 mL/hr at 11/29/20 1017, 3 g at 11/29/20 1017 .  azithromycin (ZITHROMAX) 500 mg in sodium chloride 0.9 % 250 mL IVPB, 500 mg, Intravenous, Q24H, Clarnce Flock, MD, Last Rate: 250 mL/hr at 11/28/20 2026, 500 mg at 11/28/20 2026 .  cholecalciferol (VITAMIN D3) tablet 1,000 Units, 1,000 Units, Oral, Daily, Kathie Dike, MD, 1,000 Units at 11/29/20 1000 .  dexamethasone (DECADRON) tablet 4 mg, 4 mg, Oral, BID, Memon, Jehanzeb, MD, 4 mg at 11/29/20 1000 .  dextromethorphan-guaiFENesin (MUCINEX DM) 30-600 MG per 12 hr tablet 1 tablet, 1 tablet, Oral, BID, Blount, Xenia T, NP, 1 tablet at 11/29/20 1000 .  digoxin (LANOXIN) tablet 0.25 mg, 0.25 mg, Oral, Daily, Memon, Jehanzeb, MD,  0.25 mg at 11/29/20 1000 .  [START ON 11/30/2020] enoxaparin (LOVENOX) injection 40 mg, 40 mg, Subcutaneous, Q24H, Karely Hurtado, PA-C .  feeding supplement (ENSURE ENLIVE / ENSURE PLUS) liquid 237 mL, 237 mL, Oral, BID BM, Memon, Jehanzeb, MD, 237 mL at 11/29/20 1001 .  ipratropium (ATROVENT) nebulizer solution 0.5 mg, 0.5 mg, Nebulization, Q6H PRN, Clarnce Flock, MD, 0.5 mg at 11/29/20 0132 .  levalbuterol (XOPENEX) nebulizer solution 0.63 mg, 0.63 mg, Nebulization, Q6H PRN, Kathie Dike, MD, 0.63 mg at 11/29/20 1000 .  metoprolol tartrate (LOPRESSOR) tablet 50 mg, 50 mg, Oral, Q6H, Neoma Laming A, MD, 50 mg at 11/29/20 1000 .  morphine 4 MG/ML injection 4 mg, 4 mg, Intravenous, Q4H PRN, Clarnce Flock, MD, 4 mg at 11/26/20 0725 .  multivitamin with minerals tablet 1 tablet, 1 tablet, Oral, Daily, Clarnce Flock, MD, 1 tablet at 11/29/20 1000 .  ondansetron (ZOFRAN) tablet 4 mg, 4 mg, Oral, Q6H PRN **OR** ondansetron (ZOFRAN) injection 4 mg, 4 mg, Intravenous, Q6H PRN, Clarnce Flock, MD, 4 mg at 11/26/20 0725 .  oxyCODONE-acetaminophen (PERCOCET/ROXICET) 5-325 MG per tablet 1-2 tablet, 1-2 tablet, Oral, Q4H PRN, Kathie Dike, MD, 2 tablet at 11/29/20 0959 .  pantoprazole (PROTONIX) EC tablet 40 mg, 40 mg, Oral, Daily, Clarnce Flock, MD, 40 mg at 11/29/20 1000 .  polyethylene glycol (MIRALAX / GLYCOLAX) packet 17 g, 17 g, Oral, Daily PRN, Clarnce Flock, MD .  traZODone (DESYREL) tablet 50 mg, 50 mg, Oral, QHS PRN, Clarnce Flock, MD, 50 mg at 11/27/20 2253    Family History  Problem Relation Age of Onset  . High blood pressure Mother     Social History   Socioeconomic History  . Marital status: Married    Spouse name: Not on file  . Number of children: Not on file  . Years of education: Not on file  . Highest education level: Not on file  Occupational History  . Not on file  Tobacco Use  . Smoking status: Current Every Day Smoker  . Smokeless  tobacco: Never Used  Substance and Sexual Activity  . Alcohol use: Not on file  . Drug use: Not on file  . Sexual activity: Not on file  Other Topics Concern  . Not on file  Social History Narrative   Patient is a active smoker.;  Also liquor.  Lives with his wife in [place].  No children.  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  All other systems are negative.  Review of Systems  Vital Signs: BP 123/81 (BP Location: Left Arm)   Pulse (!) 129   Temp 98 F (36.7 C) (Oral)   Resp 18   Ht 5\' 8"  (1.727 m)   Wt 67.9 kg   SpO2 96%   BMI 22.75 kg/m   Physical Exam Constitutional:      Appearance: Normal appearance. He is not ill-appearing.  HENT:     Mouth/Throat:     Mouth: Mucous membranes are moist.     Pharynx: Oropharynx is clear.  Cardiovascular:     Rate and Rhythm: Tachycardia present. Rhythm irregular.     Heart sounds: Normal heart sounds.  Pulmonary:     Effort: Pulmonary effort is normal. No respiratory distress.     Breath sounds: Normal breath sounds.  Neurological:     General: No focal deficit present.     Mental Status: He is alert and oriented to person, place, and time.  Psychiatric:        Mood and Affect: Mood normal.        Thought Content: Thought content normal.        Judgment: Judgment normal.       Imaging: DG Chest 2 View  Result Date: 11/25/2020 CLINICAL DATA:  Chest pain and shortness of breath EXAM: CHEST - 2 VIEW COMPARISON:  Chest radiograph 11/18/2020, chest CT 11/11/2020 FINDINGS: Unchanged cardiomediastinal silhouette. There is increased opacification of the left upper lung, with unchanged appearance of the left  perihilar and left lower lobe mass. Small left pleural effusion. No visible pneumothorax. IMPRESSION: Increased opacification of the left upper lung with known left perihilar and left lower lobe masses, consistent with worsening postobstructive pneumonia. Small left pleural effusion. Electronically Signed   By: Maurine Simmering   On: 11/25/2020 19:04   DG Chest 2 View  Result Date: 11/11/2020 CLINICAL DATA:  Shortness of breath EXAM: CHEST - 2 VIEW COMPARISON:  None. FINDINGS: There is an area that appears masslike in the posterior segment of the left lower lobe measuring 6.8 x 6.5 x 6.4 cm. There is extensive airspace opacity throughout the left upper lobe as well. A small area of infiltrate is noted more inferiorly on the left. Right  lung is clear. Heart size and pulmonary vascularity normal. Questionable adenopathy left hilum. No bone lesions. IMPRESSION: Suspected neoplastic focus in the superior segment left lower lobe measuring 6.8 x 6.5 x 6.4 cm. Apparent pneumonia throughout the left upper lobe. Ill-defined opacity left lower lobe may represent focal pneumonia, although on the frontal view, this area appears masslike and could represent a second potential neoplasm. This area on frontal view measures 3.7 x 3.1 cm. Advise chest CT for further assessment. Right lung clear. Heart size normal. Suspected left hilar adenopathy. Electronically Signed   By: Lowella Grip III M.D.   On: 11/11/2020 17:41   CT Angio Chest PE W and/or Wo Contrast  Result Date: 11/25/2020 CLINICAL DATA:  History of pneumonia left-sided chest pain EXAM: CT ANGIOGRAPHY CHEST WITH CONTRAST TECHNIQUE: Multidetector CT imaging of the chest was performed using the standard protocol during bolus administration of intravenous contrast. Multiplanar CT image reconstructions and MIPs were obtained to evaluate the vascular anatomy. CONTRAST:  34mL OMNIPAQUE IOHEXOL 350 MG/ML SOLN COMPARISON:  Chest x-ray 11/25/2020, CT chest 11/11/2020  FINDINGS: Cardiovascular: Satisfactory opacification of the pulmonary arteries to the segmental level. No evidence of pulmonary embolism. Encasement and diffuse narrowing of left-sided pulmonary vasculature secondary to lung mass. Normal cardiac size. Trace pericardial effusion. Mediastinum/Nodes: No thyroid mass. Mild narrowing of left mainstem bronchus with occlusion of left upper and lower lobe bronchi by tumor. Redemonstrated bulky mediastinal and left hilar adenopathy. Index prevascular node measuring 2.1 cm, 2.2 cm previously. Conglomerate subcarinal and left hilar adenopathy versus tumor infiltration. Enlarged left cardio phrenic node up to 8 mm, previously 7 mm. Esophageal displacement to the right by mediastinal adenopathy/mass, esophagus cannot be clearly separated from bulky mediastinal mass. Distal esophageal nodes measuring up to 12 mm. Lungs/Pleura: Moderate partially loculated left pleural effusion as before. Large poorly defined medial left upper lobe and left hilar lung mass with mediastinal invasion and or poorly defined adenopathy. Large presumed metastatic mass mostly within the left lower lobe, measuring 8.7 by 5.8 cm, previously 7.5 x 4.1 cm. Subpleural anterior left lower lobe lung mass measures 2.9 x 1.8 cm, previously 2.6 x 1.2 cm, series 6, image 78. Extensive ground-glass density and patchy consolidation in the left upper lobe consistent with postobstructive pneumonia. Mild nodularity along the major fissure. Slightly worsened ground-glass density in the left lower lobe and surrounding the dominant left lower lobe lung mass. New or increased irregular nodularity in the left lateral lung base, series 6, image 77. Upper Abdomen: No acute abnormality. Subcentimeter low-density lesions in the liver too small to further characterize. Musculoskeletal: No acute osseous abnormality Review of the MIP images confirms the above findings. IMPRESSION: 1. Negative for acute pulmonary embolus. Diffuse  narrowing and encasement of left-sided pulmonary vasculature by large lung mass. 2. Large poorly defined left upper lobe and hilar lung mass with bulky mediastinal and left hilar metastatic disease. Possible mediastinal invasion by large left upper lobe lung mass. Occlusion of left upper and lower lobe bronchi by tumor. Metastatic lesions in the left lower lobe measuring up to 8.7 cm, increased compared to prior CT. Slight worsening of diffuse ground-glass density in the left lower lobe, likely worsening postobstructive pneumonia. Continued extensive consolidation and ground-glass densities in the left upper lobe, also consistent with postobstructive pneumonia. Redemonstrated fissural nodularity in the left upper lobe raising concern for metastatic disease. Moderate partially loculated left-sided pleural effusion. Electronically Signed   By: Donavan Foil M.D.   On: 11/25/2020 21:04   CT  Angio Chest PE W and/or Wo Contrast  Result Date: 11/11/2020 CLINICAL DATA:  Lung mass. EXAM: CT ANGIOGRAPHY CHEST CT ABDOMEN AND PELVIS WITH CONTRAST TECHNIQUE: Multidetector CT imaging of the chest was performed using the standard protocol during bolus administration of intravenous contrast. Multiplanar CT image reconstructions and MIPs were obtained to evaluate the vascular anatomy. Multidetector CT imaging of the abdomen and pelvis was performed using the standard protocol during bolus administration of intravenous contrast. CONTRAST:  64mL OMNIPAQUE IOHEXOL 350 MG/ML SOLN COMPARISON:  Chest x-ray from same day. FINDINGS: CTA CHEST FINDINGS Cardiovascular: Satisfactory opacification of the pulmonary arteries to the segmental level. No evidence of pulmonary embolism. Narrowing of the left main, lobar, and segmental pulmonary arteries due to tumor. Normal heart size. No pericardial effusion. Mediastinum/Nodes: Bulky mediastinal left hilar lymphadenopathy. Index prevascular lymph node measures 2.2 cm in short axis. Conglomerate  subcarinal and left hilar lymphadenopathy and/or tumor, difficult to measure. Lower paraesophageal lymph node measuring 9 mm in short axis (series 4, image 81). Prominent left cardiophrenic angle lymph node measuring 7 mm in short axis (series 4, image 96). No pathologically enlarged right hilar or axillary lymph nodes. The thyroid gland, trachea, and esophagus demonstrate no significant findings. Lungs/Pleura: Large left perihilar and medial upper lobe mass, difficult to measure, with near complete occlusion of the central left upper and lower lobe bronchi. 7.5 x 4.1 cm mass predominantly within the superior segment of the left lower lobe, but crossing the major fissure. 1.2 x 2.6 cm subpleural nodule in the anterior left lower lobe (series 4, image 73). Patchy consolidation in the left upper lobe due to post obstructive pneumonia. Nodular interlobular septal thickening in the left upper lobe. Nodularity along the left major fissure superiorly. Small to moderate partially loculated left pleural effusion. The right lung is clear. No pneumothorax. Musculoskeletal: No chest wall abnormality. No acute or significant osseous findings. Review of the MIP images confirms the above findings. CT ABDOMEN AND PELVIS FINDINGS Hepatobiliary: Few scattered subcentimeter low-density lesions within the liver, too small to characterize. The gallbladder is unremarkable. No biliary dilatation. Pancreas: Unremarkable. No pancreatic ductal dilatation or surrounding inflammatory changes. Spleen: Normal in size without focal abnormality. Adrenals/Urinary Tract: Adrenal glands are unremarkable. 1.3 cm left renal simple cyst. No renal calculi or hydronephrosis. The bladder is under distended. Stomach/Bowel: Stomach is within normal limits. Appendix appears normal. No evidence of bowel wall thickening, distention, or inflammatory changes. Vascular/Lymphatic: No significant vascular findings are present. No enlarged abdominal or pelvic lymph  nodes. Reproductive: Prostate is unremarkable. Other: Small fat containing left inguinal hernia. No free fluid or pneumoperitoneum. Musculoskeletal: No acute or significant osseous findings. Review of the MIP images confirms the above findings. IMPRESSION: CT chest: 1. Large left perihilar and medial upper lobe mass with mediastinal invasion, consistent with primary lung cancer. Metastases in the left lower lobe measuring up to 7.5 cm. 2. Left upper lobe lymphangitic carcinomatosis. Bulky mediastinal and left hilar nodal metastases. 3. Near complete occlusion of the central left upper and lower lobe bronchi. Left upper lobe postobstructive pneumonia. 4. No evidence of pulmonary embolism. Narrowing of the left pulmonary arteries due to tumor. 5. 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 CT abdomen pelvis: 1. No evidence of metastatic disease in the abdomen or pelvis. Electronically Signed   By: Titus Dubin M.D.   On: 11/11/2020 21:37   CT ABDOMEN PELVIS W CONTRAST  Result Date: 11/11/2020 CLINICAL DATA:  Lung mass. EXAM: CT ANGIOGRAPHY CHEST CT ABDOMEN  AND PELVIS WITH CONTRAST TECHNIQUE: Multidetector CT imaging of the chest was performed using the standard protocol during bolus administration of intravenous contrast. Multiplanar CT image reconstructions and MIPs were obtained to evaluate the vascular anatomy. Multidetector CT imaging of the abdomen and pelvis was performed using the standard protocol during bolus administration of intravenous contrast. CONTRAST:  31mL OMNIPAQUE IOHEXOL 350 MG/ML SOLN COMPARISON:  Chest x-ray from same day. FINDINGS: CTA CHEST FINDINGS Cardiovascular: Satisfactory opacification of the pulmonary arteries to the segmental level. No evidence of pulmonary embolism. Narrowing of the left main, lobar, and segmental pulmonary arteries due to tumor. Normal heart size. No pericardial effusion. Mediastinum/Nodes: Bulky mediastinal left hilar  lymphadenopathy. Index prevascular lymph node measures 2.2 cm in short axis. Conglomerate subcarinal and left hilar lymphadenopathy and/or tumor, difficult to measure. Lower paraesophageal lymph node measuring 9 mm in short axis (series 4, image 81). Prominent left cardiophrenic angle lymph node measuring 7 mm in short axis (series 4, image 96). No pathologically enlarged right hilar or axillary lymph nodes. The thyroid gland, trachea, and esophagus demonstrate no significant findings. Lungs/Pleura: Large left perihilar and medial upper lobe mass, difficult to measure, with near complete occlusion of the central left upper and lower lobe bronchi. 7.5 x 4.1 cm mass predominantly within the superior segment of the left lower lobe, but crossing the major fissure. 1.2 x 2.6 cm subpleural nodule in the anterior left lower lobe (series 4, image 73). Patchy consolidation in the left upper lobe due to post obstructive pneumonia. Nodular interlobular septal thickening in the left upper lobe. Nodularity along the left major fissure superiorly. Small to moderate partially loculated left pleural effusion. The right lung is clear. No pneumothorax. Musculoskeletal: No chest wall abnormality. No acute or significant osseous findings. Review of the MIP images confirms the above findings. CT ABDOMEN AND PELVIS FINDINGS Hepatobiliary: Few scattered subcentimeter low-density lesions within the liver, too small to characterize. The gallbladder is unremarkable. No biliary dilatation. Pancreas: Unremarkable. No pancreatic ductal dilatation or surrounding inflammatory changes. Spleen: Normal in size without focal abnormality. Adrenals/Urinary Tract: Adrenal glands are unremarkable. 1.3 cm left renal simple cyst. No renal calculi or hydronephrosis. The bladder is under distended. Stomach/Bowel: Stomach is within normal limits. Appendix appears normal. No evidence of bowel wall thickening, distention, or inflammatory changes.  Vascular/Lymphatic: No significant vascular findings are present. No enlarged abdominal or pelvic lymph nodes. Reproductive: Prostate is unremarkable. Other: Small fat containing left inguinal hernia. No free fluid or pneumoperitoneum. Musculoskeletal: No acute or significant osseous findings. Review of the MIP images confirms the above findings. IMPRESSION: CT chest: 1. Large left perihilar and medial upper lobe mass with mediastinal invasion, consistent with primary lung cancer. Metastases in the left lower lobe measuring up to 7.5 cm. 2. Left upper lobe lymphangitic carcinomatosis. Bulky mediastinal and left hilar nodal metastases. 3. Near complete occlusion of the central left upper and lower lobe bronchi. Left upper lobe postobstructive pneumonia. 4. No evidence of pulmonary embolism. Narrowing of the left pulmonary arteries due to tumor. 5. 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 CT abdomen pelvis: 1. No evidence of metastatic disease in the abdomen or pelvis. Electronically Signed   By: Titus Dubin M.D.   On: 11/11/2020 21:37   DG Chest Port 1 View  Result Date: 11/18/2020 CLINICAL DATA:  50 year old male with history of left lung mass and left pleural effusion. EXAM: PORTABLE CHEST - 1 VIEW COMPARISON:  11/11/2020 FINDINGS: The mediastinal contours partially obscured at the AP  window secondary to pulmonary mass. No evidence of cardiomegaly. Resolution of previously visualized small left pleural effusion. No evidence of pneumothorax. Similar appearing left mid lung solid mass measuring up to approximately 7.5 cm. 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 The right lung is clear. No acute osseous abnormality. IMPRESSION: 1. Resolution of left pleural effusion after left thoracentesis. No evidence of pneumothorax. 2. Similar appearing solid mass in the superior  segment left lower lobe, left hilar lymphadenopathy, and left upper lobe consolidative opacities. Ruthann Cancer, MD [facility] Rad[facility] Electronically Signed   By: Ruthann Cancer MD   On: 11/18/2020 15:20   ECHOCARDIOGRAM COMPLETE  Result Date: 11/29/2020    ECHOCARDIOGRAM REPORT   Patient Name:   Davarius Ridener. Date of Exam: 11/28/2020 Medical Rec #:  [id number]          Height:       68.0 in Accession #:    [id number]         Weight:       149.6 lb Date of Birth:  [birth year]           BSA:          1.806 m Patient Age:    56 years           BP:           115/80 mmHg Patient Gender: M                  HR:           106 bpm. Exam Location:  [facility] Procedure: 2D Echo, Cardiac Doppler and Color Doppler Indications:     Atrial Fibrillation I48.91  History:         Patient has no prior history of Echocardiogram examinations.  Sonographer:     Alyse Low Roar Referring Phys:  [place] Diagnosing Phys: Neoma Laming MD IMPRESSIONS  1. Left ventricular ejection fraction, by estimation, is 50 to 55%. The left ventricle has low normal function. The left ventricle demonstrates global hypokinesis. The left ventricular internal cavity size was mildly dilated. Left ventricular diastolic parameters are consistent with Grade I diastolic dysfunction (impaired relaxation).  2. Right ventricular systolic function is normal. The right ventricular size is moderately enlarged.  3. Left atrial size was moderately dilated.  4. Right atrial size was moderately dilated.  5. The mitral valve is normal in structure. Mild mitral valve regurgitation. No evidence of mitral stenosis.  6. The aortic valve is normal in structure. Aortic valve regurgitation is not visualized. No aortic stenosis is present.  7. The inferior vena cava is normal in size with greater than 50% respiratory variability, suggesting right atrial pressure of 3 mmHg. FINDINGS  Left Ventricle: Left ventricular ejection  fraction, by estimation, is 50 to 55%. The left ventricle has low normal function. The left ventricle demonstrates global hypokinesis. The left ventricular internal cavity size was mildly dilated. There is no left ventricular hypertrophy. Left ventricular diastolic parameters are consistent with Grade I diastolic dysfunction (impaired relaxation). Right Ventricle: The right ventricular size is moderately enlarged. No increase in right ventricular wall thickness. Right ventricular systolic function is normal. Left Atrium: Left atrial size was moderately dilated. Right Atrium: Right atrial size was moderately dilated. Pericardium: There is no evidence of pericardial effusion. Mitral Valve: The mitral valve is normal in structure. Mild mitral valve regurgitation. No evidence of  mitral valve stenosis. Tricuspid Valve: The tricuspid valve is normal in structure. Tricuspid valve regurgitation is mild . No evidence of tricuspid stenosis. Aortic Valve: The aortic valve is normal in structure. Aortic valve regurgitation is not visualized. No aortic stenosis is present. Aortic valve peak gradient measures 8.2 mmHg. Pulmonic Valve: The pulmonic valve was normal in structure. Pulmonic valve regurgitation is not visualized. No evidence of pulmonic stenosis. Aorta: The aortic root is normal in size and structure. Venous: The inferior vena cava is normal in size with greater than 50% respiratory variability, suggesting right atrial pressure of 3 mmHg. IAS/Shunts: No atrial level shunt detected by color flow Doppler.  LEFT VENTRICLE PLAX 2D LVIDd:         3.55 cm  Diastology LVIDs:         2.58 cm  LV e' medial:    10.00 cm/s LV PW:         0.97 cm  LV E/e' medial:  11.6 LV IVS:        0.99 cm  LV e' lateral:   12.50 cm/s LVOT diam:     1.80 cm  LV E/e' lateral: 9.3 LVOT Area:     2.54 cm  RIGHT VENTRICLE RV Mid diam:    2.66 cm RV S prime:     16.30 cm/s TAPSE (M-mode): 2.0 cm LEFT ATRIUM             Index       RIGHT ATRIUM            Index LA diam:        3.30 cm 1.83 cm/m  RA Area:     12.00 cm LA Vol (A2C):   35.4 ml 19.60 ml/m RA Volume:   25.10 ml  13.89 ml/m LA Vol (A4C):   21.9 ml 12.12 ml/m LA Biplane Vol: 29.1 ml 16.11 ml/m  AORTIC VALVE                PULMONIC VALVE AV Area (Vmax): 1.92 cm    PV Vmax:        0.96 m/s AV Vmax:        143.00 cm/s PV Peak grad:   3.7 mmHg AV Peak Grad:   8.2 mmHg    RVOT Peak grad: 2 mmHg LVOT Vmax:      108.00 cm/s  AORTA Ao Root diam: 2.40 cm MITRAL VALVE MV Area (PHT): 5.97 cm     SHUNTS MV Decel Time: 127 msec     Systemic Diam: 1.80 cm MV E velocity: 116.00 cm/s MV A velocity: 70.80 cm/s MV E/A ratio:  1.64 MV A Prime:    6.8 cm/s Neoma Laming MD Electronically signed by Neoma Laming MD Signature Date/Time: 11/29/2020/9:01:47 AM    Final    US THORACENTESIS ASP PLEURAL SPACE W/IMG GUIDE  Result Date: 11/18/2020 INDICATION: Patient with history of respiratory distress found to have a lung mass with a left-sided pleural effusion. Request is for therapeutic and diagnostic thoracentesis EXAM: ULTRASOUND GUIDED LEFT-SIDED THERAPEUTIC AND DIAGNOSTIC THORACENTESIS MEDICATIONS: Lidocaine 1% 10 mL COMPLICATIONS: None immediate. PROCEDURE: An ultrasound guided thoracentesis was thoroughly discussed with the patient and questions answered. The benefits, risks, alternatives and complications were also discussed. The patient understands and wishes to proceed with the procedure. Written consent was obtained. Ultrasound was performed to localize and mark an adequate pocket of fluid in the left chest. The area was then prepped and draped in the normal sterile fashion. 1% Lidocaine was used  for local anesthesia. Under ultrasound guidance a catheter was introduced. Thoracentesis was performed. The catheter was removed and a dressing applied. FINDINGS: A total of approximately 600 mL of dark amber fluid was removed. Samples were sent to the laboratory as requested by the clinical team. The pleural  effusion was noted to be loculated. IMPRESSION: Successful ultrasound guided left-sided therapeutic and diagnostic thoracentesis yielding 600 mL of pleural fluid. Read by: Rushie Nyhan, NP Electronically Signed   By: Ruthann Cancer MD   On: 11/18/2020 15:20    Labs:  CBC: Recent Labs    11/14/20 0534 11/25/20 1830 11/26/20 0927 11/28/20 0651  WBC 9.8 20.0* 22.8* 16.7*  HGB 14.2 13.5 12.7* 10.7*  HCT 41.8 39.9 37.4* 31.3*  PLT 380 287 264 251    COAGS: Recent Labs    11/25/20 2016  INR 1.1  APTT 38*    BMP: Recent Labs    11/14/20 0534 11/25/20 1830 11/26/20 0927 11/28/20 0651  NA 139 130* 131* 139  K 4.1 4.4 4.3 4.0  CL 104 93* 95* 103  CO2 27 26 23 26   GLUCOSE 90 121* 111* 205*  BUN 8 7 10 14   CALCIUM 8.8* 8.8* 8.7* 8.4*  CREATININE 0.54* 0.52* 0.59* 0.49*  GFRNONAA >60 >60 >60 >60    LIVER FUNCTION TESTS: Recent Labs    11/11/20 1702  BILITOT 0.5  AST 25  ALT 13  ALKPHOS 76  PROT 6.9  ALBUMIN 2.9*    TUMOR MARKERS: No results for input(s): AFPTM, CEA, CA199, CHROMGRNA in the last 8760 hours.  Assessment and Plan: Left lung masses. A.Fib w RVR, cardiology following Imaging reviewed. Plan for CT guided perc left lung mass biopsy. NPO p MN, hold Lovenox. Risks and benefits of CT guided lung nodule biopsy was discussed with the patient including, but not limited to bleeding, hemoptysis, respiratory failure requiring intubation, infection, pneumothorax requiring chest tube placement, stroke from air embolism or even death.  All of the patient's questions were answered and the patient is agreeable to proceed.  Consent signed and in chart.    Thank you for this interesting consult.  I greatly enjoyed meeting Jamieson Hetland. and look forward to participating in their care.  A copy of this report was sent to the requesting provider on this date.  Electronically Signed: Ascencion Dike, PA-C 11/29/2020, 2:53 PM   I spent a total of 20  minutes in face to face in clinical consultation, greater than 50% of which was counseling/coordinating care for lung mass bx

## 2020-11-29 NOTE — Consult Note (Signed)
Curtis Witter. is a 50 y.o. male  [id number]  Primary Cardiologist: Neoma Laming Reason for Consultation: Chest pain and shortness of breath  HPI: This is a 50 year old white male with history of asthma presented to the hospital with shortness of breath and chest pain was found to be in atrial fibrillation with rapid ventricular response rate about 150.  I was asked to evaluate the patient.  Patient was started on digoxin and Cardizem.  Ventricular rate still is 120.   Review of Systems: No orthopnea PND or leg swelling   Past Medical History:  Diagnosis Date  . Asthma     Medications Prior to Admission  Medication Sig Dispense Refill  . albuterol (PROVENTIL) (2.5 MG/3ML) 0.083% nebulizer solution Inhale 3 mLs into the lungs every 6 (six) hours as needed.    Marland Kitchen albuterol (VENTOLIN HFA) 108 (90 Base) MCG/ACT inhaler Inhale 1-2 puffs into the lungs every 4 (four) hours as needed for wheezing or shortness of breath. 1 each 2  . chlorpheniramine-HYDROcodone (TUSSIONEX) 10-8 MG/5ML SUER Take 5 mLs by mouth every 12 (twelve) hours as needed for cough.    . feeding supplement (ENSURE ENLIVE / ENSURE PLUS) LIQD Take 237 mLs by mouth 3 (three) times daily between meals. 237 mL 12  . ipratropium-albuterol (DUONEB) 0.5-2.5 (3) MG/3ML SOLN Take 3 mLs by nebulization every 6 (six) hours as needed. 75 mL 2  . omeprazole (PRILOSEC) 10 MG capsule Take 20 mg by mouth daily.    . cholecalciferol (VITAMIN D) 25 MCG tablet Take 1 tablet (1,000 Units total) by mouth daily. Can take any over-the-counter. (Patient not taking: Reported on 11/25/2020)    . Multiple Vitamin (MULTIVITAMIN WITH MINERALS) TABS tablet Take 1 tablet by mouth daily. (Patient not taking: Reported on 11/25/2020)    . predniSONE (DELTASONE) 20 MG tablet Take 40 mg (2 tablets) from 5/17 to 5/18, then 20 mg (1 tablet) from 5/19 to 5/22, then done. (Patient not taking: No sig reported) 8 tablet 0     . cholecalciferol  1,000 Units  Oral Daily  . dexamethasone  4 mg Oral BID  . dextromethorphan-guaiFENesin  1 tablet Oral BID  . digoxin  0.25 mg Oral Daily  . enoxaparin (LOVENOX) injection  40 mg Subcutaneous Q24H  . feeding supplement  237 mL Oral BID BM  . metoprolol tartrate  25 mg Oral Q6H  . multivitamin with minerals  1 tablet Oral Daily  . pantoprazole  40 mg Oral Daily    Infusions: . sodium chloride 500 mL (11/27/20 2043)  . ampicillin-sulbactam (UNASYN) IV 3 g (11/29/20 0429)  . azithromycin 500 mg (11/28/20 2026)    No Known Allergies  Social History   Socioeconomic History  . Marital status: Married    Spouse name: Not on file  . Number of children: Not on file  . Years of education: Not on file  . Highest education level: Not on file  Occupational History  . Not on file  Tobacco Use  . Smoking status: Current Every Day Smoker  . Smokeless tobacco: Never Used  Substance and Sexual Activity  . Alcohol use: Not on file  . Drug use: Not on file  . Sexual activity: Not on file  Other Topics Concern  . Not on file  Social History Narrative   Patient is a active smoker.;  Also liquor.  Lives with his wife in [place].  No children.  He works as a Public relations account executive of  [employer]   Financial Resource Strain: Not on file  Food Insecurity: Not on file  Transportation Needs: Not on file  Physical Activity: Not on file  Stress: Not on file  Social Connections: Not on file  Intimate Partner Violence: Not on file    Family History  Problem Relation Age of Onset  . High blood pressure Mother     PHYSICAL EXAM: Vitals:   11/29/20 0425 11/29/20 0732  BP: (!) 153/127 120/81  Pulse: (!) 134 (!) 128  Resp: 20 18  Temp: 98 F (36.7 C) 97.6 F (36.4 C)  SpO2: 94% 91%     Intake/Output Summary (Last 24 hours) at 11/29/2020 0850 Last data filed at 11/29/2020 0600 Gross per 24 hour  Intake 1572.89 ml  Output --  Net 1572.89 ml    General:  Well appearing. No respiratory  difficulty HEENT: normal Neck: supple. no JVD. Carotids 2+ bilat; no bruits. No lymphadenopathy or thryomegaly appreciated. Cor: PMI nondisplaced. Regular rate & rhythm. No rubs, gallops or murmurs. Lungs: clear Abdomen: soft, nontender, nondistended. No hepatosplenomegaly. No bruits or masses. Good bowel sounds. Extremities: no cyanosis, clubbing, rash, edema Neuro: alert & oriented x 3, cranial nerves grossly intact. moves all 4 extremities w/o difficulty. Affect pleasant.  ECG: Atrial fibrillation with rapid ventricular response rate  Results for orders placed or performed during the hospital encounter of 11/25/20 (from the past 24 hour(s))  T4, free     Status: None   Collection Time: 11/29/20  6:37 AM  Result Value Ref Range   Free T4 0.81 0.61 - 1.12 ng/dL   No results found.   ASSESSMENT AND PLAN: Atrial fibrillation with rapid ventricular response rate and lung mass.  Advise IV Cardizem or p.o. Cardizem along with digoxin and add over the higher dose from 25 every 6 to maybe 50 every 6.  Advise getting echocardiogram.  Nautika Cressey A

## 2020-11-29 NOTE — Progress Notes (Signed)
   11/28/20 1948  Assess: MEWS Score  Temp 98.6 F (37 C)  BP 128/81  Pulse Rate (!) 147  Resp 16  SpO2 95 %  O2 Device Room Air  Assess: MEWS Score  MEWS Temp 0  MEWS Systolic 0  MEWS Pulse 3  MEWS RR 0  MEWS LOC 0  MEWS Score 3  MEWS Score Color Yellow  Assess: if the MEWS score is Yellow or Red  Were vital signs taken at a resting state? Yes  Focused Assessment No change from prior assessment  Early Detection of Sepsis Score *See Row Information* Low  MEWS guidelines implemented *See Row Information* Yes  Notify: Charge Nurse/RN  Name of Charge Nurse/RN Notified Alisa, CN  Date Charge Nurse/RN Notified 11/28/20  Time Charge Nurse/RN Notified 1950

## 2020-11-29 NOTE — Progress Notes (Signed)
Pulmonary Medicine          Date: 11/29/2020,   MRN# [id number] Curtis Manning. [birth year]     Admission                  Current    Referring physician: Dr Billie Ruddy   CHIEF COMPLAINT:   Lung mass   [place]   Curtis Manning. is a 50 y.o. Caucasian male with medical history significant for asthma, ongoing tobacco abuse and GERD who presented to the emergency room with acute onset of significant worsening dyspnea with associated cough productive of clear sputum as well as wheezing.  The patient has been actually having symptoms for the last few weeks.  He was seen on an outpatient basis and was given 2 courses of antibiotics.  He had a chest x-ray that showed suspected lung mass and was supposed to be referred to pulmonology.  He admitted to weight loss of about 35 pounds over the last 2 to 3 months.  He has been having night sweats and fever.  No chest pain or palpitations.  No nausea or vomiting or abdominal pain.  No dysuria, regular hematuria or flank pain.  No bleeding diathesis.  Upon presentation to the ER rate was 136 with otherwise normal vital signs and later heart rate is 123.  Respiratory rate was initially normal and later 29.  Labs revealed hyponatremia 133 and albumin 2.9.  CBC showed minimal leukocytosis of 11.1 and thrombocytosis of 410.  Lactic acid was 1.3.  Influenza antigens and COVID-19 PCR came back negative.    He has been sick >1 month.  He is a smoker currently smokes trying to quit.   I reviewed CT chest independently.   I met with wife Curtis Manning and discussed findings.  Patient had additional questions which we discussed.   Patient has severe asthma, he was intubated and placed on mechanical ventilation at age 44.  He has been on prednisone recurrently.  He just finished prednisone taper  We discussed lung mass and biopsy.   11/14/20- patient is stable he is on room air.  He is able to be discharged and have additional workup done on  outpatient.  He has pulmonary workup done at [facility]. We discussed further workup needed including biopsy of lesion on CT chest. He was intubated at [facility] before and had workup there while in ICU and further workup after moving out to medical floor.  He has been seen there with pulmonary and has nebulizer prescribed for asthma.    11/28/20- patient is on room air. He has developed arrythmia.  He denies chest pain and smiling during interview.   11/29/20- patient is with tachycardia today 130s but remains asymptomatic on room air. He does have some chest discomfort with cough.  There is plan for biopsy in am of lung mass.    PAST MEDICAL HISTORY   Past Medical History:  Diagnosis Date  . Asthma      SURGICAL HISTORY   He never had surgery in the past  FAMILY HISTORY   Family History  Problem Relation Age of Onset  . High blood pressure Mother      SOCIAL HISTORY   Social History   Tobacco Use  . Smoking status: Current Every Day Smoker  . Smokeless tobacco: Never Used     MEDICATIONS    Home Medication:    Current Medication: No current facility-administered medications for this encounter. No current outpatient  medications on file.  Facility-Administered Medications Ordered in Other Encounters:  .  0.9 %  sodium chloride infusion, , Intravenous, PRN, Kathie Dike, MD, Last Rate: 10 mL/hr at 11/27/20 2043, 500 mL at 11/27/20 2043 .  Ampicillin-Sulbactam (UNASYN) 3 g in sodium chloride 0.9 % 100 mL IVPB, 3 g, Intravenous, Q6H, Memon, Jehanzeb, MD, Last Rate: 200 mL/hr at 11/29/20 1017, 3 g at 11/29/20 1017 .  azithromycin (ZITHROMAX) 500 mg in sodium chloride 0.9 % 250 mL IVPB, 500 mg, Intravenous, Q24H, Clarnce Flock, MD, Last Rate: 250 mL/hr at 11/28/20 2026, 500 mg at 11/28/20 2026 .  cholecalciferol (VITAMIN D3) tablet 1,000 Units, 1,000 Units, Oral, Daily, Kathie Dike, MD, 1,000 Units at 11/29/20 1000 .  dexamethasone (DECADRON) tablet 4 mg, 4 mg, Oral, BID,  Memon, Jehanzeb, MD, 4 mg at 11/29/20 1000 .  dextromethorphan-guaiFENesin (MUCINEX DM) 30-600 MG per 12 hr tablet 1 tablet, 1 tablet, Oral, BID, Blount, Xenia T, NP, 1 tablet at 11/29/20 1000 .  digoxin (LANOXIN) tablet 0.25 mg, 0.25 mg, Oral, Daily, Memon, Jehanzeb, MD, 0.25 mg at 11/29/20 1000 .  enoxaparin (LOVENOX) injection 40 mg, 40 mg, Subcutaneous, Q24H, Clarnce Flock, MD, 40 mg at 11/28/20 2018 .  feeding supplement (ENSURE ENLIVE / ENSURE PLUS) liquid 237 mL, 237 mL, Oral, BID BM, Memon, Jehanzeb, MD, 237 mL at 11/29/20 1001 .  ipratropium (ATROVENT) nebulizer solution 0.5 mg, 0.5 mg, Nebulization, Q6H PRN, Clarnce Flock, MD, 0.5 mg at 11/29/20 0132 .  levalbuterol (XOPENEX) nebulizer solution 0.63 mg, 0.63 mg, Nebulization, Q6H PRN, Kathie Dike, MD, 0.63 mg at 11/29/20 1000 .  metoprolol tartrate (LOPRESSOR) tablet 50 mg, 50 mg, Oral, Q6H, Neoma Laming A, MD, 50 mg at 11/29/20 1000 .  morphine 4 MG/ML injection 4 mg, 4 mg, Intravenous, Q4H PRN, Clarnce Flock, MD, 4 mg at 11/26/20 0725 .  multivitamin with minerals tablet 1 tablet, 1 tablet, Oral, Daily, Clarnce Flock, MD, 1 tablet at 11/29/20 1000 .  ondansetron (ZOFRAN) tablet 4 mg, 4 mg, Oral, Q6H PRN **OR** ondansetron (ZOFRAN) injection 4 mg, 4 mg, Intravenous, Q6H PRN, Clarnce Flock, MD, 4 mg at 11/26/20 0725 .  oxyCODONE-acetaminophen (PERCOCET/ROXICET) 5-325 MG per tablet 1-2 tablet, 1-2 tablet, Oral, Q4H PRN, Kathie Dike, MD, 2 tablet at 11/29/20 0959 .  pantoprazole (PROTONIX) EC tablet 40 mg, 40 mg, Oral, Daily, Clarnce Flock, MD, 40 mg at 11/29/20 1000 .  polyethylene glycol (MIRALAX / GLYCOLAX) packet 17 g, 17 g, Oral, Daily PRN, Clarnce Flock, MD .  traZODone (DESYREL) tablet 50 mg, 50 mg, Oral, QHS PRN, Clarnce Flock, MD, 50 mg at 11/27/20 2253    ALLERGIES   Patient has no known allergies.     REVIEW OF SYSTEMS    Review of Systems:  Gen:  Denies  fever, sweats,  chills weigh loss  HEENT: Denies blurred vision, double vision, ear pain, eye pain, hearing loss, nose bleeds, sore throat Cardiac:  No dizziness, chest pain or heaviness, chest tightness,edema Resp:   Denies cough or sputum porduction, shortness of breath,wheezing, hemoptysis,  Gi: Denies swallowing difficulty, stomach pain, nausea or vomiting, diarrhea, constipation, bowel incontinence Gu:  Denies bladder incontinence, burning urine Ext:   Denies Joint pain, stiffness or swelling Skin: Denies  skin rash, easy bruising or bleeding or hives Endoc:  Denies polyuria, polydipsia , polyphagia or weight change Psych:   Denies depression, insomnia or hallucinations   Other:  All other systems negative  VS: There were no vitals taken for this visit.     PHYSICAL EXAM    GENERAL:NAD, no fevers, chills, no weakness no fatigue HEAD: Normocephalic, atraumatic.  EYES: Pupils equal, round, reactive to light. Extraocular muscles intact. No scleral icterus.  MOUTH: Moist mucosal membrane. Dentition intact. No abscess noted.  EAR, NOSE, THROAT: Clear without exudates. No external lesions.  NECK: Supple. No thyromegaly. No nodules. No JVD.  PULMONARY: CTAb CARDIOVASCULAR: S1 and S2. Regular rate and rhythm. No murmurs, rubs, or gallops. No edema. Pedal pulses 2+ bilaterally.  GASTROINTESTINAL: Soft, nontender, nondistended. No masses. Positive bowel sounds. No hepatosplenomegaly.  MUSCULOSKELETAL: No swelling, clubbing, or edema. Range of motion full in all extremities.  NEUROLOGIC: Cranial nerves II through XII are intact. No gross focal neurological deficits. Sensation intact. Reflexes intact.  SKIN: No ulceration, lesions, rashes, or cyanosis. Skin warm and dry. Turgor intact.  PSYCHIATRIC: Mood, affect within normal limits. The patient is awake, alert and oriented x 3. Insight, judgment intact.       IMAGING    DG Chest 2 View  Result Date: 11/25/2020 CLINICAL DATA:  Chest pain and  shortness of breath EXAM: CHEST - 2 VIEW COMPARISON:  Chest radiograph 11/18/2020, chest CT 11/11/2020 FINDINGS: Unchanged cardiomediastinal silhouette. There is increased opacification of the left upper lung, with unchanged appearance of the left perihilar and left lower lobe mass. Small left pleural effusion. No visible pneumothorax. IMPRESSION: Increased opacification of the left upper lung with known left perihilar and left lower lobe masses, consistent with worsening postobstructive pneumonia. Small left pleural effusion. Electronically Signed   By: Maurine Simmering   On: 11/25/2020 19:04   DG Chest 2 View  Result Date: 11/11/2020 CLINICAL DATA:  Shortness of breath EXAM: CHEST - 2 VIEW COMPARISON:  None. FINDINGS: There is an area that appears masslike in the posterior segment of the left lower lobe measuring 6.8 x 6.5 x 6.4 cm. There is extensive airspace opacity throughout the left upper lobe as well. A small area of infiltrate is noted more inferiorly on the left. Right lung is clear. Heart size and pulmonary vascularity normal. Questionable adenopathy left hilum. No bone lesions. IMPRESSION: Suspected neoplastic focus in the superior segment left lower lobe measuring 6.8 x 6.5 x 6.4 cm. Apparent pneumonia throughout the left upper lobe. Ill-defined opacity left lower lobe may represent focal pneumonia, although on the frontal view, this area appears masslike and could represent a second potential neoplasm. This area on frontal view measures 3.7 x 3.1 cm. Advise chest CT for further assessment. Right lung clear. Heart size normal. Suspected left hilar adenopathy. Electronically Signed   By: Lowella Grip III M.D.   On: 11/11/2020 17:41   CT Angio Chest PE W and/or Wo Contrast  Result Date: 11/25/2020 CLINICAL DATA:  History of pneumonia left-sided chest pain EXAM: CT ANGIOGRAPHY CHEST WITH CONTRAST TECHNIQUE: Multidetector CT imaging of the chest was performed using the standard protocol during bolus  administration of intravenous contrast. Multiplanar CT image reconstructions and MIPs were obtained to evaluate the vascular anatomy. CONTRAST:  82m OMNIPAQUE IOHEXOL 350 MG/ML SOLN COMPARISON:  Chest x-ray 11/25/2020, CT chest 11/11/2020 FINDINGS: Cardiovascular: Satisfactory opacification of the pulmonary arteries to the segmental level. No evidence of pulmonary embolism. Encasement and diffuse narrowing of left-sided pulmonary vasculature secondary to lung mass. Normal cardiac size. Trace pericardial effusion. Mediastinum/Nodes: No thyroid mass. Mild narrowing of left mainstem bronchus with occlusion of left upper and lower lobe bronchi by tumor. Redemonstrated bulky mediastinal  and left hilar adenopathy. Index prevascular node measuring 2.1 cm, 2.2 cm previously. Conglomerate subcarinal and left hilar adenopathy versus tumor infiltration. Enlarged left cardio phrenic node up to 8 mm, previously 7 mm. Esophageal displacement to the right by mediastinal adenopathy/mass, esophagus cannot be clearly separated from bulky mediastinal mass. Distal esophageal nodes measuring up to 12 mm. Lungs/Pleura: Moderate partially loculated left pleural effusion as before. Large poorly defined medial left upper lobe and left hilar lung mass with mediastinal invasion and or poorly defined adenopathy. Large presumed metastatic mass mostly within the left lower lobe, measuring 8.7 by 5.8 cm, previously 7.5 x 4.1 cm. Subpleural anterior left lower lobe lung mass measures 2.9 x 1.8 cm, previously 2.6 x 1.2 cm, series 6, image 78. Extensive ground-glass density and patchy consolidation in the left upper lobe consistent with postobstructive pneumonia. Mild nodularity along the major fissure. Slightly worsened ground-glass density in the left lower lobe and surrounding the dominant left lower lobe lung mass. New or increased irregular nodularity in the left lateral lung base, series 6, image 77. Upper Abdomen: No acute abnormality.  Subcentimeter low-density lesions in the liver too small to further characterize. Musculoskeletal: No acute osseous abnormality Review of the MIP images confirms the above findings. IMPRESSION: 1. Negative for acute pulmonary embolus. Diffuse narrowing and encasement of left-sided pulmonary vasculature by large lung mass. 2. Large poorly defined left upper lobe and hilar lung mass with bulky mediastinal and left hilar metastatic disease. Possible mediastinal invasion by large left upper lobe lung mass. Occlusion of left upper and lower lobe bronchi by tumor. Metastatic lesions in the left lower lobe measuring up to 8.7 cm, increased compared to prior CT. Slight worsening of diffuse ground-glass density in the left lower lobe, likely worsening postobstructive pneumonia. Continued extensive consolidation and ground-glass densities in the left upper lobe, also consistent with postobstructive pneumonia. Redemonstrated fissural nodularity in the left upper lobe raising concern for metastatic disease. Moderate partially loculated left-sided pleural effusion. Electronically Signed   By: Donavan Foil M.D.   On: 11/25/2020 21:04   CT Angio Chest PE W and/or Wo Contrast  Result Date: 11/11/2020 CLINICAL DATA:  Lung mass. EXAM: CT ANGIOGRAPHY CHEST CT ABDOMEN AND PELVIS WITH CONTRAST TECHNIQUE: Multidetector CT imaging of the chest was performed using the standard protocol during bolus administration of intravenous contrast. Multiplanar CT image reconstructions and MIPs were obtained to evaluate the vascular anatomy. Multidetector CT imaging of the abdomen and pelvis was performed using the standard protocol during bolus administration of intravenous contrast. CONTRAST:  15m OMNIPAQUE IOHEXOL 350 MG/ML SOLN COMPARISON:  Chest x-ray from same day. FINDINGS: CTA CHEST FINDINGS Cardiovascular: Satisfactory opacification of the pulmonary arteries to the segmental level. No evidence of pulmonary embolism. Narrowing of the  left main, lobar, and segmental pulmonary arteries due to tumor. Normal heart size. No pericardial effusion. Mediastinum/Nodes: Bulky mediastinal left hilar lymphadenopathy. Index prevascular lymph node measures 2.2 cm in short axis. Conglomerate subcarinal and left hilar lymphadenopathy and/or tumor, difficult to measure. Lower paraesophageal lymph node measuring 9 mm in short axis (series 4, image 81). Prominent left cardiophrenic angle lymph node measuring 7 mm in short axis (series 4, image 96). No pathologically enlarged right hilar or axillary lymph nodes. The thyroid gland, trachea, and esophagus demonstrate no significant findings. Lungs/Pleura: Large left perihilar and medial upper lobe mass, difficult to measure, with near complete occlusion of the central left upper and lower lobe bronchi. 7.5 x 4.1 cm mass predominantly within the superior segment  of the left lower lobe, but crossing the major fissure. 1.2 x 2.6 cm subpleural nodule in the anterior left lower lobe (series 4, image 73). Patchy consolidation in the left upper lobe due to post obstructive pneumonia. Nodular interlobular septal thickening in the left upper lobe. Nodularity along the left major fissure superiorly. Small to moderate partially loculated left pleural effusion. The right lung is clear. No pneumothorax. Musculoskeletal: No chest wall abnormality. No acute or significant osseous findings. Review of the MIP images confirms the above findings. CT ABDOMEN AND PELVIS FINDINGS Hepatobiliary: Few scattered subcentimeter low-density lesions within the liver, too small to characterize. The gallbladder is unremarkable. No biliary dilatation. Pancreas: Unremarkable. No pancreatic ductal dilatation or surrounding inflammatory changes. Spleen: Normal in size without focal abnormality. Adrenals/Urinary Tract: Adrenal glands are unremarkable. 1.3 cm left renal simple cyst. No renal calculi or hydronephrosis. The bladder is under distended.  Stomach/Bowel: Stomach is within normal limits. Appendix appears normal. No evidence of bowel wall thickening, distention, or inflammatory changes. Vascular/Lymphatic: No significant vascular findings are present. No enlarged abdominal or pelvic lymph nodes. Reproductive: Prostate is unremarkable. Other: Small fat containing left inguinal hernia. No free fluid or pneumoperitoneum. Musculoskeletal: No acute or significant osseous findings. Review of the MIP images confirms the above findings. IMPRESSION: CT chest: 1. Large left perihilar and medial upper lobe mass with mediastinal invasion, consistent with primary lung cancer. Metastases in the left lower lobe measuring up to 7.5 cm. 2. Left upper lobe lymphangitic carcinomatosis. Bulky mediastinal and left hilar nodal metastases. 3. Near complete occlusion of the central left upper and lower lobe bronchi. Left upper lobe postobstructive pneumonia. 4. No evidence of pulmonary embolism. Narrowing of the left pulmonary arteries due to tumor. 5. 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 CT abdomen pelvis: 1. No evidence of metastatic disease in the abdomen or pelvis. Electronically Signed   By: Titus Dubin M.D.   On: 11/11/2020 21:37   CT ABDOMEN PELVIS W CONTRAST  Result Date: 11/11/2020 CLINICAL DATA:  Lung mass. EXAM: CT ANGIOGRAPHY CHEST CT ABDOMEN AND PELVIS WITH CONTRAST TECHNIQUE: Multidetector CT imaging of the chest was performed using the standard protocol during bolus administration of intravenous contrast. Multiplanar CT image reconstructions and MIPs were obtained to evaluate the vascular anatomy. Multidetector CT imaging of the abdomen and pelvis was performed using the standard protocol during bolus administration of intravenous contrast. CONTRAST:  30m OMNIPAQUE IOHEXOL 350 MG/ML SOLN COMPARISON:  Chest x-ray from same day. FINDINGS: CTA CHEST FINDINGS Cardiovascular: Satisfactory opacification of the pulmonary  arteries to the segmental level. No evidence of pulmonary embolism. Narrowing of the left main, lobar, and segmental pulmonary arteries due to tumor. Normal heart size. No pericardial effusion. Mediastinum/Nodes: Bulky mediastinal left hilar lymphadenopathy. Index prevascular lymph node measures 2.2 cm in short axis. Conglomerate subcarinal and left hilar lymphadenopathy and/or tumor, difficult to measure. Lower paraesophageal lymph node measuring 9 mm in short axis (series 4, image 81). Prominent left cardiophrenic angle lymph node measuring 7 mm in short axis (series 4, image 96). No pathologically enlarged right hilar or axillary lymph nodes. The thyroid gland, trachea, and esophagus demonstrate no significant findings. Lungs/Pleura: Large left perihilar and medial upper lobe mass, difficult to measure, with near complete occlusion of the central left upper and lower lobe bronchi. 7.5 x 4.1 cm mass predominantly within the superior segment of the left lower lobe, but crossing the major fissure. 1.2 x 2.6 cm subpleural nodule in the anterior left lower lobe (  series 4, image 73). Patchy consolidation in the left upper lobe due to post obstructive pneumonia. Nodular interlobular septal thickening in the left upper lobe. Nodularity along the left major fissure superiorly. Small to moderate partially loculated left pleural effusion. The right lung is clear. No pneumothorax. Musculoskeletal: No chest wall abnormality. No acute or significant osseous findings. Review of the MIP images confirms the above findings. CT ABDOMEN AND PELVIS FINDINGS Hepatobiliary: Few scattered subcentimeter low-density lesions within the liver, too small to characterize. The gallbladder is unremarkable. No biliary dilatation. Pancreas: Unremarkable. No pancreatic ductal dilatation or surrounding inflammatory changes. Spleen: Normal in size without focal abnormality. Adrenals/Urinary Tract: Adrenal glands are unremarkable. 1.3 cm left renal  simple cyst. No renal calculi or hydronephrosis. The bladder is under distended. Stomach/Bowel: Stomach is within normal limits. Appendix appears normal. No evidence of bowel wall thickening, distention, or inflammatory changes. Vascular/Lymphatic: No significant vascular findings are present. No enlarged abdominal or pelvic lymph nodes. Reproductive: Prostate is unremarkable. Other: Small fat containing left inguinal hernia. No free fluid or pneumoperitoneum. Musculoskeletal: No acute or significant osseous findings. Review of the MIP images confirms the above findings. IMPRESSION: CT chest: 1. Large left perihilar and medial upper lobe mass with mediastinal invasion, consistent with primary lung cancer. Metastases in the left lower lobe measuring up to 7.5 cm. 2. Left upper lobe lymphangitic carcinomatosis. Bulky mediastinal and left hilar nodal metastases. 3. Near complete occlusion of the central left upper and lower lobe bronchi. Left upper lobe postobstructive pneumonia. 4. No evidence of pulmonary embolism. Narrowing of the left pulmonary arteries due to tumor. 5. 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 CT abdomen pelvis: 1. No evidence of metastatic disease in the abdomen or pelvis. Electronically Signed   By: Titus Dubin M.D.   On: 11/11/2020 21:37   DG Chest Port 1 View  Result Date: 11/18/2020 CLINICAL DATA:  50 year old male with history of left lung mass and left pleural effusion. EXAM: PORTABLE CHEST - 1 VIEW COMPARISON:  11/11/2020 FINDINGS: The mediastinal contours partially obscured at the AP window secondary to pulmonary mass. No evidence of cardiomegaly. Resolution of previously visualized small left pleural effusion. No evidence of pneumothorax. Similar appearing left mid lung solid mass measuring up to approximately 7.5 cm. Similar appearing left upper lobe patchy opacities in keeping with previously described lymphangitic carcinomatosis in consolidation on  recent chest CT. The right lung is clear. No acute osseous abnormality. IMPRESSION: 1. Resolution of left pleural effusion after left thoracentesis. No evidence of pneumothorax. 2. Similar appearing solid mass in the superior segment left lower lobe, left hilar lymphadenopathy, and left upper lobe consolidative opacities. Ruthann Cancer, MD [facility] Electronically Signed   By: Ruthann Cancer MD   On: 11/18/2020 15:20   ECHOCARDIOGRAM COMPLETE  Result Date: 11/29/2020    ECHOCARDIOGRAM REPORT   Patient Name:   Curtis Manning. Date of Exam: 11/28/2020 Medical Rec #:  [id number]          Height:       68.0 in Accession #:    [id number]         Weight:       149.6 lb Date of Birth:  1971-05-18           BSA:          1.806 m Patient Age:    44 years           BP:  115/80 mmHg Patient Gender: M                  HR:           106 bpm. Exam Location:  [facility] Procedure: 2D Echo, Cardiac Doppler and Color Doppler Indications:     Atrial Fibrillation I48.91  History:         Patient has no prior history of Echocardiogram examinations.  Sonographer:     Alyse Low Roar Referring Phys:  [place] Diagnosing Phys: Neoma Laming MD IMPRESSIONS  1. Left ventricular ejection fraction, by estimation, is 50 to 55%. The left ventricle has low normal function. The left ventricle demonstrates global hypokinesis. The left ventricular internal cavity size was mildly dilated. Left ventricular diastolic parameters are consistent with Grade I diastolic dysfunction (impaired relaxation).  2. Right ventricular systolic function is normal. The right ventricular size is moderately enlarged.  3. Left atrial size was moderately dilated.  4. Right atrial size was moderately dilated.  5. The mitral valve is normal in structure. Mild mitral valve regurgitation. No evidence of mitral stenosis.  6. The aortic valve is normal in structure. Aortic valve regurgitation is not  visualized. No aortic stenosis is present.  7. The inferior vena cava is normal in size with greater than 50% respiratory variability, suggesting right atrial pressure of 3 mmHg. FINDINGS  Left Ventricle: Left ventricular ejection fraction, by estimation, is 50 to 55%. The left ventricle has low normal function. The left ventricle demonstrates global hypokinesis. The left ventricular internal cavity size was mildly dilated. There is no left ventricular hypertrophy. Left ventricular diastolic parameters are consistent with Grade I diastolic dysfunction (impaired relaxation). Right Ventricle: The right ventricular size is moderately enlarged. No increase in right ventricular wall thickness. Right ventricular systolic function is normal. Left Atrium: Left atrial size was moderately dilated. Right Atrium: Right atrial size was moderately dilated. Pericardium: There is no evidence of pericardial effusion. Mitral Valve: The mitral valve is normal in structure. Mild mitral valve regurgitation. No evidence of mitral valve stenosis. Tricuspid Valve: The tricuspid valve is normal in structure. Tricuspid valve regurgitation is mild . No evidence of tricuspid stenosis. Aortic Valve: The aortic valve is normal in structure. Aortic valve regurgitation is not visualized. No aortic stenosis is present. Aortic valve peak gradient measures 8.2 mmHg. Pulmonic Valve: The pulmonic valve was normal in structure. Pulmonic valve regurgitation is not visualized. No evidence of pulmonic stenosis. Aorta: The aortic root is normal in size and structure. Venous: The inferior vena cava is normal in size with greater than 50% respiratory variability, suggesting right atrial pressure of 3 mmHg. IAS/Shunts: No atrial level shunt detected by color flow Doppler.  LEFT VENTRICLE PLAX 2D LVIDd:         3.55 cm  Diastology LVIDs:         2.58 cm  LV e' medial:    10.00 cm/s LV PW:         0.97 cm  LV E/e' medial:  11.6 LV IVS:        0.99 cm  LV e'  lateral:   12.50 cm/s LVOT diam:     1.80 cm  LV E/e' lateral: 9.3 LVOT Area:     2.54 cm  RIGHT VENTRICLE RV Mid diam:    2.66 cm RV S prime:     16.30 cm/s TAPSE (M-mode): 2.0 cm LEFT ATRIUM             Index  RIGHT ATRIUM           Index LA diam:        3.30 cm 1.83 cm/m  RA Area:     12.00 cm LA Vol (A2C):   35.4 ml 19.60 ml/m RA Volume:   25.10 ml  13.89 ml/m LA Vol (A4C):   21.9 ml 12.12 ml/m LA Biplane Vol: 29.1 ml 16.11 ml/m  AORTIC VALVE                PULMONIC VALVE AV Area (Vmax): 1.92 cm    PV Vmax:        0.96 m/s AV Vmax:        143.00 cm/s PV Peak grad:   3.7 mmHg AV Peak Grad:   8.2 mmHg    RVOT Peak grad: 2 mmHg LVOT Vmax:      108.00 cm/s  AORTA Ao Root diam: 2.40 cm MITRAL VALVE MV Area (PHT): 5.97 cm     SHUNTS MV Decel Time: 127 msec     Systemic Diam: 1.80 cm MV E velocity: 116.00 cm/s MV A velocity: 70.80 cm/s MV E/A ratio:  1.64 MV A Prime:    6.8 cm/s Neoma Laming MD Electronically signed by Neoma Laming MD Signature Date/Time: 11/29/2020/9:01:47 AM    Final    US THORACENTESIS ASP PLEURAL SPACE W/IMG GUIDE  Result Date: 11/18/2020 INDICATION: Patient with history of respiratory distress found to have a lung mass with a left-sided pleural effusion. Request is for therapeutic and diagnostic thoracentesis EXAM: ULTRASOUND GUIDED LEFT-SIDED THERAPEUTIC AND DIAGNOSTIC THORACENTESIS MEDICATIONS: Lidocaine 1% 10 mL COMPLICATIONS: None immediate. PROCEDURE: An ultrasound guided thoracentesis was thoroughly discussed with the patient and questions answered. The benefits, risks, alternatives and complications were also discussed. The patient understands and wishes to proceed with the procedure. Written consent was obtained. Ultrasound was performed to localize and mark an adequate pocket of fluid in the left chest. The area was then prepped and draped in the normal sterile fashion. 1% Lidocaine was used for local anesthesia. Under ultrasound guidance a catheter was introduced.  Thoracentesis was performed. The catheter was removed and a dressing applied. FINDINGS: A total of approximately 600 mL of dark amber fluid was removed. Samples were sent to the laboratory as requested by the clinical team. The pleural effusion was noted to be loculated. IMPRESSION: Successful ultrasound guided left-sided therapeutic and diagnostic thoracentesis yielding 600 mL of pleural fluid. Read by: Rushie Nyhan, NP Electronically Signed   By: Ruthann Cancer MD   On: 11/18/2020 15:20      ASSESSMENT/PLAN   Left lung pneumonia with lung mass -patient is chronic smoker, has family history of gastric cancer in paternal grandmother.   -he has constitutional symptoms for appx 3 months -he has not had hemoptysis -he is on empiric therapy  -clinically improved -reviewed bronchoscopy   Probable COPD  - patient is on duoneb and steroids   - will continue prednisone 73m with taper   Left lung mass   - will perform infectious workup for fungal and atypical mycobacterial etiology  - cytology sputum stat  - discussed biopsy with patient he is agreeable.    Left pleural effusion   cytology negative    Thank you for allowing me to participate in the care of this patient.    Patient/Family are satisfied with care plan and all questions have been answered.  This document was prepared using Dragon voice recognition software and may include unintentional dictation errors.  Ottie Glazier, M.D.  Division of [facility]

## 2020-11-29 NOTE — Progress Notes (Signed)
PROGRESS NOTE    Curtis Manning.  CNO:[id number] DOB: 1971/01/05 DOA: 11/25/2020 PCP: System, Provider Not In    Brief Narrative:  50 year old male with history of COPD and tobacco use, recent diagnosis of large left lung mass that was diagnosed approximately 2 weeks ago.  He returns to the hospital with significant pain and shortness of breath in his chest.  CT scan was repeated that did not show PE, but showed enlarging lung mass.  He was admitted for further work-up.  Oncology and pulmonology consulted.   Assessment & Plan:   Active Problems:   Obstructive pneumonia   Mass of upper lobe of left lung   Pleural effusion on left   GERD (gastroesophageal reflux disease)   COPD (chronic obstructive pulmonary disease) (HCC)   Sinus tachycardia   Left lung mass, suspicious for malignancy -This was initially noted on CT scan approximately 2 weeks ago -Repeat CT scan during this admission shows slight enlargement -Mass appears to be compressing left pulmonary vasculature as well as occluding the left lower lobe and upper lobe bronchi -Appreciate oncology input -Seen by pulmonology and interventional radiology -Plan is for CT-guided biopsy on 6/1  Postobstructive pneumonia -Noted on CT -He does report some cough -Continue on Unasyn and azithromycin -He is also on Decadron  Left pleural effusion -Recent thoracentesis on 5/20 did not show any evidence of malignant cells -Possibly parapneumonic effusion -Fortunately, he does not appear to be short of breath and is on room air at this time  A. fib with RVR -CT chest negative for PE -Noted on EKG -Started on oral metoprolol every 6 hours -Heart rate remains elevated, blood pressure is on the lower side -Cardiology following -He is also on digoxin -We will consider Cardizem infusion if blood pressure will allow -TSH 0.28, FT4 0.81, T3 pending -Echocardiogram shows ejection fraction of 50 to 55% -CHA2DS2-VASc score of  0  COPD -He is not wheezing at this time -Continue bronchodilators as needed  GERD -Continue on PPI   DVT prophylaxis: enoxaparin (LOVENOX) injection 40 mg Start: 11/30/20 2200 SCDs Start: 11/25/20 2210  Code Status: full code Family Communication: discussed with patient Disposition Plan: Status is: Inpatient  Remains inpatient appropriate because:IV treatments appropriate due to intensity of illness or inability to take PO and Inpatient level of care appropriate due to severity of illness   Dispo: The patient is from: Home              Anticipated d/c is to: Home              Patient currently is not medically stable to d/c.   Difficult to place patient No   Consultants:   Oncology  Pulmonology  Cardiology  Interventional radiology  Procedures:     Antimicrobials:   unasyn 5/28>  Azithromycin 5/28>    Subjective: Reports that pain is better controlled than it was on admission.  Continues to have some productive cough.  Objective: Vitals:   11/29/20 0425 11/29/20 0732 11/29/20 1150 11/29/20 1542  BP: (!) 153/127 120/81 123/81 136/86  Pulse: (!) 134 (!) 128 (!) 129 (!) 152  Resp: 20 18 18 18   Temp: 98 F (36.7 C) 97.6 F (36.4 C) 98 F (36.7 C) 97.8 F (36.6 C)  TempSrc:  Oral Oral Oral  SpO2: 94% 91% 96% 94%  Weight:      Height:        Intake/Output Summary (Last 24 hours) at 11/29/2020 1853 Last data filed at 11/29/2020  1820 Gross per 24 hour  Intake 2330 ml  Output --  Net 2330 ml   Filed Weights   11/25/20 1829 11/26/20 1450 11/28/20 1300  Weight: 63.5 kg 63.3 kg 67.9 kg    Examination:  General exam: Alert, awake, oriented x 3 Respiratory system: Clear to auscultation. Respiratory effort normal. Cardiovascular system: Irregular. No murmurs, rubs, gallops. Gastrointestinal system: Abdomen is nondistended, soft and nontender. No organomegaly or masses felt. Normal bowel sounds heard. Central nervous system: Alert and oriented. No  focal neurological deficits. Extremities: No C/C/E, +pedal pulses Skin: No rashes, lesions or ulcers Psychiatry: Judgement and insight appear normal. Mood & affect appropriate.       Data Reviewed: I have personally reviewed following labs and imaging studies  CBC: Recent Labs  Lab 11/25/20 1830 11/26/20 0927 11/28/20 0651  WBC 20.0* 22.8* 16.7*  HGB 13.5 12.7* 10.7*  HCT 39.9 37.4* 31.3*  MCV 93.9 92.6 92.3  PLT 287 264 850   Basic Metabolic Panel: Recent Labs  Lab 11/25/20 1830 11/26/20 0927 11/28/20 0651  NA 130* 131* 139  K 4.4 4.3 4.0  CL 93* 95* 103  CO2 26 23 26   GLUCOSE 121* 111* 205*  BUN 7 10 14   CREATININE 0.52* 0.59* 0.49*  CALCIUM 8.8* 8.7* 8.4*   GFR: Estimated Creatinine Clearance: 106.1 mL/min (A) (by C-G formula based on SCr of 0.49 mg/dL (L)). Liver Function Tests: No results for input(s): AST, ALT, ALKPHOS, BILITOT, PROT, ALBUMIN in the last 168 hours. No results for input(s): LIPASE, AMYLASE in the last 168 hours. No results for input(s): AMMONIA in the last 168 hours. Coagulation Profile: Recent Labs  Lab 11/25/20 2016  INR 1.1   Cardiac Enzymes: No results for input(s): CKTOTAL, CKMB, CKMBINDEX, TROPONINI in the last 168 hours. BNP (last 3 results) No results for input(s): PROBNP in the last 8760 hours. HbA1C: No results for input(s): HGBA1C in the last 72 hours. CBG: No results for input(s): GLUCAP in the last 168 hours. Lipid Profile: No results for input(s): CHOL, HDL, LDLCALC, TRIG, CHOLHDL, LDLDIRECT in the last 72 hours. Thyroid Function Tests: Recent Labs    11/28/20 0651 11/29/20 0637  TSH 0.285*  --   FREET4  --  0.81   Anemia Panel: No results for input(s): VITAMINB12, FOLATE, FERRITIN, TIBC, IRON, RETICCTPCT in the last 72 hours. Sepsis Labs: Recent Labs  Lab 11/25/20 2016  LATICACIDVEN 1.2    Recent Results (from the past 240 hour(s))  Resp Panel by RT-PCR (Flu A&B, Covid) Nasopharyngeal Swab     Status:  None   Collection Time: 11/25/20  7:54 PM   Specimen: Nasopharyngeal Swab; Nasopharyngeal(NP) swabs in vial transport medium  Result Value Ref Range Status   SARS Coronavirus 2 by RT PCR NEGATIVE NEGATIVE Final    Comment: (NOTE) SARS-CoV-2 target nucleic acids are NOT DETECTED.  The SARS-CoV-2 RNA is generally detectable in upper respiratory specimens during the acute phase of infection. The lowest concentration of SARS-CoV-2 viral copies this assay can detect is 138 copies/mL. A negative result does not preclude SARS-Cov-2 infection and should not be used as the sole basis for treatment or other patient management decisions. A negative result may occur with  improper specimen collection/handling, submission of specimen other than nasopharyngeal swab, presence of viral mutation(s) within the areas targeted by this assay, and inadequate number of viral copies(<138 copies/mL). A negative result must be combined with clinical observations, patient history, and epidemiological information. The expected result is Negative.  Fact  Sheet for Patients:  EntrepreneurPulse.com.au  Fact Sheet for Healthcare Providers:  IncredibleEmployment.be  This test is no t yet approved or cleared by the [place] FDA and  has been authorized for detection and/or diagnosis of SARS-CoV-2 by FDA under an Emergency Use Authorization (EUA). This EUA will remain  in effect (meaning this test can be used) for the duration of the COVID-19 declaration under Section 564(b)(1) of the Act, 21 U.S.C.section 360bbb-3(b)(1), unless the authorization is terminated  or revoked sooner.       Influenza A by PCR NEGATIVE NEGATIVE Final   Influenza B by PCR NEGATIVE NEGATIVE Final    Comment: (NOTE) The Xpert Xpress SARS-CoV-2/FLU/RSV plus assay is intended as an aid in the diagnosis of influenza from Nasopharyngeal swab specimens and should not be used as a sole basis for  treatment. Nasal washings and aspirates are unacceptable for Xpert Xpress SARS-CoV-2/FLU/RSV testing.  Fact Sheet for Patients: EntrepreneurPulse.com.au  Fact Sheet for Healthcare Providers: IncredibleEmployment.be  This test is not yet approved or cleared by the [place] FDA and has been authorized for detection and/or diagnosis of SARS-CoV-2 by FDA under an Emergency Use Authorization (EUA). This EUA will remain in effect (meaning this test can be used) for the duration of the COVID-19 declaration under Section 564(b)(1) of the Act, 21 U.S.C. section 360bbb-3(b)(1), unless the authorization is terminated or revoked.  Performed at [facility], [address] [place], [place] [address]   Blood Culture (routine x 2)     Status: None (Preliminary result)   Collection Time: 11/25/20  8:17 PM   Specimen: BLOOD  Result Value Ref Range Status   Specimen Description BLOOD  LAC  Final   Special Requests   Final    BOTTLES DRAWN AEROBIC AND ANAEROBIC Blood Culture adequate volume   Culture   Final    NO GROWTH 4 DAYS Performed at [facility], [address] [place], [place] [address]    Report Status PENDING  Incomplete  Blood Culture (routine x 2)     Status: None (Preliminary result)   Collection Time: 11/25/20  8:17 PM   Specimen: BLOOD  Result Value Ref Range Status   Specimen Description BLOOD  RIGHT HAND  Final   Special Requests   Final    BOTTLES DRAWN AEROBIC AND ANAEROBIC Blood Culture results may not be optimal due to an inadequate volume of blood received in culture bottles   Culture   Final    NO GROWTH 4 DAYS Performed at [facility], [address], [place], [place] [address]    Report Status PENDING  Incomplete         Radiology Studies: ECHOCARDIOGRAM COMPLETE  Result Date: 11/29/2020    ECHOCARDIOGRAM REPORT   Patient Name:   Curtis Manning. Date of Exam: 11/28/2020 Medical Rec  #:  [id number]          Height:       68.0 in Accession #:    [id number]         Weight:       149.6 lb Date of Birth:  27-Sep-1970           BSA:          1.806 m Patient Age:    50 years           BP:           115/80 mmHg Patient Gender: M  HR:           106 bpm. Exam Location:  [facility] Procedure: 2D Echo, Cardiac Doppler and Color Doppler Indications:     Atrial Fibrillation I48.91  History:         Patient has no prior history of Echocardiogram examinations.  Sonographer:     Alyse Low Roar Referring Phys:  [place] Diagnosing Phys: Neoma Laming MD IMPRESSIONS  1. Left ventricular ejection fraction, by estimation, is 50 to 55%. The left ventricle has low normal function. The left ventricle demonstrates global hypokinesis. The left ventricular internal cavity size was mildly dilated. Left ventricular diastolic parameters are consistent with Grade I diastolic dysfunction (impaired relaxation).  2. Right ventricular systolic function is normal. The right ventricular size is moderately enlarged.  3. Left atrial size was moderately dilated.  4. Right atrial size was moderately dilated.  5. The mitral valve is normal in structure. Mild mitral valve regurgitation. No evidence of mitral stenosis.  6. The aortic valve is normal in structure. Aortic valve regurgitation is not visualized. No aortic stenosis is present.  7. The inferior vena cava is normal in size with greater than 50% respiratory variability, suggesting right atrial pressure of 3 mmHg. FINDINGS  Left Ventricle: Left ventricular ejection fraction, by estimation, is 50 to 55%. The left ventricle has low normal function. The left ventricle demonstrates global hypokinesis. The left ventricular internal cavity size was mildly dilated. There is no left ventricular hypertrophy. Left ventricular diastolic parameters are consistent with Grade I diastolic dysfunction (impaired relaxation). Right Ventricle: The right ventricular size is moderately  enlarged. No increase in right ventricular wall thickness. Right ventricular systolic function is normal. Left Atrium: Left atrial size was moderately dilated. Right Atrium: Right atrial size was moderately dilated. Pericardium: There is no evidence of pericardial effusion. Mitral Valve: The mitral valve is normal in structure. Mild mitral valve regurgitation. No evidence of mitral valve stenosis. Tricuspid Valve: The tricuspid valve is normal in structure. Tricuspid valve regurgitation is mild . No evidence of tricuspid stenosis. Aortic Valve: The aortic valve is normal in structure. Aortic valve regurgitation is not visualized. No aortic stenosis is present. Aortic valve peak gradient measures 8.2 mmHg. Pulmonic Valve: The pulmonic valve was normal in structure. Pulmonic valve regurgitation is not visualized. No evidence of pulmonic stenosis. Aorta: The aortic root is normal in size and structure. Venous: The inferior vena cava is normal in size with greater than 50% respiratory variability, suggesting right atrial pressure of 3 mmHg. IAS/Shunts: No atrial level shunt detected by color flow Doppler.  LEFT VENTRICLE PLAX 2D LVIDd:         3.55 cm  Diastology LVIDs:         2.58 cm  LV e' medial:    10.00 cm/s LV PW:         0.97 cm  LV E/e' medial:  11.6 LV IVS:        0.99 cm  LV e' lateral:   12.50 cm/s LVOT diam:     1.80 cm  LV E/e' lateral: 9.3 LVOT Area:     2.54 cm  RIGHT VENTRICLE RV Mid diam:    2.66 cm RV S prime:     16.30 cm/s TAPSE (M-mode): 2.0 cm LEFT ATRIUM             Index       RIGHT ATRIUM           Index LA diam:  3.30 cm 1.83 cm/m  RA Area:     12.00 cm LA Vol (A2C):   35.4 ml 19.60 ml/m RA Volume:   25.10 ml  13.89 ml/m LA Vol (A4C):   21.9 ml 12.12 ml/m LA Biplane Vol: 29.1 ml 16.11 ml/m  AORTIC VALVE                PULMONIC VALVE AV Area (Vmax): 1.92 cm    PV Vmax:        0.96 m/s AV Vmax:        143.00 cm/s PV Peak grad:   3.7 mmHg AV Peak Grad:   8.2 mmHg    RVOT Peak grad:  2 mmHg LVOT Vmax:      108.00 cm/s  AORTA Ao Root diam: 2.40 cm MITRAL VALVE MV Area (PHT): 5.97 cm     SHUNTS MV Decel Time: 127 msec     Systemic Diam: 1.80 cm MV E velocity: 116.00 cm/s MV A velocity: 70.80 cm/s MV E/A ratio:  1.64 MV A Prime:    6.8 cm/s Neoma Laming MD Electronically signed by Neoma Laming MD Signature Date/Time: 11/29/2020/9:01:47 AM    Final         Scheduled Meds: . cholecalciferol  1,000 Units Oral Daily  . dexamethasone  4 mg Oral BID  . dextromethorphan-guaiFENesin  1 tablet Oral BID  . digoxin  0.25 mg Oral Daily  . [START ON 11/30/2020] enoxaparin (LOVENOX) injection  40 mg Subcutaneous Q24H  . feeding supplement  237 mL Oral BID BM  . metoprolol tartrate  50 mg Oral Q6H  . multivitamin with minerals  1 tablet Oral Daily  . pantoprazole  40 mg Oral Daily   Continuous Infusions: . sodium chloride 500 mL (11/27/20 2043)  . ampicillin-sulbactam (UNASYN) IV 3 g (11/29/20 1515)  . azithromycin 500 mg (11/28/20 2026)     LOS: 4 days    Time spent: 17mins    Kathie Dike, MD [facility]   If 7PM-7AM, please contact night-coverage [email or web address]  11/29/2020, 6:53 PM

## 2020-11-29 NOTE — Progress Notes (Signed)
Pulmonary Medicine          Date: 11/29/2020,   MRN# [id number] Curtis Manning. 1970-07-27     AdmissionWeight: 63.5 kg                 CurrentWeight: 67.9 kg   Referring physician: Dr Billie Ruddy   CHIEF COMPLAINT:   Lung mass   HISTORY OF PRESENT ILLNESS   Curtis Manning. is a 50 y.o. Caucasian male with medical history significant for asthma, ongoing tobacco abuse and GERD who presented to the emergency room with acute onset of significant worsening dyspnea with associated cough productive of clear sputum as well as wheezing.  The patient has been actually having symptoms for the last few weeks.  He was seen on an outpatient basis and was given 2 courses of antibiotics.  He had a chest x-ray that showed suspected lung mass and was supposed to be referred to pulmonology.  He admitted to weight loss of about 35 pounds over the last 2 to 3 months.  He has been having night sweats and fever.  No chest pain or palpitations.  No nausea or vomiting or abdominal pain.  No dysuria, regular hematuria or flank pain.  No bleeding diathesis.  Upon presentation to the ER rate was 136 with otherwise normal vital signs and later heart rate is 123.  Respiratory rate was initially normal and later 29.  Labs revealed hyponatremia 133 and albumin 2.9.  CBC showed minimal leukocytosis of 11.1 and thrombocytosis of 410.  Lactic acid was 1.3.  Influenza antigens and COVID-19 PCR came back negative.    He has been sick >1 month.  He is a smoker currently smokes trying to quit.   I reviewed CT chest independently.   I met with wife Curtis Manning and discussed findings.  Patient had additional questions which we discussed.   Patient has severe asthma, he was intubated and placed on mechanical ventilation at age 33.  He has been on prednisone recurrently.  He just finished prednisone taper  We discussed lung mass and biopsy.   11/14/20- patient is stable he is on room air.  He is able to be discharged and  have additional workup done on outpatient.  He has pulmonary workup done at [facility]. We discussed further workup needed including biopsy of lesion on CT chest. He was intubated at [facility] before and had workup there while in ICU and further workup after moving out to medical floor.  He has been seen there with pulmonary and has nebulizer prescribed for asthma.    11/28/20- patient is on room air. He has developed arrythmia.  He denies chest pain and smiling during interview.   11/29/20-  Reviewed plan with medical team , patient for IR - CT guided lung mass bx in am . NPO tonight   PAST MEDICAL HISTORY   Past Medical History:  Diagnosis Date  . Asthma      SURGICAL HISTORY   He never had surgery in the past  FAMILY HISTORY   Family History  Problem Relation Age of Onset  . High blood pressure Mother      SOCIAL HISTORY   Social History   Tobacco Use  . Smoking status: Current Every Day Smoker  . Smokeless tobacco: Never Used     MEDICATIONS    Home Medication:    Current Medication:  Current Facility-Administered Medications:  .  0.9 %  sodium chloride infusion, , Intravenous, PRN, Kathie Dike, MD,  Last Rate: 10 mL/hr at 11/27/20 2043, 500 mL at 11/27/20 2043 .  Ampicillin-Sulbactam (UNASYN) 3 g in sodium chloride 0.9 % 100 mL IVPB, 3 g, Intravenous, Q6H, Memon, Jehanzeb, MD, Last Rate: 200 mL/hr at 11/29/20 0429, 3 g at 11/29/20 0429 .  azithromycin (ZITHROMAX) 500 mg in sodium chloride 0.9 % 250 mL IVPB, 500 mg, Intravenous, Q24H, Clarnce Flock, MD, Last Rate: 250 mL/hr at 11/28/20 2026, 500 mg at 11/28/20 2026 .  cholecalciferol (VITAMIN D3) tablet 1,000 Units, 1,000 Units, Oral, Daily, Kathie Dike, MD, 1,000 Units at 11/28/20 1200 .  dexamethasone (DECADRON) tablet 4 mg, 4 mg, Oral, BID, Kathie Dike, MD, 4 mg at 11/28/20 2018 .  dextromethorphan-guaiFENesin (MUCINEX DM) 30-600 MG per 12 hr tablet 1 tablet, 1 tablet, Oral, BID, Blount, Xenia T, NP, 1  tablet at 11/28/20 2018 .  digoxin (LANOXIN) tablet 0.25 mg, 0.25 mg, Oral, Daily, Memon, Jehanzeb, MD .  enoxaparin (LOVENOX) injection 40 mg, 40 mg, Subcutaneous, Q24H, Clarnce Flock, MD, 40 mg at 11/28/20 2018 .  feeding supplement (ENSURE ENLIVE / ENSURE PLUS) liquid 237 mL, 237 mL, Oral, BID BM, Kathie Dike, MD, 237 mL at 11/28/20 1211 .  ipratropium (ATROVENT) nebulizer solution 0.5 mg, 0.5 mg, Nebulization, Q6H PRN, Clarnce Flock, MD, 0.5 mg at 11/29/20 0132 .  levalbuterol (XOPENEX) nebulizer solution 0.63 mg, 0.63 mg, Nebulization, Q6H PRN, Kathie Dike, MD, 0.63 mg at 11/29/20 0132 .  metoprolol tartrate (LOPRESSOR) tablet 25 mg, 25 mg, Oral, Q6H, Memon, Jehanzeb, MD, 25 mg at 11/29/20 0430 .  morphine 4 MG/ML injection 4 mg, 4 mg, Intravenous, Q4H PRN, Clarnce Flock, MD, 4 mg at 11/26/20 0725 .  multivitamin with minerals tablet 1 tablet, 1 tablet, Oral, Daily, Clarnce Flock, MD, 1 tablet at 11/28/20 0840 .  ondansetron (ZOFRAN) tablet 4 mg, 4 mg, Oral, Q6H PRN **OR** ondansetron (ZOFRAN) injection 4 mg, 4 mg, Intravenous, Q6H PRN, Clarnce Flock, MD, 4 mg at 11/26/20 0725 .  oxyCODONE-acetaminophen (PERCOCET/ROXICET) 5-325 MG per tablet 1-2 tablet, 1-2 tablet, Oral, Q4H PRN, Kathie Dike, MD, 2 tablet at 11/29/20 0046 .  pantoprazole (PROTONIX) EC tablet 40 mg, 40 mg, Oral, Daily, Clarnce Flock, MD, 40 mg at 11/28/20 6213 .  polyethylene glycol (MIRALAX / GLYCOLAX) packet 17 g, 17 g, Oral, Daily PRN, Clarnce Flock, MD .  traZODone (DESYREL) tablet 50 mg, 50 mg, Oral, QHS PRN, Clarnce Flock, MD, 50 mg at 11/27/20 2253    ALLERGIES   Patient has no known allergies.     REVIEW OF SYSTEMS    Review of Systems:  Gen:  Denies  fever, sweats, chills weigh loss  HEENT: Denies blurred vision, double vision, ear pain, eye pain, hearing loss, nose bleeds, sore throat Cardiac:  No dizziness, chest pain or heaviness, chest  tightness,edema Resp:   Denies cough or sputum porduction, shortness of breath,wheezing, hemoptysis,  Gi: Denies swallowing difficulty, stomach pain, nausea or vomiting, diarrhea, constipation, bowel incontinence Gu:  Denies bladder incontinence, burning urine Ext:   Denies Joint pain, stiffness or swelling Skin: Denies  skin rash, easy bruising or bleeding or hives Endoc:  Denies polyuria, polydipsia , polyphagia or weight change Psych:   Denies depression, insomnia or hallucinations   Other:  All other systems negative   VS: BP 120/81 (BP Location: Left Arm)   Pulse (!) 128   Temp 97.6 F (36.4 C) (Oral)   Resp 18   Ht 5' 8"  (1.727 m)  Wt 67.9 kg   SpO2 91%   BMI 22.75 kg/m      PHYSICAL EXAM    GENERAL:NAD, no fevers, chills, no weakness no fatigue HEAD: Normocephalic, atraumatic.  EYES: Pupils equal, round, reactive to light. Extraocular muscles intact. No scleral icterus.  MOUTH: Moist mucosal membrane. Dentition intact. No abscess noted.  EAR, NOSE, THROAT: Clear without exudates. No external lesions.  NECK: Supple. No thyromegaly. No nodules. No JVD.  PULMONARY: CTAb CARDIOVASCULAR: S1 and S2. Regular rate and rhythm. No murmurs, rubs, or gallops. No edema. Pedal pulses 2+ bilaterally.  GASTROINTESTINAL: Soft, nontender, nondistended. No masses. Positive bowel sounds. No hepatosplenomegaly.  MUSCULOSKELETAL: No swelling, clubbing, or edema. Range of motion full in all extremities.  NEUROLOGIC: Cranial nerves II through XII are intact. No gross focal neurological deficits. Sensation intact. Reflexes intact.  SKIN: No ulceration, lesions, rashes, or cyanosis. Skin warm and dry. Turgor intact.  PSYCHIATRIC: Mood, affect within normal limits. The patient is awake, alert and oriented x 3. Insight, judgment intact.       IMAGING    DG Chest 2 View  Result Date: 11/25/2020 CLINICAL DATA:  Chest pain and shortness of breath EXAM: CHEST - 2 VIEW COMPARISON:  Chest  radiograph 11/18/2020, chest CT 11/11/2020 FINDINGS: Unchanged cardiomediastinal silhouette. There is increased opacification of the left upper lung, with unchanged appearance of the left perihilar and left lower lobe mass. Small left pleural effusion. No visible pneumothorax. IMPRESSION: Increased opacification of the left upper lung with known left perihilar and left lower lobe masses, consistent with worsening postobstructive pneumonia. Small left pleural effusion. Electronically Signed   By: Maurine Simmering   On: 11/25/2020 19:04   DG Chest 2 View  Result Date: 11/11/2020 CLINICAL DATA:  Shortness of breath EXAM: CHEST - 2 VIEW COMPARISON:  None. FINDINGS: There is an area that appears masslike in the posterior segment of the left lower lobe measuring 6.8 x 6.5 x 6.4 cm. There is extensive airspace opacity throughout the left upper lobe as well. A small area of infiltrate is noted more inferiorly on the left. Right lung is clear. Heart size and pulmonary vascularity normal. Questionable adenopathy left hilum. No bone lesions. IMPRESSION: Suspected neoplastic focus in the superior segment left lower lobe measuring 6.8 x 6.5 x 6.4 cm. Apparent pneumonia throughout the left upper lobe. Ill-defined opacity left lower lobe may represent focal pneumonia, although on the frontal view, this area appears masslike and could represent a second potential neoplasm. This area on frontal view measures 3.7 x 3.1 cm. Advise chest CT for further assessment. Right lung clear. Heart size normal. Suspected left hilar adenopathy. Electronically Signed   By: Lowella Grip III M.D.   On: 11/11/2020 17:41   CT Angio Chest PE W and/or Wo Contrast  Result Date: 11/25/2020 CLINICAL DATA:  History of pneumonia left-sided chest pain EXAM: CT ANGIOGRAPHY CHEST WITH CONTRAST TECHNIQUE: Multidetector CT imaging of the chest was performed using the standard protocol during bolus administration of intravenous contrast. Multiplanar CT  image reconstructions and MIPs were obtained to evaluate the vascular anatomy. CONTRAST:  57m OMNIPAQUE IOHEXOL 350 MG/ML SOLN COMPARISON:  Chest x-ray 11/25/2020, CT chest 11/11/2020 FINDINGS: Cardiovascular: Satisfactory opacification of the pulmonary arteries to the segmental level. No evidence of pulmonary embolism. Encasement and diffuse narrowing of left-sided pulmonary vasculature secondary to lung mass. Normal cardiac size. Trace pericardial effusion. Mediastinum/Nodes: No thyroid mass. Mild narrowing of left mainstem bronchus with occlusion of left upper and lower lobe bronchi by  tumor. Redemonstrated bulky mediastinal and left hilar adenopathy. Index prevascular node measuring 2.1 cm, 2.2 cm previously. Conglomerate subcarinal and left hilar adenopathy versus tumor infiltration. Enlarged left cardio phrenic node up to 8 mm, previously 7 mm. Esophageal displacement to the right by mediastinal adenopathy/mass, esophagus cannot be clearly separated from bulky mediastinal mass. Distal esophageal nodes measuring up to 12 mm. Lungs/Pleura: Moderate partially loculated left pleural effusion as before. Large poorly defined medial left upper lobe and left hilar lung mass with mediastinal invasion and or poorly defined adenopathy. Large presumed metastatic mass mostly within the left lower lobe, measuring 8.7 by 5.8 cm, previously 7.5 x 4.1 cm. Subpleural anterior left lower lobe lung mass measures 2.9 x 1.8 cm, previously 2.6 x 1.2 cm, series 6, image 78. Extensive ground-glass density and patchy consolidation in the left upper lobe consistent with postobstructive pneumonia. Mild nodularity along the major fissure. Slightly worsened ground-glass density in the left lower lobe and surrounding the dominant left lower lobe lung mass. New or increased irregular nodularity in the left lateral lung base, series 6, image 77. Upper Abdomen: No acute abnormality. Subcentimeter low-density lesions in the liver too small to  further characterize. Musculoskeletal: No acute osseous abnormality Review of the MIP images confirms the above findings. IMPRESSION: 1. Negative for acute pulmonary embolus. Diffuse narrowing and encasement of left-sided pulmonary vasculature by large lung mass. 2. Large poorly defined left upper lobe and hilar lung mass with bulky mediastinal and left hilar metastatic disease. Possible mediastinal invasion by large left upper lobe lung mass. Occlusion of left upper and lower lobe bronchi by tumor. Metastatic lesions in the left lower lobe measuring up to 8.7 cm, increased compared to prior CT. Slight worsening of diffuse ground-glass density in the left lower lobe, likely worsening postobstructive pneumonia. Continued extensive consolidation and ground-glass densities in the left upper lobe, also consistent with postobstructive pneumonia. Redemonstrated fissural nodularity in the left upper lobe raising concern for metastatic disease. Moderate partially loculated left-sided pleural effusion. Electronically Signed   By: Donavan Foil M.D.   On: 11/25/2020 21:04   CT Angio Chest PE W and/or Wo Contrast  Result Date: 11/11/2020 CLINICAL DATA:  Lung mass. EXAM: CT ANGIOGRAPHY CHEST CT ABDOMEN AND PELVIS WITH CONTRAST TECHNIQUE: Multidetector CT imaging of the chest was performed using the standard protocol during bolus administration of intravenous contrast. Multiplanar CT image reconstructions and MIPs were obtained to evaluate the vascular anatomy. Multidetector CT imaging of the abdomen and pelvis was performed using the standard protocol during bolus administration of intravenous contrast. CONTRAST:  38m OMNIPAQUE IOHEXOL 350 MG/ML SOLN COMPARISON:  Chest x-ray from same day. FINDINGS: CTA CHEST FINDINGS Cardiovascular: Satisfactory opacification of the pulmonary arteries to the segmental level. No evidence of pulmonary embolism. Narrowing of the left main, lobar, and segmental pulmonary arteries due to  tumor. Normal heart size. No pericardial effusion. Mediastinum/Nodes: Bulky mediastinal left hilar lymphadenopathy. Index prevascular lymph node measures 2.2 cm in short axis. Conglomerate subcarinal and left hilar lymphadenopathy and/or tumor, difficult to measure. Lower paraesophageal lymph node measuring 9 mm in short axis (series 4, image 81). Prominent left cardiophrenic angle lymph node measuring 7 mm in short axis (series 4, image 96). No pathologically enlarged right hilar or axillary lymph nodes. The thyroid gland, trachea, and esophagus demonstrate no significant findings. Lungs/Pleura: Large left perihilar and medial upper lobe mass, difficult to measure, with near complete occlusion of the central left upper and lower lobe bronchi. 7.5 x 4.1 cm mass predominantly  within the superior segment of the left lower lobe, but crossing the major fissure. 1.2 x 2.6 cm subpleural nodule in the anterior left lower lobe (series 4, image 73). Patchy consolidation in the left upper lobe due to post obstructive pneumonia. Nodular interlobular septal thickening in the left upper lobe. Nodularity along the left major fissure superiorly. Small to moderate partially loculated left pleural effusion. The right lung is clear. No pneumothorax. Musculoskeletal: No chest wall abnormality. No acute or significant osseous findings. Review of the MIP images confirms the above findings. CT ABDOMEN AND PELVIS FINDINGS Hepatobiliary: Few scattered subcentimeter low-density lesions within the liver, too small to characterize. The gallbladder is unremarkable. No biliary dilatation. Pancreas: Unremarkable. No pancreatic ductal dilatation or surrounding inflammatory changes. Spleen: Normal in size without focal abnormality. Adrenals/Urinary Tract: Adrenal glands are unremarkable. 1.3 cm left renal simple cyst. No renal calculi or hydronephrosis. The bladder is under distended. Stomach/Bowel: Stomach is within normal limits. Appendix  appears normal. No evidence of bowel wall thickening, distention, or inflammatory changes. Vascular/Lymphatic: No significant vascular findings are present. No enlarged abdominal or pelvic lymph nodes. Reproductive: Prostate is unremarkable. Other: Small fat containing left inguinal hernia. No free fluid or pneumoperitoneum. Musculoskeletal: No acute or significant osseous findings. Review of the MIP images confirms the above findings. IMPRESSION: CT chest: 1. Large left perihilar and medial upper lobe mass with mediastinal invasion, consistent with primary lung cancer. Metastases in the left lower lobe measuring up to 7.5 cm. 2. Left upper lobe lymphangitic carcinomatosis. Bulky mediastinal and left hilar nodal metastases. 3. Near complete occlusion of the central left upper and lower lobe bronchi. Left upper lobe postobstructive pneumonia. 4. No evidence of pulmonary embolism. Narrowing of the left pulmonary arteries due to tumor. 5. 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 CT abdomen pelvis: 1. No evidence of metastatic disease in the abdomen or pelvis. Electronically Signed   By: Titus Dubin M.D.   On: 11/11/2020 21:37   CT ABDOMEN PELVIS W CONTRAST  Result Date: 11/11/2020 CLINICAL DATA:  Lung mass. EXAM: CT ANGIOGRAPHY CHEST CT ABDOMEN AND PELVIS WITH CONTRAST TECHNIQUE: Multidetector CT imaging of the chest was performed using the standard protocol during bolus administration of intravenous contrast. Multiplanar CT image reconstructions and MIPs were obtained to evaluate the vascular anatomy. Multidetector CT imaging of the abdomen and pelvis was performed using the standard protocol during bolus administration of intravenous contrast. CONTRAST:  19m OMNIPAQUE IOHEXOL 350 MG/ML SOLN COMPARISON:  Chest x-ray from same day. FINDINGS: CTA CHEST FINDINGS Cardiovascular: Satisfactory opacification of the pulmonary arteries to the segmental level. No evidence of pulmonary  embolism. Narrowing of the left main, lobar, and segmental pulmonary arteries due to tumor. Normal heart size. No pericardial effusion. Mediastinum/Nodes: Bulky mediastinal left hilar lymphadenopathy. Index prevascular lymph node measures 2.2 cm in short axis. Conglomerate subcarinal and left hilar lymphadenopathy and/or tumor, difficult to measure. Lower paraesophageal lymph node measuring 9 mm in short axis (series 4, image 81). Prominent left cardiophrenic angle lymph node measuring 7 mm in short axis (series 4, image 96). No pathologically enlarged right hilar or axillary lymph nodes. The thyroid gland, trachea, and esophagus demonstrate no significant findings. Lungs/Pleura: Large left perihilar and medial upper lobe mass, difficult to measure, with near complete occlusion of the central left upper and lower lobe bronchi. 7.5 x 4.1 cm mass predominantly within the superior segment of the left lower lobe, but crossing the major fissure. 1.2 x 2.6 cm subpleural nodule in the  anterior left lower lobe (series 4, image 73). Patchy consolidation in the left upper lobe due to post obstructive pneumonia. Nodular interlobular septal thickening in the left upper lobe. Nodularity along the left major fissure superiorly. Small to moderate partially loculated left pleural effusion. The right lung is clear. No pneumothorax. Musculoskeletal: No chest wall abnormality. No acute or significant osseous findings. Review of the MIP images confirms the above findings. CT ABDOMEN AND PELVIS FINDINGS Hepatobiliary: Few scattered subcentimeter low-density lesions within the liver, too small to characterize. The gallbladder is unremarkable. No biliary dilatation. Pancreas: Unremarkable. No pancreatic ductal dilatation or surrounding inflammatory changes. Spleen: Normal in size without focal abnormality. Adrenals/Urinary Tract: Adrenal glands are unremarkable. 1.3 cm left renal simple cyst. No renal calculi or hydronephrosis. The  bladder is under distended. Stomach/Bowel: Stomach is within normal limits. Appendix appears normal. No evidence of bowel wall thickening, distention, or inflammatory changes. Vascular/Lymphatic: No significant vascular findings are present. No enlarged abdominal or pelvic lymph nodes. Reproductive: Prostate is unremarkable. Other: Small fat containing left inguinal hernia. No free fluid or pneumoperitoneum. Musculoskeletal: No acute or significant osseous findings. Review of the MIP images confirms the above findings. IMPRESSION: CT chest: 1. Large left perihilar and medial upper lobe mass with mediastinal invasion, consistent with primary lung cancer. Metastases in the left lower lobe measuring up to 7.5 cm. 2. Left upper lobe lymphangitic carcinomatosis. Bulky mediastinal and left hilar nodal metastases. 3. Near complete occlusion of the central left upper and lower lobe bronchi. Left upper lobe postobstructive pneumonia. 4. No evidence of pulmonary embolism. Narrowing of the left pulmonary arteries due to tumor. 5. 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 CT abdomen pelvis: 1. No evidence of metastatic disease in the abdomen or pelvis. Electronically Signed   By: Titus Dubin M.D.   On: 11/11/2020 21:37   DG Chest Port 1 View  Result Date: 11/18/2020 CLINICAL DATA:  50 year old male with history of left lung mass and left pleural effusion. EXAM: PORTABLE CHEST - 1 VIEW COMPARISON:  11/11/2020 FINDINGS: The mediastinal contours partially obscured at the AP window secondary to pulmonary mass. No evidence of cardiomegaly. Resolution of previously visualized small left pleural effusion. No evidence of pneumothorax. Similar appearing left mid lung solid mass measuring up to approximately 7.5 cm. 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 The right lung is clear. No acute  osseous abnormality. IMPRESSION: 1. Resolution of left pleural effusion after left thoracentesis. No evidence of pneumothorax. 2. Similar appearing solid mass in the superior segment left lower lobe, left hilar lymphadenopathy, and left upper lobe consolidative opacities. Ruthann Cancer, MD [facility] Electronically Signed   By: Ruthann Cancer MD   On: 11/18/2020 15:20   US THORACENTESIS ASP PLEURAL SPACE W/IMG GUIDE  Result Date: 11/18/2020 INDICATION: Patient with history of respiratory distress found to have a lung mass with a left-sided pleural effusion. Request is for therapeutic and diagnostic thoracentesis EXAM: ULTRASOUND GUIDED LEFT-SIDED THERAPEUTIC AND DIAGNOSTIC THORACENTESIS MEDICATIONS: Lidocaine 1% 10 mL COMPLICATIONS: None immediate. PROCEDURE: An ultrasound guided thoracentesis was thoroughly discussed with the patient and questions answered. The benefits, risks, alternatives and complications were also discussed. The patient understands and wishes to proceed with the procedure. Written consent was obtained. Ultrasound was performed to localize and mark an adequate pocket of fluid in the left chest. The area was then prepped and draped in the normal  sterile fashion. 1% Lidocaine was used for local anesthesia. Under ultrasound guidance a catheter was introduced. Thoracentesis was performed. The catheter was removed and a dressing applied. FINDINGS: A total of approximately 600 mL of dark amber fluid was removed. Samples were sent to the laboratory as requested by the clinical team. The pleural effusion was noted to be loculated. IMPRESSION: Successful ultrasound guided left-sided therapeutic and diagnostic thoracentesis yielding 600 mL of pleural fluid. Read by: Rushie Nyhan, NP Electronically Signed   By: Ruthann Cancer MD   On: 11/18/2020 15:20      ASSESSMENT/PLAN   Left lung pneumonia with lung mass -patient is chronic  smoker, has family history of gastric cancer in paternal grandmother.   -he has constitutional symptoms for appx 3 months -he has not had hemoptysis -he is on empiric therapy  -clinically improved -reviewed bronchoscopy   Probable COPD  - patient is on duoneb and steroids   - will continue prednisone 43m with taper   Left lung mass   - will perform infectious workup for fungal and atypical mycobacterial etiology  - cytology sputum stat  - discussed biopsy with patient he is agreeable.    Left pleural effusion   cytology negative    Thank you for allowing me to participate in the care of this patient.    Patient/Family are satisfied with care plan and all questions have been answered.  This document was prepared using Dragon voice recognition software and may include unintentional dictation errors.     FOttie Glazier M.D.  [facility]

## 2020-11-30 ENCOUNTER — Inpatient Hospital Stay: Payer: Medicaid Other

## 2020-11-30 DIAGNOSIS — I4892 Unspecified atrial flutter: Secondary | ICD-10-CM

## 2020-11-30 DIAGNOSIS — J95811 Postprocedural pneumothorax: Secondary | ICD-10-CM

## 2020-11-30 LAB — CULTURE, BLOOD (ROUTINE X 2)
Culture: NO GROWTH
Culture: NO GROWTH
Special Requests: ADEQUATE

## 2020-11-30 LAB — CBC
HCT: 36 % — ABNORMAL LOW (ref 39.0–52.0)
Hemoglobin: 12.2 g/dL — ABNORMAL LOW (ref 13.0–17.0)
MCH: 31.4 pg (ref 26.0–34.0)
MCHC: 33.9 g/dL (ref 30.0–36.0)
MCV: 92.8 fL (ref 80.0–100.0)
Platelets: 355 10*3/uL (ref 150–400)
RBC: 3.88 MIL/uL — ABNORMAL LOW (ref 4.22–5.81)
RDW: 13.9 % (ref 11.5–15.5)
WBC: 15.9 10*3/uL — ABNORMAL HIGH (ref 4.0–10.5)
nRBC: 0 % (ref 0.0–0.2)

## 2020-11-30 LAB — BASIC METABOLIC PANEL
Anion gap: 10 (ref 5–15)
BUN: 13 mg/dL (ref 6–20)
CO2: 29 mmol/L (ref 22–32)
Calcium: 8.8 mg/dL — ABNORMAL LOW (ref 8.9–10.3)
Chloride: 99 mmol/L (ref 98–111)
Creatinine, Ser: 0.6 mg/dL — ABNORMAL LOW (ref 0.61–1.24)
GFR, Estimated: >60 mL/min (ref >60)
Glucose, Bld: 118 mg/dL — ABNORMAL HIGH (ref 70–99)
Potassium: 4 mmol/L (ref 3.5–5.1)
Sodium: 138 mmol/L (ref 135–145)

## 2020-11-30 LAB — GLUCOSE, CAPILLARY: Glucose-Capillary: 191 mg/dL — ABNORMAL HIGH (ref 70–99)

## 2020-11-30 LAB — FUNGITELL, SERUM: Fungitell Result: <31 pg/mL (ref <80)

## 2020-11-30 LAB — T3: T3, Total: 94 ng/dL (ref 71–180)

## 2020-11-30 MED ORDER — DILTIAZEM HCL ER COATED BEADS 120 MG PO CP24
240.0000 mg | ORAL_CAPSULE | Freq: Every day | ORAL | Status: DC
  Administered 2020-11-30 – 2020-12-01 (×2): 240 mg via ORAL
  Filled 2020-11-30 (×2): qty 2

## 2020-11-30 MED ORDER — MIDAZOLAM HCL 2 MG/2ML IJ SOLN
INTRAMUSCULAR | Status: AC | PRN
  Administered 2020-11-30: 1 mg via INTRAVENOUS

## 2020-11-30 MED ORDER — FENTANYL CITRATE (PF) 100 MCG/2ML IJ SOLN
INTRAMUSCULAR | Status: AC | PRN
  Administered 2020-11-30 (×2): 50 ug via INTRAVENOUS

## 2020-11-30 MED ORDER — MIDAZOLAM HCL 2 MG/2ML IJ SOLN
INTRAMUSCULAR | Status: AC
  Filled 2020-11-30: qty 2

## 2020-11-30 MED ORDER — FENTANYL CITRATE (PF) 100 MCG/2ML IJ SOLN
INTRAMUSCULAR | Status: AC
  Filled 2020-11-30: qty 2

## 2020-11-30 NOTE — Procedures (Signed)
Interventional Radiology Procedure Note  Procedure: CT Guided Biopsy of LLL lung mass  Complications: Tiny PTX  Estimated Blood Loss: < 10 mL  Findings: 18 G core biopsy of LLL lung mass performed under CT guidance.  Three core samples obtained and sent to Pathology.  Will follow tiny left PTX with CXR during recovery.  Venetia Night. Kathlene Cote, M.D Pager:  [phone]

## 2020-11-30 NOTE — Progress Notes (Signed)
SUBJECTIVE: Patient denies any shortness of breath but has some wheezing   Vitals:   11/30/20 0500 11/30/20 0700 11/30/20 0739 11/30/20 0800  BP: 122/86  116/75   Pulse:   96   Resp: 19 19 17 20   Temp: 97.9 F (36.6 C)  97.9 F (36.6 C)   TempSrc: Oral  Oral   SpO2: 98%  94%   Weight:      Height:        Intake/Output Summary (Last 24 hours) at 11/30/2020 0903 Last data filed at 11/29/2020 1820 Gross per 24 hour  Intake 1420 ml  Output --  Net 1420 ml    LABS: Basic Metabolic Panel: Recent Labs    11/28/20 0651 11/30/20 0601  NA 139 138  K 4.0 4.0  CL 103 99  CO2 26 29  GLUCOSE 205* 118*  BUN 14 13  CREATININE 0.49* 0.60*  CALCIUM 8.4* 8.8*   Liver Function Tests: No results for input(s): AST, ALT, ALKPHOS, BILITOT, PROT, ALBUMIN in the last 72 hours. No results for input(s): LIPASE, AMYLASE in the last 72 hours. CBC: Recent Labs    11/28/20 0651 11/30/20 0601  WBC 16.7* 15.9*  HGB 10.7* 12.2*  HCT 31.3* 36.0*  MCV 92.3 92.8  PLT 251 355   Cardiac Enzymes: No results for input(s): CKTOTAL, CKMB, CKMBINDEX, TROPONINI in the last 72 hours. BNP: Invalid input(s): POCBNP D-Dimer: No results for input(s): DDIMER in the last 72 hours. Hemoglobin A1C: No results for input(s): HGBA1C in the last 72 hours. Fasting Lipid Panel: No results for input(s): CHOL, HDL, LDLCALC, TRIG, CHOLHDL, LDLDIRECT in the last 72 hours. Thyroid Function Tests: Recent Labs    11/28/20 0651  TSH 0.285*   Anemia Panel: No results for input(s): VITAMINB12, FOLATE, FERRITIN, TIBC, IRON, RETICCTPCT in the last 72 hours.   PHYSICAL EXAM General: Well developed, well nourished, in no acute distress HEENT:  Normocephalic and atramatic Neck:  No JVD.  Lungs: Clear bilaterally to auscultation and percussion. Heart: HRRR . Normal S1 and S2 without gallops or murmurs.  Abdomen: Bowel sounds are positive, abdomen soft and non-tender  Msk:  Back normal, normal gait. Normal strength  and tone for age. Extremities: No clubbing, cyanosis or edema.   Neuro: Alert and oriented X 3. Psych:  Good affect, responds appropriately  TELEMETRY: Atrial fibrillation with controlled rate  ASSESSMENT AND PLAN: Atrial fibrillation with ventricular rate much better continue this regimen of medications.  Patient can be seen as an outpatient in my office this Friday at 9 AM.  Active Problems:   Obstructive pneumonia   Mass of upper lobe of left lung   Pleural effusion on left   GERD (gastroesophageal reflux disease)   COPD (chronic obstructive pulmonary disease) ([place])   Sinus tachycardia    Ananda Sitzer A, MD, [facility] 11/30/2020 9:03 AM

## 2020-11-30 NOTE — Progress Notes (Signed)
PROGRESS NOTE    Curtis Manning.  KKX:[id number] DOB: [birth year] DOA: 11/25/2020 PCP: System, Provider Not In    Brief Narrative:  50 year old male with history of COPD and tobacco use, recent diagnosis of large left lung mass that was diagnosed approximately 2 weeks ago.  He returns to the hospital with significant pain and shortness of breath in his chest.  CT scan was repeated that did not show PE, but showed enlarging lung mass.  He was admitted for further work-up.  Oncology and pulmonology consulted. Patient underwent CT-guided lung biopsy with IR today-tolerated the procedure well.  Assessment & Plan:   Active Problems:   Obstructive pneumonia   Mass of upper lobe of left lung   Pleural effusion on left   GERD (gastroesophageal reflux disease)   COPD (chronic obstructive pulmonary disease) ([facility])   Sinus tachycardia   Left lung mass, suspicious for malignancy -This was initially noted on CT scan approximately 2 weeks ago -Repeat CT scan during this admission shows slight enlargement -Mass appears to be compressing left pulmonary vasculature as well as occluding the left lower lobe and upper lobe bronchi -Appreciate oncology input -Seen by pulmonology and interventional radiology -Patient had his lung biopsy earlier today with IR. -Need a close follow-up with oncology and pulmonology.  Postobstructive pneumonia -Noted on CT -He does report some cough -Continue on Unasyn and azithromycin -He is also on Decadron  Left pleural effusion -Recent thoracentesis on 5/20 did not show any evidence of malignant cells -Possibly parapneumonic effusion -Fortunately, he does not appear to be short of breath and is on room air at this time  A. fib with RVR -CT chest negative for PE -Noted on EKG -Started on oral metoprolol every 6 hours -He is also on digoxin -TSH 0.28, FT4 0.81, T3 94-all within normal limit -Echocardiogram shows ejection fraction of 50 to 55% -CHA2DS2-VASc  score of 0 -He was started on Cardizem infusion, heart rate today in high 90s to low 100s.  Discussed with his cardiologist and we will start him on Cardizem 240 mg daily, and he will have a close follow-up with his cardiologist for further recommendations. -Most likely be discharged tomorrow if heart rate remains stable  COPD -He is not wheezing at this time -Continue bronchodilators as needed  GERD -Continue on PPI   DVT prophylaxis: enoxaparin (LOVENOX) injection 40 mg Start: 11/30/20 2200 SCDs Start: 11/25/20 2210  Code Status: full code Family Communication: discussed with patient and mother at bedside Disposition Plan: Status is: Inpatient  Remains inpatient appropriate because:IV treatments appropriate due to intensity of illness or inability to take PO and Inpatient level of care appropriate due to severity of illness   Dispo: The patient is from: Home              Anticipated d/c is to: Home              Patient currently is not medically stable to d/c.   Difficult to place patient No   Consultants:   Oncology  Pulmonology  Cardiology  Interventional radiology  Procedures:   CT-guided lung biopsy  Antimicrobials:   unasyn 5/28>  Azithromycin 5/28>    Subjective: Patient was seen after the lung biopsy today.  Stating that he is slept through all the procedure and denies any complaints.  No chest pain or shortness of breath, heart rate remains in high 90s to low 100s.  Mother at bedside.  Objective: Vitals:   11/30/20 1025 11/30/20 1038 11/30/20  1045 11/30/20 1102  BP: 110/79 120/74 122/75 128/85  Pulse: 100 100 (!) 102 100  Resp: 12  20 20   Temp:    97.6 F (36.4 C)  TempSrc:    Oral  SpO2: 97% 96% 96% 97%  Weight:      Height:        Intake/Output Summary (Last 24 hours) at 11/30/2020 1409 Last data filed at 11/30/2020 1119 Gross per 24 hour  Intake 940 ml  Output --  Net 940 ml   Filed Weights   11/25/20 1829 11/26/20 1450 11/28/20 1300   Weight: 63.5 kg 63.3 kg 67.9 kg    Examination:  General.  Well-developed gentleman, in no acute distress. Pulmonary.  Few scattered rhonchi on left, normal respiratory effort. CV.  Regular rate and rhythm, no JVD, rub or murmur. Abdomen.  Soft, nontender, nondistended, BS positive. CNS.  Alert and oriented x3.  No focal neurologic deficit. Extremities.  No edema, no cyanosis, pulses intact and symmetrical. Psychiatry.  Judgment and insight appears normal.  Data Reviewed: I have personally reviewed following labs and imaging studies  CBC: Recent Labs  Lab 11/25/20 1830 11/26/20 0927 11/28/20 0651 11/30/20 0601  WBC 20.0* 22.8* 16.7* 15.9*  HGB 13.5 12.7* 10.7* 12.2*  HCT 39.9 37.4* 31.3* 36.0*  MCV 93.9 92.6 92.3 92.8  PLT 287 264 251 466   Basic Metabolic Panel: Recent Labs  Lab 11/25/20 1830 11/26/20 0927 11/28/20 0651 11/30/20 0601  NA 130* 131* 139 138  K 4.4 4.3 4.0 4.0  CL 93* 95* 103 99  CO2 26 23 26 29   GLUCOSE 121* 111* 205* 118*  BUN 7 10 14 13   CREATININE 0.52* 0.59* 0.49* 0.60*  CALCIUM 8.8* 8.7* 8.4* 8.8*   GFR: Estimated Creatinine Clearance: 106.1 mL/min (A) (by C-G formula based on SCr of 0.6 mg/dL (L)). Liver Function Tests: No results for input(s): AST, ALT, ALKPHOS, BILITOT, PROT, ALBUMIN in the last 168 hours. No results for input(s): LIPASE, AMYLASE in the last 168 hours. No results for input(s): AMMONIA in the last 168 hours. Coagulation Profile: Recent Labs  Lab 11/25/20 2016  INR 1.1   Cardiac Enzymes: No results for input(s): CKTOTAL, CKMB, CKMBINDEX, TROPONINI in the last 168 hours. BNP (last 3 results) No results for input(s): PROBNP in the last 8760 hours. HbA1C: No results for input(s): HGBA1C in the last 72 hours. CBG: No results for input(s): GLUCAP in the last 168 hours. Lipid Profile: No results for input(s): CHOL, HDL, LDLCALC, TRIG, CHOLHDL, LDLDIRECT in the last 72 hours. Thyroid Function Tests: Recent Labs     11/28/20 0651 11/29/20 0637  TSH 0.285*  --   FREET4  --  0.81   Anemia Panel: No results for input(s): VITAMINB12, FOLATE, FERRITIN, TIBC, IRON, RETICCTPCT in the last 72 hours. Sepsis Labs: Recent Labs  Lab 11/25/20 2016  LATICACIDVEN 1.2    Recent Results (from the past 240 hour(s))  Resp Panel by RT-PCR (Flu A&B, Covid) Nasopharyngeal Swab     Status: None   Collection Time: 11/25/20  7:54 PM   Specimen: Nasopharyngeal Swab; Nasopharyngeal(NP) swabs in vial transport medium  Result Value Ref Range Status   SARS Coronavirus 2 by RT PCR NEGATIVE NEGATIVE Final    Comment: (NOTE) SARS-CoV-2 target nucleic acids are NOT DETECTED.  The SARS-CoV-2 RNA is generally detectable in upper respiratory specimens during the acute phase of infection. The lowest concentration of SARS-CoV-2 viral copies this assay can detect is 138 copies/mL. A negative result  does not preclude SARS-Cov-2 infection and should not be used as the sole basis for treatment or other patient management decisions. A negative result may occur with  improper specimen collection/handling, submission of specimen other than nasopharyngeal swab, presence of viral mutation(s) within the areas targeted by this assay, and inadequate number of viral copies(<138 copies/mL). A negative result must be combined with clinical observations, patient history, and epidemiological information. The expected result is Negative.  Fact Sheet for Patients:  EntrepreneurPulse.com.au  Fact Sheet for Healthcare Providers:  IncredibleEmployment.be  This test is no t yet approved or cleared by the [place] FDA and  has been authorized for detection and/or diagnosis of SARS-CoV-2 by FDA under an Emergency Use Authorization (EUA). This EUA will remain  in effect (meaning this test can be used) for the duration of the COVID-19 declaration under Section 564(b)(1) of the Act, 21 U.S.C.section  360bbb-3(b)(1), unless the authorization is terminated  or revoked sooner.       Influenza A by PCR NEGATIVE NEGATIVE Final   Influenza B by PCR NEGATIVE NEGATIVE Final    Comment: (NOTE) The Xpert Xpress SARS-CoV-2/FLU/RSV plus assay is intended as an aid in the diagnosis of influenza from Nasopharyngeal swab specimens and should not be used as a sole basis for treatment. Nasal washings and aspirates are unacceptable for Xpert Xpress SARS-CoV-2/FLU/RSV testing.  Fact Sheet for Patients: EntrepreneurPulse.com.au  Fact Sheet for Healthcare Providers: IncredibleEmployment.be  This test is not yet approved or cleared by the [place] FDA and has been authorized for detection and/or diagnosis of SARS-CoV-2 by FDA under an Emergency Use Authorization (EUA). This EUA will remain in effect (meaning this test can be used) for the duration of the COVID-19 declaration under Section 564(b)(1) of the Act, 21 U.S.C. section 360bbb-3(b)(1), unless the authorization is terminated or revoked.  Performed at [facility], [address] [place], [place] [address]   Blood Culture (routine x 2)     Status: None   Collection Time: 11/25/20  8:17 PM   Specimen: BLOOD  Result Value Ref Range Status   Specimen Description BLOOD  LAC  Final   Special Requests   Final    BOTTLES DRAWN AEROBIC AND ANAEROBIC Blood Culture adequate volume   Culture   Final    NO GROWTH 5 DAYS Performed at [facility], [address] [place], [place] [address]    Report Status 11/30/2020 FINAL  Final  Blood Culture (routine x 2)     Status: None   Collection Time: 11/25/20  8:17 PM   Specimen: BLOOD  Result Value Ref Range Status   Specimen Description BLOOD  RIGHT HAND  Final   Special Requests   Final    BOTTLES DRAWN AEROBIC AND ANAEROBIC Blood Culture results may not be optimal due to an inadequate volume of blood received in culture bottles    Culture   Final    NO GROWTH 5 DAYS Performed at [facility], [address] [place], [place] [address]    Report Status 11/30/2020 FINAL  Final     Radiology Studies: [facility] Chest Port 1 View  Result Date: 11/30/2020 CLINICAL DATA:  Follow-up left pneumothorax after lung biopsy. EXAM: PORTABLE CHEST 1 VIEW COMPARISON:  CTA chest and chest x-ray dated Nov 25, 2020. FINDINGS: Normal heart size. Unchanged large left lung mass and postobstructive pneumonia. The right lung remains clear. Unchanged small left pleural effusion. Trace left pneumothorax seen on post biopsy CT is not readily  identified by x-ray. No acute osseous abnormality. IMPRESSION: 1. Trace left pneumothorax seen on post biopsy CT is not readily identified by x-ray. No enlarging pneumothorax. Electronically Signed   By: Titus Dubin M.D.   On: 11/30/2020 12:11   ECHOCARDIOGRAM COMPLETE  Result Date: 11/29/2020    ECHOCARDIOGRAM REPORT   Patient Name:   Curtis Manning. Date of Exam: 11/28/2020 Medical Rec #:  [id number]          Height:       68.0 in Accession #:    [id number]         Weight:       149.6 lb Date of Birth:  Sep 19, 1970           BSA:          1.806 m Patient Age:    71 years           BP:           115/80 mmHg Patient Gender: M                  HR:           106 bpm. Exam Location:  [facility] Procedure: 2D Echo, Cardiac Doppler and Color Doppler Indications:     Atrial Fibrillation I48.91  History:         Patient has no prior history of Echocardiogram examinations.  Sonographer:     Alyse Low Roar Referring Phys:  [place] Diagnosing Phys: Neoma Laming MD IMPRESSIONS  1. Left ventricular ejection fraction, by estimation, is 50 to 55%. The left ventricle has low normal function. The left ventricle demonstrates global hypokinesis. The left ventricular internal cavity size was mildly dilated. Left ventricular diastolic parameters are consistent with Grade I diastolic dysfunction (impaired relaxation).  2. Right  ventricular systolic function is normal. The right ventricular size is moderately enlarged.  3. Left atrial size was moderately dilated.  4. Right atrial size was moderately dilated.  5. The mitral valve is normal in structure. Mild mitral valve regurgitation. No evidence of mitral stenosis.  6. The aortic valve is normal in structure. Aortic valve regurgitation is not visualized. No aortic stenosis is present.  7. The inferior vena cava is normal in size with greater than 50% respiratory variability, suggesting right atrial pressure of 3 mmHg. FINDINGS  Left Ventricle: Left ventricular ejection fraction, by estimation, is 50 to 55%. The left ventricle has low normal function. The left ventricle demonstrates global hypokinesis. The left ventricular internal cavity size was mildly dilated. There is no left ventricular hypertrophy. Left ventricular diastolic parameters are consistent with Grade I diastolic dysfunction (impaired relaxation). Right Ventricle: The right ventricular size is moderately enlarged. No increase in right ventricular wall thickness. Right ventricular systolic function is normal. Left Atrium: Left atrial size was moderately dilated. Right Atrium: Right atrial size was moderately dilated. Pericardium: There is no evidence of pericardial effusion. Mitral Valve: The mitral valve is normal in structure. Mild mitral valve regurgitation. No evidence of mitral valve stenosis. Tricuspid Valve: The tricuspid valve is normal in structure. Tricuspid valve regurgitation is mild . No evidence of tricuspid stenosis. Aortic Valve: The aortic valve is normal in structure. Aortic valve regurgitation is not visualized. No aortic stenosis is present. Aortic valve peak gradient measures 8.2 mmHg. Pulmonic Valve: The pulmonic valve was normal in structure. Pulmonic valve regurgitation is not visualized. No evidence of pulmonic stenosis. Aorta: The aortic root is normal in size and structure. Venous: The inferior  vena  cava is normal in size with greater than 50% respiratory variability, suggesting right atrial pressure of 3 mmHg. IAS/Shunts: No atrial level shunt detected by color flow Doppler.  LEFT VENTRICLE PLAX 2D LVIDd:         3.55 cm  Diastology LVIDs:         2.58 cm  LV e' medial:    10.00 cm/s LV PW:         0.97 cm  LV E/e' medial:  11.6 LV IVS:        0.99 cm  LV e' lateral:   12.50 cm/s LVOT diam:     1.80 cm  LV E/e' lateral: 9.3 LVOT Area:     2.54 cm  RIGHT VENTRICLE RV Mid diam:    2.66 cm RV S prime:     16.30 cm/s TAPSE (M-mode): 2.0 cm LEFT ATRIUM             Index       RIGHT ATRIUM           Index LA diam:        3.30 cm 1.83 cm/m  RA Area:     12.00 cm LA Vol (A2C):   35.4 ml 19.60 ml/m RA Volume:   25.10 ml  13.89 ml/m LA Vol (A4C):   21.9 ml 12.12 ml/m LA Biplane Vol: 29.1 ml 16.11 ml/m  AORTIC VALVE                PULMONIC VALVE AV Area (Vmax): 1.92 cm    PV Vmax:        0.96 m/s AV Vmax:        143.00 cm/s PV Peak grad:   3.7 mmHg AV Peak Grad:   8.2 mmHg    RVOT Peak grad: 2 mmHg LVOT Vmax:      108.00 cm/s  AORTA Ao Root diam: 2.40 cm MITRAL VALVE MV Area (PHT): 5.97 cm     SHUNTS MV Decel Time: 127 msec     Systemic Diam: 1.80 cm MV E velocity: 116.00 cm/s MV A velocity: 70.80 cm/s MV E/A ratio:  1.64 MV A Prime:    6.8 cm/s Neoma Laming MD Electronically signed by Neoma Laming MD Signature Date/Time: 11/29/2020/9:01:47 AM    Final    CT LUNG MASS BIOPSY  Result Date: 11/30/2020 CLINICAL DATA:  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 The patient is not a candidate for bronchoscopy currently and request has been made for percutaneous biopsy of tumor in order to establish a diagnosis. EXAM: CT GUIDED CORE BIOPSY OF LEFT LOWER LOBE LUNG MASS ANESTHESIA/SEDATION: 1.0 mg IV Versed; 100 mcg IV Fentanyl Total Moderate Sedation Time:  19 minutes. The patient's level of consciousness and physiologic status were continuously  monitored during the procedure by Radiology nursing. PROCEDURE: The procedure risks, benefits, and alternatives were explained to the patient. Questions regarding the procedure were encouraged and answered. The patient understands and consents to the procedure. A time-out was performed prior to initiating the procedure. CT was performed through the chest with the left side rolled up in an oblique position. The left chest wall was prepped with chlorhexidine in a sterile fashion, and a sterile drape was applied covering the operative field. A sterile gown and sterile gloves were used for the procedure. Local anesthesia was provided with 1% Lidocaine. A 17 gauge trocar needle was advanced to the level  of a left lower lobe lung mass. After confirming needle tip position, 3 separate coaxial 18 gauge core biopsy samples were obtained and submitted in formalin. The BioSentry device was utilized in depositing a plug at the pleural entry site. Additional CT imaging was performed. COMPLICATIONS: Tiny left lateral pneumothorax. FINDINGS: The large mass of the left lower lobe was targeted for sampling measuring approximately 8.9 cm in greatest diameter. Solid tissue was obtained with core biopsy. After the procedure CT demonstrates a tiny left lateral pneumothorax. IMPRESSION: CT-guided core biopsy of 9 cm left lower lobe lung mass. The procedure was complicated by a tiny left lateral pneumothorax which will be followed during recovery by chest x-ray. Electronically Signed   By: Aletta Edouard M.D.   On: 11/30/2020 11:05    Scheduled Meds: . cholecalciferol  1,000 Units Oral Daily  . dexamethasone  4 mg Oral BID  . dextromethorphan-guaiFENesin  1 tablet Oral BID  . digoxin  0.25 mg Oral Daily  . enoxaparin (LOVENOX) injection  40 mg Subcutaneous Q24H  . feeding supplement  237 mL Oral BID BM  . metoprolol tartrate  50 mg Oral Q6H  . multivitamin with minerals  1 tablet Oral Daily  . pantoprazole  40 mg Oral Daily    Continuous Infusions: . sodium chloride 500 mL (11/27/20 2043)  . ampicillin-sulbactam (UNASYN) IV 3 g (11/30/20 0910)  . diltiazem (CARDIZEM) infusion 5 mg/hr (11/30/20 1059)     LOS: 5 days    Time spent: 30 mins  This record has been created using Systems analyst. Errors have been sought and corrected,but may not always be located. Such creation errors do not reflect on the standard of care.  Lorella Nimrod, MD [facility]   If 7PM-7AM, please contact night-coverage [email or web address]  11/30/2020, 2:09 PM

## 2020-11-30 NOTE — Progress Notes (Signed)
Patient clinically stable post CT Lung biopsy per Dr Kathlene Cote, tolerated well. Awake/alert and oriented post procedure. Vitals stable post procedure. Mother at bedside upon return to room. Report given to care nurse Lia Rn at bedside post procedure. Received Versed 1 mg along with Fentanyl 100 mcg IV for procedure.

## 2020-12-01 ENCOUNTER — Inpatient Hospital Stay: Payer: Medicaid Other

## 2020-12-01 ENCOUNTER — Other Ambulatory Visit: Payer: Self-pay

## 2020-12-01 ENCOUNTER — Telehealth: Payer: Self-pay | Admitting: Internal Medicine

## 2020-12-01 DIAGNOSIS — R918 Other nonspecific abnormal finding of lung field: Secondary | ICD-10-CM

## 2020-12-01 DIAGNOSIS — Z515 Encounter for palliative care: Secondary | ICD-10-CM

## 2020-12-01 DIAGNOSIS — Z7189 Other specified counseling: Secondary | ICD-10-CM

## 2020-12-01 DIAGNOSIS — I480 Paroxysmal atrial fibrillation: Secondary | ICD-10-CM

## 2020-12-01 DIAGNOSIS — R0602 Shortness of breath: Secondary | ICD-10-CM

## 2020-12-01 LAB — SURGICAL PATHOLOGY

## 2020-12-01 MED ORDER — DEXAMETHASONE 4 MG PO TABS: 4.0000 mg | ORAL_TABLET | Freq: Every day | ORAL | 0 refills | Status: AC

## 2020-12-01 MED ORDER — DILTIAZEM HCL ER COATED BEADS 240 MG PO CP24: 240.0000 mg | ORAL_CAPSULE | Freq: Every day | ORAL | 0 refills | Status: DC

## 2020-12-01 MED ORDER — METOPROLOL TARTRATE 50 MG PO TABS: 50.0000 mg | ORAL_TABLET | Freq: Four times a day (QID) | ORAL | 0 refills | Status: DC

## 2020-12-01 MED ORDER — DIGOXIN 250 MCG PO TABS: 0.2500 mg | ORAL_TABLET | Freq: Every day | ORAL | 0 refills | Status: DC

## 2020-12-01 NOTE — Plan of Care (Signed)

## 2020-12-01 NOTE — Discharge Summary (Signed)
Physician Discharge Summary  Curtis Manning. WUJ:[id number] DOB: [birth year] DOA: 11/25/2020  PCP: System, Provider Not In  Admit date: 11/25/2020 Discharge date: 12/01/2020  Admitted From: Home Disposition: Home  Recommendations for Outpatient Follow-up:  1. Follow up with PCP in 1-2 weeks 2. Follow-up with oncology 3. Follow-up with cardiology 4. Follow-up with pulmonology 5. Please obtain BMP/CBC in one week 6. Please follow up on the following pending results: Lung biopsy  Home Health: No Equipment/Devices: None Discharge Condition: Stable CODE STATUS: Full Diet recommendation: Heart Healthy   Brief/Interim Summary: 50 year old male with history of COPD and tobacco use, recent diagnosis of large left lung mass that was diagnosed approximately 2 weeks ago.  He returns to the hospital with significant pain and shortness of breath in his chest.  CT scan was repeated that did not show PE, but showed enlarging lung mass.  He was admitted for further work-up.  Oncology and pulmonology consulted. Patient underwent CT-guided lung biopsy with IR, developed a small pneumothorax after the biopsy which was resolved on subsequent repeat chest x-ray next morning.  Patient received 5 days of Unasyn for postobstructive pneumonia.  Remained afebrile.  Patient also has left pleural effusion s/p thoracentesis on 5/20 which did not show any malignant cells.  Imaging is very suspicious of lung malignancy patient needs to follow-up closely with oncology and pulmonology for further management.  Patient also developed new onset A. fib with RVR.  CTA was negative for PE.  Cardiology was consulted and he was started on digoxin along with metoprolol and Cardizem and he will follow-up closely with them for further management.  Echocardiogram with EF of 50 to 55%. CHA2DS2-VASc score of 0.  Patient was counseled extensively for smoking cessation and will need continuation of counseling by primary care  provider.  Patient will continue with current management and follow-up with his providers closely.  Discharge Diagnoses:  Active Problems:   Community acquired pneumonia of left lung   Lung mass   Pleural effusion on left   GERD (gastroesophageal reflux disease)   COPD (chronic obstructive pulmonary disease) (HCC)   Sinus tachycardia   Atrial flutter (HCC)   Pneumothorax of left lung after biopsy   Discharge Instructions  Discharge Instructions    Diet - low sodium heart healthy   Complete by: As directed    Discharge instructions   Complete by: As directed    It was pleasure taking care of you. It is very important that you follow up closely with your cardiologist, lung doctor and Oncologist for further management. Continue taking your meds as directed and your doctors can make changes as needed.   Increase activity slowly   Complete by: As directed      Allergies as of 12/01/2020   No Known Allergies     Medication List    STOP taking these medications   predniSONE 20 MG tablet Commonly known as: DELTASONE     TAKE these medications   albuterol (2.5 MG/3ML) 0.083% nebulizer solution Commonly known as: PROVENTIL Inhale 3 mLs into the lungs every 6 (six) hours as needed.   albuterol 108 (90 Base) MCG/ACT inhaler Commonly known as: VENTOLIN HFA Inhale 1-2 puffs into the lungs every 4 (four) hours as needed for wheezing or shortness of breath.   chlorpheniramine-HYDROcodone 10-8 MG/5ML Suer Commonly known as: TUSSIONEX Take 5 mLs by mouth every 12 (twelve) hours as needed for cough.   dexamethasone 4 MG tablet Commonly known as: DECADRON Take 1 tablet (4 mg  total) by mouth daily for 10 days.   digoxin 0.25 MG tablet Commonly known as: LANOXIN Take 1 tablet (0.25 mg total) by mouth daily.   diltiazem 240 MG 24 hr capsule Commonly known as: CARDIZEM CD Take 1 capsule (240 mg total) by mouth daily.   feeding supplement Liqd Take 237 mLs by mouth 3 (three)  times daily between meals.   ipratropium-albuterol 0.5-2.5 (3) MG/3ML Soln Commonly known as: DUONEB Take 3 mLs by nebulization every 6 (six) hours as needed.   metoprolol tartrate 50 MG tablet Commonly known as: LOPRESSOR Take 1 tablet (50 mg total) by mouth every 6 (six) hours.   multivitamin with minerals Tabs tablet Take 1 tablet by mouth daily.   omeprazole 10 MG capsule Commonly known as: PRILOSEC Take 20 mg by mouth daily.   Vitamin D3 25 MCG tablet Commonly known as: Vitamin D Take 1 tablet (1,000 Units total) by mouth daily. Can take any over-the-counter.       Follow-up Information    Erby Pian, MD. Schedule an appointment as soon as possible for a visit in 1 week(s).   Specialty: Specialist Contact information: [place] [address] [phone]        Dionisio David, MD. Daphane Shepherd on 12/02/2020.   Specialty: Cardiology Why: AT 9 AM Contact information: [place] [id number] [phone]              No Known Allergies  Consultations:  Cardiology  Pulmonology  Oncology  Procedures/Studies: DG Chest 2 View  Result Date: 12/01/2020 CLINICAL DATA:  Status post biopsy EXAM: CHEST - 2 VIEW COMPARISON:  November 30, 2020 FINDINGS: No pneumothorax is appreciable by radiography. There is a persistent residual pleural effusion on the left. Apparent masses in the left upper and left lower lobe regions with surrounding pneumonitis remains stable. Right lung is clear. Heart size and pulmonary vascularity normal. No adenopathy. No bone lesions. IMPRESSION: No pneumothorax evident by radiography. Mass lesions with surrounding pneumonitis on the left remain essentially stable with small left pleural effusion also noted. No new opacity evident. Right lung clear. Heart size normal. Electronically Signed   By: Lowella Grip III M.D.   On: 12/01/2020 09:37   DG Chest 2 View  Result Date: 11/25/2020 CLINICAL DATA:  Chest pain  and shortness of breath EXAM: CHEST - 2 VIEW COMPARISON:  Chest radiograph 11/18/2020, chest CT 11/11/2020 FINDINGS: Unchanged cardiomediastinal silhouette. There is increased opacification of the left upper lung, with unchanged appearance of the left perihilar and left lower lobe mass. Small left pleural effusion. No visible pneumothorax. IMPRESSION: Increased opacification of the left upper lung with known left perihilar and left lower lobe masses, consistent with worsening postobstructive pneumonia. Small left pleural effusion. Electronically Signed   By: Maurine Simmering   On: 11/25/2020 19:04   DG Chest 2 View  Result Date: 11/11/2020 CLINICAL DATA:  Shortness of breath EXAM: CHEST - 2 VIEW COMPARISON:  None. FINDINGS: There is an area that appears masslike in the posterior segment of the left lower lobe measuring 6.8 x 6.5 x 6.4 cm. There is extensive airspace opacity throughout the left upper lobe as well. A small area of infiltrate is noted more inferiorly on the left. Right lung is clear. Heart size and pulmonary vascularity normal. Questionable adenopathy left hilum. No bone lesions. IMPRESSION: Suspected neoplastic focus in the superior segment left lower lobe measuring 6.8 x 6.5 x 6.4 cm. Apparent pneumonia throughout the left  upper lobe. Ill-defined opacity left lower lobe may represent focal pneumonia, although on the frontal view, this area appears masslike and could represent a second potential neoplasm. This area on frontal view measures 3.7 x 3.1 cm. Advise chest CT for further assessment. Right lung clear. Heart size normal. Suspected left hilar adenopathy. Electronically Signed   By: Lowella Grip III M.D.   On: 11/11/2020 17:41   CT Angio Chest PE W and/or Wo Contrast  Result Date: 11/25/2020 CLINICAL DATA:  History of pneumonia left-sided chest pain EXAM: CT ANGIOGRAPHY CHEST WITH CONTRAST TECHNIQUE: Multidetector CT imaging of the chest was performed using the standard protocol during  bolus administration of intravenous contrast. Multiplanar CT image reconstructions and MIPs were obtained to evaluate the vascular anatomy. CONTRAST:  58mL OMNIPAQUE IOHEXOL 350 MG/ML SOLN COMPARISON:  Chest x-ray 11/25/2020, CT chest 11/11/2020 FINDINGS: Cardiovascular: Satisfactory opacification of the pulmonary arteries to the segmental level. No evidence of pulmonary embolism. Encasement and diffuse narrowing of left-sided pulmonary vasculature secondary to lung mass. Normal cardiac size. Trace pericardial effusion. Mediastinum/Nodes: No thyroid mass. Mild narrowing of left mainstem bronchus with occlusion of left upper and lower lobe bronchi by tumor. Redemonstrated bulky mediastinal and left hilar adenopathy. Index prevascular node measuring 2.1 cm, 2.2 cm previously. Conglomerate subcarinal and left hilar adenopathy versus tumor infiltration. Enlarged left cardio phrenic node up to 8 mm, previously 7 mm. Esophageal displacement to the right by mediastinal adenopathy/mass, esophagus cannot be clearly separated from bulky mediastinal mass. Distal esophageal nodes measuring up to 12 mm. Lungs/Pleura: Moderate partially loculated left pleural effusion as before. Large poorly defined medial left upper lobe and left hilar lung mass with mediastinal invasion and or poorly defined adenopathy. Large presumed metastatic mass mostly within the left lower lobe, measuring 8.7 by 5.8 cm, previously 7.5 x 4.1 cm. Subpleural anterior left lower lobe lung mass measures 2.9 x 1.8 cm, previously 2.6 x 1.2 cm, series 6, image 78. Extensive ground-glass density and patchy consolidation in the left upper lobe consistent with postobstructive pneumonia. Mild nodularity along the major fissure. Slightly worsened ground-glass density in the left lower lobe and surrounding the dominant left lower lobe lung mass. New or increased irregular nodularity in the left lateral lung base, series 6, image 77. Upper Abdomen: No acute abnormality.  Subcentimeter low-density lesions in the liver too small to further characterize. Musculoskeletal: No acute osseous abnormality Review of the MIP images confirms the above findings. IMPRESSION: 1. Negative for acute pulmonary embolus. Diffuse narrowing and encasement of left-sided pulmonary vasculature by large lung mass. 2. Large poorly defined left upper lobe and hilar lung mass with bulky mediastinal and left hilar metastatic disease. Possible mediastinal invasion by large left upper lobe lung mass. Occlusion of left upper and lower lobe bronchi by tumor. Metastatic lesions in the left lower lobe measuring up to 8.7 cm, increased compared to prior CT. Slight worsening of diffuse ground-glass density in the left lower lobe, likely worsening postobstructive pneumonia. Continued extensive consolidation and ground-glass densities in the left upper lobe, also consistent with postobstructive pneumonia. Redemonstrated fissural nodularity in the left upper lobe raising concern for metastatic disease. Moderate partially loculated left-sided pleural effusion. Electronically Signed   By: Donavan Foil M.D.   On: 11/25/2020 21:04   CT Angio Chest PE W and/or Wo Contrast  Result Date: 11/11/2020 CLINICAL DATA:  Lung mass. EXAM: CT ANGIOGRAPHY CHEST CT ABDOMEN AND PELVIS WITH CONTRAST TECHNIQUE: Multidetector CT imaging of the chest was performed using the standard protocol  during bolus administration of intravenous contrast. Multiplanar CT image reconstructions and MIPs were obtained to evaluate the vascular anatomy. Multidetector CT imaging of the abdomen and pelvis was performed using the standard protocol during bolus administration of intravenous contrast. CONTRAST:  61mL OMNIPAQUE IOHEXOL 350 MG/ML SOLN COMPARISON:  Chest x-ray from same day. FINDINGS: CTA CHEST FINDINGS Cardiovascular: Satisfactory opacification of the pulmonary arteries to the segmental level. No evidence of pulmonary embolism. Narrowing of the  left main, lobar, and segmental pulmonary arteries due to tumor. Normal heart size. No pericardial effusion. Mediastinum/Nodes: Bulky mediastinal left hilar lymphadenopathy. Index prevascular lymph node measures 2.2 cm in short axis. Conglomerate subcarinal and left hilar lymphadenopathy and/or tumor, difficult to measure. Lower paraesophageal lymph node measuring 9 mm in short axis (series 4, image 81). Prominent left cardiophrenic angle lymph node measuring 7 mm in short axis (series 4, image 96). No pathologically enlarged right hilar or axillary lymph nodes. The thyroid gland, trachea, and esophagus demonstrate no significant findings. Lungs/Pleura: Large left perihilar and medial upper lobe mass, difficult to measure, with near complete occlusion of the central left upper and lower lobe bronchi. 7.5 x 4.1 cm mass predominantly within the superior segment of the left lower lobe, but crossing the major fissure. 1.2 x 2.6 cm subpleural nodule in the anterior left lower lobe (series 4, image 73). Patchy consolidation in the left upper lobe due to post obstructive pneumonia. Nodular interlobular septal thickening in the left upper lobe. Nodularity along the left major fissure superiorly. Small to moderate partially loculated left pleural effusion. The right lung is clear. No pneumothorax. Musculoskeletal: No chest wall abnormality. No acute or significant osseous findings. Review of the MIP images confirms the above findings. CT ABDOMEN AND PELVIS FINDINGS Hepatobiliary: Few scattered subcentimeter low-density lesions within the liver, too small to characterize. The gallbladder is unremarkable. No biliary dilatation. Pancreas: Unremarkable. No pancreatic ductal dilatation or surrounding inflammatory changes. Spleen: Normal in size without focal abnormality. Adrenals/Urinary Tract: Adrenal glands are unremarkable. 1.3 cm left renal simple cyst. No renal calculi or hydronephrosis. The bladder is under distended.  Stomach/Bowel: Stomach is within normal limits. Appendix appears normal. No evidence of bowel wall thickening, distention, or inflammatory changes. Vascular/Lymphatic: No significant vascular findings are present. No enlarged abdominal or pelvic lymph nodes. Reproductive: Prostate is unremarkable. Other: Small fat containing left inguinal hernia. No free fluid or pneumoperitoneum. Musculoskeletal: No acute or significant osseous findings. Review of the MIP images confirms the above findings. IMPRESSION: CT chest: 1. Large left perihilar and medial upper lobe mass with mediastinal invasion, consistent with primary lung cancer. Metastases in the left lower lobe measuring up to 7.5 cm. 2. Left upper lobe lymphangitic carcinomatosis. Bulky mediastinal and left hilar nodal metastases. 3. Near complete occlusion of the central left upper and lower lobe bronchi. Left upper lobe postobstructive pneumonia. 4. No evidence of pulmonary embolism. Narrowing of the left pulmonary arteries due to tumor. 5. 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 CT abdomen pelvis: 1. No evidence of metastatic disease in the abdomen or pelvis. Electronically Signed   By: Titus Dubin M.D.   On: 11/11/2020 21:37   CT ABDOMEN PELVIS W CONTRAST  Result Date: 11/11/2020 CLINICAL DATA:  Lung mass. EXAM: CT ANGIOGRAPHY CHEST CT ABDOMEN AND PELVIS WITH CONTRAST TECHNIQUE: Multidetector CT imaging of the chest was performed using the standard protocol during bolus administration of intravenous contrast. Multiplanar CT image reconstructions and MIPs were obtained to evaluate the vascular anatomy. Multidetector CT imaging  of the abdomen and pelvis was performed using the standard protocol during bolus administration of intravenous contrast. CONTRAST:  80mL OMNIPAQUE IOHEXOL 350 MG/ML SOLN COMPARISON:  Chest x-ray from same day. FINDINGS: CTA CHEST FINDINGS Cardiovascular: Satisfactory opacification of the pulmonary  arteries to the segmental level. No evidence of pulmonary embolism. Narrowing of the left main, lobar, and segmental pulmonary arteries due to tumor. Normal heart size. No pericardial effusion. Mediastinum/Nodes: Bulky mediastinal left hilar lymphadenopathy. Index prevascular lymph node measures 2.2 cm in short axis. Conglomerate subcarinal and left hilar lymphadenopathy and/or tumor, difficult to measure. Lower paraesophageal lymph node measuring 9 mm in short axis (series 4, image 81). Prominent left cardiophrenic angle lymph node measuring 7 mm in short axis (series 4, image 96). No pathologically enlarged right hilar or axillary lymph nodes. The thyroid gland, trachea, and esophagus demonstrate no significant findings. Lungs/Pleura: Large left perihilar and medial upper lobe mass, difficult to measure, with near complete occlusion of the central left upper and lower lobe bronchi. 7.5 x 4.1 cm mass predominantly within the superior segment of the left lower lobe, but crossing the major fissure. 1.2 x 2.6 cm subpleural nodule in the anterior left lower lobe (series 4, image 73). Patchy consolidation in the left upper lobe due to post obstructive pneumonia. Nodular interlobular septal thickening in the left upper lobe. Nodularity along the left major fissure superiorly. Small to moderate partially loculated left pleural effusion. The right lung is clear. No pneumothorax. Musculoskeletal: No chest wall abnormality. No acute or significant osseous findings. Review of the MIP images confirms the above findings. CT ABDOMEN AND PELVIS FINDINGS Hepatobiliary: Few scattered subcentimeter low-density lesions within the liver, too small to characterize. The gallbladder is unremarkable. No biliary dilatation. Pancreas: Unremarkable. No pancreatic ductal dilatation or surrounding inflammatory changes. Spleen: Normal in size without focal abnormality. Adrenals/Urinary Tract: Adrenal glands are unremarkable. 1.3 cm left renal  simple cyst. No renal calculi or hydronephrosis. The bladder is under distended. Stomach/Bowel: Stomach is within normal limits. Appendix appears normal. No evidence of bowel wall thickening, distention, or inflammatory changes. Vascular/Lymphatic: No significant vascular findings are present. No enlarged abdominal or pelvic lymph nodes. Reproductive: Prostate is unremarkable. Other: Small fat containing left inguinal hernia. No free fluid or pneumoperitoneum. Musculoskeletal: No acute or significant osseous findings. Review of the MIP images confirms the above findings. IMPRESSION: CT chest: 1. Large left perihilar and medial upper lobe mass with mediastinal invasion, consistent with primary lung cancer. Metastases in the left lower lobe measuring up to 7.5 cm. 2. Left upper lobe lymphangitic carcinomatosis. Bulky mediastinal and left hilar nodal metastases. 3. Near complete occlusion of the central left upper and lower lobe bronchi. Left upper lobe postobstructive pneumonia. 4. No evidence of pulmonary embolism. Narrowing of the left pulmonary arteries due to tumor. 5. 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 CT abdomen pelvis: 1. No evidence of metastatic disease in the abdomen or pelvis. Electronically Signed   By: Titus Dubin M.D.   On: 11/11/2020 21:37   DG Chest Port 1 View  Result Date: 11/30/2020 CLINICAL DATA:  Follow-up left pneumothorax after lung biopsy. EXAM: PORTABLE CHEST 1 VIEW COMPARISON:  CTA chest and chest x-ray dated Nov 25, 2020. FINDINGS: Normal heart size. Unchanged large left lung mass and postobstructive pneumonia. The right lung remains clear. Unchanged small left pleural effusion. Trace left pneumothorax seen on post biopsy CT is not readily identified by x-ray. No acute osseous abnormality. IMPRESSION: 1. Trace left pneumothorax seen on  post biopsy CT is not readily identified by x-ray. No enlarging pneumothorax. Electronically Signed   By: Titus Dubin M.D.   On: 11/30/2020 12:11   DG Chest Port 1 View  Result Date: 11/18/2020 CLINICAL DATA:  50 year old male with history of left lung mass and left pleural effusion. EXAM: PORTABLE CHEST - 1 VIEW COMPARISON:  11/11/2020 FINDINGS: The mediastinal contours partially obscured at the AP window secondary to pulmonary mass. No evidence of cardiomegaly. Resolution of previously visualized small left pleural effusion. No evidence of pneumothorax. Similar appearing left mid lung solid mass measuring up to approximately 7.5 cm. 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 The right lung is clear. No acute osseous abnormality. IMPRESSION: 1. Resolution of left pleural effusion after left thoracentesis. No evidence of pneumothorax. 2. Similar appearing solid mass in the superior segment left lower lobe, left hilar lymphadenopathy, and left upper lobe consolidative opacities. Ruthann Cancer, MD [facility] Electronically Signed   By: Ruthann Cancer MD   On: 11/18/2020 15:20   ECHOCARDIOGRAM COMPLETE  Result Date: 11/29/2020    ECHOCARDIOGRAM REPORT   Patient Name:   Curtis Manning. Date of Exam: 11/28/2020 Medical Rec #:  [id number]          Height:       68.0 in Accession #:    [id number]         Weight:       149.6 lb Date of Birth:  September 20, 1970           BSA:          1.806 m Patient Age:    65 years           BP:           115/80 mmHg Patient Gender: M                  HR:           106 bpm. Exam Location:  [facility] Procedure: 2D Echo, Cardiac Doppler and Color Doppler Indications:     Atrial Fibrillation I48.91  History:         Patient has no prior history of Echocardiogram examinations.  Sonographer:     Alyse Low Roar Referring Phys:  [place] Diagnosing Phys: Neoma Laming MD IMPRESSIONS  1. Left ventricular ejection fraction, by estimation,  is 50 to 55%. The left ventricle has low normal function. The left ventricle demonstrates global hypokinesis. The left ventricular internal cavity size was mildly dilated. Left ventricular diastolic parameters are consistent with Grade I diastolic dysfunction (impaired relaxation).  2. Right ventricular systolic function is normal. The right ventricular size is moderately enlarged.  3. Left atrial size was moderately dilated.  4. Right atrial size was moderately dilated.  5. The mitral valve is normal in structure. Mild mitral valve regurgitation. No evidence of mitral stenosis.  6. The aortic valve is normal in structure. Aortic valve regurgitation is not visualized. No aortic stenosis is present.  7. The inferior vena cava is normal in size with greater than 50% respiratory variability, suggesting right atrial pressure of 3 mmHg. FINDINGS  Left Ventricle: Left ventricular ejection fraction, by estimation, is 50 to 55%. The left ventricle has low normal function. The left ventricle demonstrates global hypokinesis. The left ventricular internal cavity size was mildly dilated. There is no left ventricular hypertrophy. Left ventricular diastolic parameters are  consistent with Grade I diastolic dysfunction (impaired relaxation). Right Ventricle: The right ventricular size is moderately enlarged. No increase in right ventricular wall thickness. Right ventricular systolic function is normal. Left Atrium: Left atrial size was moderately dilated. Right Atrium: Right atrial size was moderately dilated. Pericardium: There is no evidence of pericardial effusion. Mitral Valve: The mitral valve is normal in structure. Mild mitral valve regurgitation. No evidence of mitral valve stenosis. Tricuspid Valve: The tricuspid valve is normal in structure. Tricuspid valve regurgitation is mild . No evidence of tricuspid stenosis. Aortic Valve: The aortic valve is normal in structure. Aortic valve regurgitation is not visualized. No  aortic stenosis is present. Aortic valve peak gradient measures 8.2 mmHg. Pulmonic Valve: The pulmonic valve was normal in structure. Pulmonic valve regurgitation is not visualized. No evidence of pulmonic stenosis. Aorta: The aortic root is normal in size and structure. Venous: The inferior vena cava is normal in size with greater than 50% respiratory variability, suggesting right atrial pressure of 3 mmHg. IAS/Shunts: No atrial level shunt detected by color flow Doppler.  LEFT VENTRICLE PLAX 2D LVIDd:         3.55 cm  Diastology LVIDs:         2.58 cm  LV e' medial:    10.00 cm/s LV PW:         0.97 cm  LV E/e' medial:  11.6 LV IVS:        0.99 cm  LV e' lateral:   12.50 cm/s LVOT diam:     1.80 cm  LV E/e' lateral: 9.3 LVOT Area:     2.54 cm  RIGHT VENTRICLE RV Mid diam:    2.66 cm RV S prime:     16.30 cm/s TAPSE (M-mode): 2.0 cm LEFT ATRIUM             Index       RIGHT ATRIUM           Index LA diam:        3.30 cm 1.83 cm/m  RA Area:     12.00 cm LA Vol (A2C):   35.4 ml 19.60 ml/m RA Volume:   25.10 ml  13.89 ml/m LA Vol (A4C):   21.9 ml 12.12 ml/m LA Biplane Vol: 29.1 ml 16.11 ml/m  AORTIC VALVE                PULMONIC VALVE AV Area (Vmax): 1.92 cm    PV Vmax:        0.96 m/s AV Vmax:        143.00 cm/s PV Peak grad:   3.7 mmHg AV Peak Grad:   8.2 mmHg    RVOT Peak grad: 2 mmHg LVOT Vmax:      108.00 cm/s  AORTA Ao Root diam: 2.40 cm MITRAL VALVE MV Area (PHT): 5.97 cm     SHUNTS MV Decel Time: 127 msec     Systemic Diam: 1.80 cm MV E velocity: 116.00 cm/s MV A velocity: 70.80 cm/s MV E/A ratio:  1.64 MV A Prime:    6.8 cm/s Neoma Laming MD Electronically signed by Neoma Laming MD Signature Date/Time: 11/29/2020/9:01:47 AM    Final    CT LUNG MASS BIOPSY  Result Date: 11/30/2020 CLINICAL DATA:  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 The patient is not a candidate for bronchoscopy currently and request has been made for  percutaneous biopsy of tumor  in order to establish a diagnosis. EXAM: CT GUIDED CORE BIOPSY OF LEFT LOWER LOBE LUNG MASS ANESTHESIA/SEDATION: 1.0 mg IV Versed; 100 mcg IV Fentanyl Total Moderate Sedation Time:  19 minutes. The patient's level of consciousness and physiologic status were continuously monitored during the procedure by Radiology nursing. PROCEDURE: The procedure risks, benefits, and alternatives were explained to the patient. Questions regarding the procedure were encouraged and answered. The patient understands and consents to the procedure. A time-out was performed prior to initiating the procedure. CT was performed through the chest with the left side rolled up in an oblique position. The left chest wall was prepped with chlorhexidine in a sterile fashion, and a sterile drape was applied covering the operative field. A sterile gown and sterile gloves were used for the procedure. Local anesthesia was provided with 1% Lidocaine. A 17 gauge trocar needle was advanced to the level of a left lower lobe lung mass. After confirming needle tip position, 3 separate coaxial 18 gauge core biopsy samples were obtained and submitted in formalin. The BioSentry device was utilized in depositing a plug at the pleural entry site. Additional CT imaging was performed. COMPLICATIONS: Tiny left lateral pneumothorax. FINDINGS: The large mass of the left lower lobe was targeted for sampling measuring approximately 8.9 cm in greatest diameter. Solid tissue was obtained with core biopsy. After the procedure CT demonstrates a tiny left lateral pneumothorax. IMPRESSION: CT-guided core biopsy of 9 cm left lower lobe lung mass. The procedure was complicated by a tiny left lateral pneumothorax which will be followed during recovery by chest x-ray. Electronically Signed   By: Aletta Edouard M.D.   On: 11/30/2020 11:05   US THORACENTESIS ASP PLEURAL SPACE W/IMG GUIDE  Result Date: 11/18/2020 INDICATION: Patient with history  of respiratory distress found to have a lung mass with a left-sided pleural effusion. Request is for therapeutic and diagnostic thoracentesis EXAM: ULTRASOUND GUIDED LEFT-SIDED THERAPEUTIC AND DIAGNOSTIC THORACENTESIS MEDICATIONS: Lidocaine 1% 10 mL COMPLICATIONS: None immediate. PROCEDURE: An ultrasound guided thoracentesis was thoroughly discussed with the patient and questions answered. The benefits, risks, alternatives and complications were also discussed. The patient understands and wishes to proceed with the procedure. Written consent was obtained. Ultrasound was performed to localize and mark an adequate pocket of fluid in the left chest. The area was then prepped and draped in the normal sterile fashion. 1% Lidocaine was used for local anesthesia. Under ultrasound guidance a catheter was introduced. Thoracentesis was performed. The catheter was removed and a dressing applied. FINDINGS: A total of approximately 600 mL of dark amber fluid was removed. Samples were sent to the laboratory as requested by the clinical team. The pleural effusion was noted to be loculated. IMPRESSION: Successful ultrasound guided left-sided therapeutic and diagnostic thoracentesis yielding 600 mL of pleural fluid. Read by: Rushie Nyhan, NP Electronically Signed   By: Ruthann Cancer MD   On: 11/18/2020 15:20     Subjective: Patient was seen and examined today.  No new complaint.  Wants to go home.  Discharge Exam: Vitals:   12/01/20 0448 12/01/20 1023  BP: (!) 120/96 100/71  Pulse: 75 84  Resp: 18 18  Temp: 97.6 F (36.4 C) 98 F (36.7 C)  SpO2: 98% 96%   Vitals:   11/30/20 1554 11/30/20 2004 12/01/20 0448 12/01/20 1023  BP: 117/66 107/89 (!) 120/96 100/71  Pulse: 99 61 75 84  Resp: 17 18 18 18   Temp: 97.7 F (36.5 C) 97.9 F (36.6 C) 97.6 F (36.4 C)  1 F (36.7 C)  TempSrc: Oral     SpO2: 93% 96% 98% 96%  Weight:    66.5 kg  Height:        General: Pt is alert, awake, not in acute  distress Cardiovascular: RRR, S1/S2 +, no rubs, no gallops Respiratory: Few scattered dry crackles more prominent on left Abdominal: Soft, NT, ND, bowel sounds + Extremities: no edema, no cyanosis   The results of significant diagnostics from this hospitalization (including imaging, microbiology, ancillary and laboratory) are listed below for reference.    Microbiology: Recent Results (from the past 240 hour(s))  Resp Panel by RT-PCR (Flu A&B, Covid) Nasopharyngeal Swab     Status: None   Collection Time: 11/25/20  7:54 PM   Specimen: Nasopharyngeal Swab; Nasopharyngeal(NP) swabs in vial transport medium  Result Value Ref Range Status   SARS Coronavirus 2 by RT PCR NEGATIVE NEGATIVE Final    Comment: (NOTE) SARS-CoV-2 target nucleic acids are NOT DETECTED.  The SARS-CoV-2 RNA is generally detectable in upper respiratory specimens during the acute phase of infection. The lowest concentration of SARS-CoV-2 viral copies this assay can detect is 138 copies/mL. A negative result does not preclude SARS-Cov-2 infection and should not be used as the sole basis for treatment or other patient management decisions. A negative result may occur with  improper specimen collection/handling, submission of specimen other than nasopharyngeal swab, presence of viral mutation(s) within the areas targeted by this assay, and inadequate number of viral copies(<138 copies/mL). A negative result must be combined with clinical observations, patient history, and epidemiological information. The expected result is Negative.  Fact Sheet for Patients:  EntrepreneurPulse.com.au  Fact Sheet for Healthcare Providers:  IncredibleEmployment.be  This test is no t yet approved or cleared by the [place] FDA and  has been authorized for detection and/or diagnosis of SARS-CoV-2 by FDA under an Emergency Use Authorization (EUA). This EUA will remain  in effect (meaning this  test can be used) for the duration of the COVID-19 declaration under Section 564(b)(1) of the Act, 21 U.S.C.section 360bbb-3(b)(1), unless the authorization is terminated  or revoked sooner.       Influenza A by PCR NEGATIVE NEGATIVE Final   Influenza B by PCR NEGATIVE NEGATIVE Final    Comment: (NOTE) The Xpert Xpress SARS-CoV-2/FLU/RSV plus assay is intended as an aid in the diagnosis of influenza from Nasopharyngeal swab specimens and should not be used as a sole basis for treatment. Nasal washings and aspirates are unacceptable for Xpert Xpress SARS-CoV-2/FLU/RSV testing.  Fact Sheet for Patients: EntrepreneurPulse.com.au  Fact Sheet for Healthcare Providers: IncredibleEmployment.be  This test is not yet approved or cleared by the [place] FDA and has been authorized for detection and/or diagnosis of SARS-CoV-2 by FDA under an Emergency Use Authorization (EUA). This EUA will remain in effect (meaning this test can be used) for the duration of the COVID-19 declaration under Section 564(b)(1) of the Act, 21 U.S.C. section 360bbb-3(b)(1), unless the authorization is terminated or revoked.  Performed at [facility], [address] [place],  [address]   Blood Culture (routine x 2)     Status: None   Collection Time: 11/25/20  8:17 PM   Specimen: BLOOD  Result Value Ref Range Status   Specimen Description BLOOD  LAC  Final   Special Requests   Final    BOTTLES DRAWN AEROBIC AND ANAEROBIC Blood Culture adequate volume   Culture   Final    NO GROWTH 5 DAYS Performed  at [facility], [address] [place], [place] [address]    Report Status 11/30/2020 FINAL  Final  Blood Culture (routine x 2)     Status: None   Collection Time: 11/25/20  8:17 PM   Specimen: BLOOD  Result Value Ref Range Status   Specimen Description BLOOD  RIGHT HAND  Final   Special Requests   Final    BOTTLES DRAWN AEROBIC AND  ANAEROBIC Blood Culture results may not be optimal due to an inadequate volume of blood received in culture bottles   Culture   Final    NO GROWTH 5 DAYS Performed at [facility], [address] [place],  [address]    Report Status 11/30/2020 FINAL  Final     Labs: BNP (last 3 results) Recent Labs    11/25/20 2016  BNP 85.4   Basic Metabolic Panel: Recent Labs  Lab 11/25/20 1830 11/26/20 0927 11/28/20 0651 11/30/20 0601  NA 130* 131* 139 138  K 4.4 4.3 4.0 4.0  CL 93* 95* 103 99  CO2 26 23 26 29   GLUCOSE 121* 111* 205* 118*  BUN 7 10 14 13   CREATININE 0.52* 0.59* 0.49* 0.60*  CALCIUM 8.8* 8.7* 8.4* 8.8*   Liver Function Tests: No results for input(s): AST, ALT, ALKPHOS, BILITOT, PROT, ALBUMIN in the last 168 hours. No results for input(s): LIPASE, AMYLASE in the last 168 hours. No results for input(s): AMMONIA in the last 168 hours. CBC: Recent Labs  Lab 11/25/20 1830 11/26/20 0927 11/28/20 0651 11/30/20 0601  WBC 20.0* 22.8* 16.7* 15.9*  HGB 13.5 12.7* 10.7* 12.2*  HCT 39.9 37.4* 31.3* 36.0*  MCV 93.9 92.6 92.3 92.8  PLT 287 264 251 355   Cardiac Enzymes: No results for input(s): CKTOTAL, CKMB, CKMBINDEX, TROPONINI in the last 168 hours. BNP: Invalid input(s): POCBNP CBG: Recent Labs  Lab 11/30/20 2119  GLUCAP 191*   D-Dimer No results for input(s): DDIMER in the last 72 hours. Hgb A1c No results for input(s): HGBA1C in the last 72 hours. Lipid Profile No results for input(s): CHOL, HDL, LDLCALC, TRIG, CHOLHDL, LDLDIRECT in the last 72 hours. Thyroid function studies No results for input(s): TSH, T4TOTAL, T3FREE, THYROIDAB in the last 72 hours.  Invalid input(s): FREET3 Anemia work up No results for input(s): VITAMINB12, FOLATE, FERRITIN, TIBC, IRON, RETICCTPCT in the last 72 hours. Urinalysis No results found for: COLORURINE, APPEARANCEUR, [place], [place], GLUCOSEU, [place], [place], [place], PROTEINUR, UROBILINOGEN,  NITRITE, LEUKOCYTESUR Sepsis Labs Invalid input(s): PROCALCITONIN,  WBC,  LACTICIDVEN Microbiology Recent Results (from the past 240 hour(s))  Resp Panel by RT-PCR (Flu A&B, Covid) Nasopharyngeal Swab     Status: None   Collection Time: 11/25/20  7:54 PM   Specimen: Nasopharyngeal Swab; Nasopharyngeal(NP) swabs in vial transport medium  Result Value Ref Range Status   SARS Coronavirus 2 by RT PCR NEGATIVE NEGATIVE Final    Comment: (NOTE) SARS-CoV-2 target nucleic acids are NOT DETECTED.  The SARS-CoV-2 RNA is generally detectable in upper respiratory specimens during the acute phase of infection. The lowest concentration of SARS-CoV-2 viral copies this assay can detect is 138 copies/mL. A negative result does not preclude SARS-Cov-2 infection and should not be used as the sole basis for treatment or other patient management decisions. A negative result may occur with  improper specimen collection/handling, submission of specimen other than nasopharyngeal swab, presence of viral mutation(s) within the areas targeted by this assay, and inadequate number of viral copies(<138 copies/mL). A negative result must  be combined with clinical observations, patient history, and epidemiological information. The expected result is Negative.  Fact Sheet for Patients:  EntrepreneurPulse.com.au  Fact Sheet for Healthcare Providers:  IncredibleEmployment.be  This test is no t yet approved or cleared by the [place] FDA and  has been authorized for detection and/or diagnosis of SARS-CoV-2 by FDA under an Emergency Use Authorization (EUA). This EUA will remain  in effect (meaning this test can be used) for the duration of the COVID-19 declaration under Section 564(b)(1) of the Act, 21 U.S.C.section 360bbb-3(b)(1), unless the authorization is terminated  or revoked sooner.       Influenza A by PCR NEGATIVE NEGATIVE Final   Influenza B by PCR NEGATIVE  NEGATIVE Final    Comment: (NOTE) The Xpert Xpress SARS-CoV-2/FLU/RSV plus assay is intended as an aid in the diagnosis of influenza from Nasopharyngeal swab specimens and should not be used as a sole basis for treatment. Nasal washings and aspirates are unacceptable for Xpert Xpress SARS-CoV-2/FLU/RSV testing.  Fact Sheet for Patients: EntrepreneurPulse.com.au  Fact Sheet for Healthcare Providers: IncredibleEmployment.be  This test is not yet approved or cleared by the [place] FDA and has been authorized for detection and/or diagnosis of SARS-CoV-2 by FDA under an Emergency Use Authorization (EUA). This EUA will remain in effect (meaning this test can be used) for the duration of the COVID-19 declaration under Section 564(b)(1) of the Act, 21 U.S.C. section 360bbb-3(b)(1), unless the authorization is terminated or revoked.  Performed at [facility], [address] [place], [place] [address]   Blood Culture (routine x 2)     Status: None   Collection Time: 11/25/20  8:17 PM   Specimen: BLOOD  Result Value Ref Range Status   Specimen Description BLOOD  LAC  Final   Special Requests   Final    BOTTLES DRAWN AEROBIC AND ANAEROBIC Blood Culture adequate volume   Culture   Final    NO GROWTH 5 DAYS Performed at [facility], [address] [place], [place] [address]    Report Status 11/30/2020 FINAL  Final  Blood Culture (routine x 2)     Status: None   Collection Time: 11/25/20  8:17 PM   Specimen: BLOOD  Result Value Ref Range Status   Specimen Description BLOOD  RIGHT HAND  Final   Special Requests   Final    BOTTLES DRAWN AEROBIC AND ANAEROBIC Blood Culture results may not be optimal due to an inadequate volume of blood received in culture bottles   Culture   Final    NO GROWTH 5 DAYS Performed at [facility], [address], [place], [place] [address]    Report Status 11/30/2020 FINAL  Final     Time coordinating discharge: Over 30 minutes  SIGNED:  Lorella Nimrod, MD  [facility] 12/01/2020, 11:04 AM  If 7PM-7AM, please contact night-coverage [email or web address]  This record has been created using Systems analyst. Errors have been sought and corrected,but may not always be located. Such creation errors do not reflect on the standard of care.

## 2020-12-01 NOTE — Progress Notes (Signed)
Curtis Manning.   DOB:Feb 06, 1971   XQ#:[id number]    Subjective: Patient s/p lung biopsy-CT-guided yesterday.  No complications.  Denies any significant worsening of breathing.   Objective:  Vitals:   12/01/20 0448 12/01/20 1023  BP: (!) 120/96 100/71  Pulse: 75 84  Resp: 18 18  Temp: 97.6 F (36.4 C) 98 F (36.7 C)  SpO2: 98% 96%     Intake/Output Summary (Last 24 hours) at 12/01/2020 2119 Last data filed at 12/01/2020 1330 Gross per 24 hour  Intake 960 ml  Output --  Net 960 ml    Physical Exam HENT:     Head: Normocephalic and atraumatic.     Mouth/Throat:     Pharynx: No oropharyngeal exudate.  Eyes:     Pupils: Pupils are equal, round, and reactive to light.  Cardiovascular:     Rate and Rhythm: Normal rate and regular rhythm.  Pulmonary:     Effort: No respiratory distress.     Breath sounds: No wheezing.     Comments: Decreased breath sounds bilaterally; scattered wheezing left more than right. Abdominal:     General: Bowel sounds are normal. There is no distension.     Palpations: Abdomen is soft. There is no mass.     Tenderness: There is no abdominal tenderness. There is no guarding or rebound.  Musculoskeletal:        General: No tenderness. Normal range of motion.     Cervical back: Normal range of motion and neck supple.  Skin:    General: Skin is warm.  Neurological:     Mental Status: He is alert and oriented to person, place, and time.  Psychiatric:        Mood and Affect: Affect normal.      Labs:  Lab Results  Component Value Date   WBC 15.9 (H) 11/30/2020   HGB 12.2 (L) 11/30/2020   HCT 36.0 (L) 11/30/2020   MCV 92.8 11/30/2020   PLT 355 11/30/2020    Lab Results  Component Value Date   NA 138 11/30/2020   K 4.0 11/30/2020   CL 99 11/30/2020   CO2 29 11/30/2020    Studies:  DG Chest 2 View  Result Date: 12/01/2020 CLINICAL DATA:  Status post biopsy EXAM: CHEST - 2 VIEW COMPARISON:  November 30, 2020 FINDINGS: No pneumothorax is  appreciable by radiography. There is a persistent residual pleural effusion on the left. Apparent masses in the left upper and left lower lobe regions with surrounding pneumonitis remains stable. Right lung is clear. Heart size and pulmonary vascularity normal. No adenopathy. No bone lesions. IMPRESSION: No pneumothorax evident by radiography. Mass lesions with surrounding pneumonitis on the left remain essentially stable with small left pleural effusion also noted. No new opacity evident. Right lung clear. Heart size normal. Electronically Signed   By: Lowella Grip III M.D.   On: 12/01/2020 09:37   DG Chest Port 1 View  Result Date: 11/30/2020 CLINICAL DATA:  Follow-up left pneumothorax after lung biopsy. EXAM: PORTABLE CHEST 1 VIEW COMPARISON:  CTA chest and chest x-ray dated Nov 25, 2020. FINDINGS: Normal heart size. Unchanged large left lung mass and postobstructive pneumonia. The right lung remains clear. Unchanged small left pleural effusion. Trace left pneumothorax seen on post biopsy CT is not readily identified by x-ray. No acute osseous abnormality. IMPRESSION: 1. Trace left pneumothorax seen on post biopsy CT is not readily identified by x-ray. No enlarging pneumothorax. Electronically Signed   By: Titus Dubin  M.D.   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 The patient is not a candidate for bronchoscopy currently and request has been made for percutaneous biopsy of tumor in order to establish a diagnosis. EXAM: CT GUIDED CORE BIOPSY OF LEFT LOWER LOBE LUNG MASS ANESTHESIA/SEDATION: 1.0 mg IV Versed; 100 mcg IV Fentanyl Total Moderate Sedation Time:  19 minutes. The patient's level of consciousness and physiologic status were continuously monitored during the procedure by Radiology nursing. PROCEDURE: The procedure risks, benefits, and  alternatives were explained to the patient. Questions regarding the procedure were encouraged and answered. The patient understands and consents to the procedure. A time-out was performed prior to initiating the procedure. CT was performed through the chest with the left side rolled up in an oblique position. The left chest wall was prepped with chlorhexidine in a sterile fashion, and a sterile drape was applied covering the operative field. A sterile gown and sterile gloves were used for the procedure. Local anesthesia was provided with 1% Lidocaine. A 17 gauge trocar needle was advanced to the level of a left lower lobe lung mass. After confirming needle tip position, 3 separate coaxial 18 gauge core biopsy samples were obtained and submitted in formalin. The BioSentry device was utilized in depositing a plug at the pleural entry site. Additional CT imaging was performed. COMPLICATIONS: Tiny left lateral pneumothorax. FINDINGS: The large mass of the left lower lobe was targeted for sampling measuring approximately 8.9 cm in greatest diameter. Solid tissue was obtained with core biopsy. After the procedure CT demonstrates a tiny left lateral pneumothorax. IMPRESSION: CT-guided core biopsy of 9 cm left lower lobe lung mass. The procedure was complicated by a tiny left lateral pneumothorax which will be followed during recovery by chest x-ray. Electronically Signed   By: Aletta Edouard M.D.   On: 11/30/2020 11:05    Lung mass #50 year old male patient with history of smoking is currently in the hospital for worsening left chest wall pain/shortness of breath; CT scan-left upper lobe lung mass/mediastinal adenopathy  # Left upper lobe mass/mediastinal adenopathy-highly concerning for lung malignancy.  Likely suggestive of stage III malignancy-however CT-guided biopsy-necrotic material/nondiagnostic.  Discussed with patient that he will need a PET scan.  We will also discuss with Dr.Aleskerov.   #Paroxysmal A.  Fib-stable sinus rhythm.  Above plan of care discussed with patient in detail.  Patient recontacted by nurse navigator regarding PET scan/consideration of biopsy; oncology follow-up.    Cammie Sickle, MD 12/01/2020  9:19 PM

## 2020-12-01 NOTE — Plan of Care (Signed)
  Problem: Education: Goal: Knowledge of General Education information will improve Description: Including pain rating scale, medication(s)/side effects and non-pharmacologic comfort measures 12/01/2020 1155 by Rometta Emery, RN Outcome: Adequate for Discharge 12/01/2020 1154 by Rometta Emery, RN Outcome: Adequate for Discharge   Problem: Health Behavior/Discharge Planning: Goal: Ability to manage health-related needs will improve 12/01/2020 1155 by Rometta Emery, RN Outcome: Adequate for Discharge 12/01/2020 1154 by Rometta Emery, RN Outcome: Adequate for Discharge   Problem: Clinical Measurements: Goal: Ability to maintain clinical measurements within normal limits will improve 12/01/2020 1155 by Rometta Emery, RN Outcome: Adequate for Discharge 12/01/2020 1154 by Rometta Emery, RN Outcome: Adequate for Discharge Goal: Will remain free from infection 12/01/2020 1155 by Rometta Emery, RN Outcome: Adequate for Discharge 12/01/2020 1154 by Rometta Emery, RN Outcome: Adequate for Discharge Goal: Diagnostic test results will improve 12/01/2020 1155 by Rometta Emery, RN Outcome: Adequate for Discharge 12/01/2020 1154 by Rometta Emery, RN Outcome: Adequate for Discharge Goal: Respiratory complications will improve 12/01/2020 1155 by Rometta Emery, RN Outcome: Adequate for Discharge 12/01/2020 1154 by Rometta Emery, RN Outcome: Adequate for Discharge Goal: Cardiovascular complication will be avoided 12/01/2020 1155 by Rometta Emery, RN Outcome: Adequate for Discharge 12/01/2020 1154 by Rometta Emery, RN Outcome: Adequate for Discharge   Problem: Activity: Goal: Risk for activity intolerance will decrease 12/01/2020 1155 by Rometta Emery, RN Outcome: Adequate for Discharge 12/01/2020 1154 by Rometta Emery, RN Outcome: Adequate for Discharge   Problem: Nutrition: Goal: Adequate nutrition will be maintained 12/01/2020 1155 by Rometta Emery,  RN Outcome: Adequate for Discharge 12/01/2020 1154 by Rometta Emery, RN Outcome: Adequate for Discharge   Problem: Coping: Goal: Level of anxiety will decrease 12/01/2020 1155 by Rometta Emery, RN Outcome: Adequate for Discharge 12/01/2020 1154 by Rometta Emery, RN Outcome: Adequate for Discharge   Problem: Elimination: Goal: Will not experience complications related to bowel motility 12/01/2020 1155 by Rometta Emery, RN Outcome: Adequate for Discharge 12/01/2020 1154 by Rometta Emery, RN Outcome: Adequate for Discharge Goal: Will not experience complications related to urinary retention 12/01/2020 1155 by Rometta Emery, RN Outcome: Adequate for Discharge 12/01/2020 1154 by Rometta Emery, RN Outcome: Adequate for Discharge   Problem: Pain Managment: Goal: General experience of comfort will improve 12/01/2020 1155 by Rometta Emery, RN Outcome: Adequate for Discharge 12/01/2020 1154 by Rometta Emery, RN Outcome: Adequate for Discharge   Problem: Safety: Goal: Ability to remain free from injury will improve 12/01/2020 1155 by Rometta Emery, RN Outcome: Adequate for Discharge 12/01/2020 1154 by Rometta Emery, RN Outcome: Adequate for Discharge   Problem: Skin Integrity: Goal: Risk for impaired skin integrity will decrease 12/01/2020 1155 by Rometta Emery, RN Outcome: Adequate for Discharge 12/01/2020 1154 by Rometta Emery, RN Outcome: Adequate for Discharge

## 2020-12-01 NOTE — Telephone Encounter (Signed)
Shawn/Colette-  please schedule PET ASAP.  Shawn- order the labs.   Colette- schedule follow up in week of 13th-  MD; lab-cbc/cmp- Dr.Bb

## 2020-12-01 NOTE — Consult Note (Signed)
Consultation Note Date: 12/01/2020   Patient Name: Curtis Manning.  DOB: [birth year]  MRN: [id number]  Age / Sex: 50 y.o., male   PCP: System, Provider Not In Referring Physician: No att. providers found   REASON FOR CONSULTATION:Establishing goals of care  Palliative Care consult requested for goals of care discussion in this 50 y.o. male with a medical history significant for  GERD, COPD, and tobacco use. Patient presented to ED from home with complaints of chest pain. Patient most recently seen at Urgent care where chest x-ray showed a left lung mass suspicious for malignancy. Recent CT of chest abdomen and pelvis which showed  large left perihilar and medial upper lobe mass with mediastinal invasion, as well as loculated malignant pleural effusion, pulmonary metastases and postobstructive pneumonia. Underwent a thoracentesis on 5/20 which yielded 600 ml. Since admission being followed by Cardiology and Pulmonology. Underwent lung biopsy.   Clinical Assessment and Goals of Care: I have reviewed medical records including lab results, imaging, Epic notes, and MAR, received report from the bedside RN, and assessed the patient.   I met at the bedside with patient  to discuss diagnosis prognosis, GOC, EOL wishes, disposition and options.  He is awake, alert and oriented x3. Denies pain. Does endorse some shortness of breath with exertion. States much better than previous days.   I introduced Palliative Medicine as specialized medical care for people living with serious illness. It focuses on providing relief from the symptoms and stress of a serious illness. The goal is to improve quality of life for both the patient and the family.  We discussed a brief life review of the patient, along with his functional and nutritional status. Patient shares he and his wife have been married for more than 15 years. He has no children. Dogs and cats. He lived in [place] for some time as his wife is  [place]. 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   Prior to admission patient was independent of all ADLs. He was actively working.   We discussed His current illness and what it means in the larger context of His on-going co-morbidities. Natural disease trajectory and expectations at EOL were discussed.  Patient verbalizes his understanding of his current illness and concerns for malignancy. He states he is mentally preparing himself for what is to come in the future once diagnosis is obtained. Support provided.   A detailed discussion was had today regarding advanced directives.  Concepts specific to code status, artifical feeding and hydration, continued IV antibiotics and rehospitalization. Curtis Manning states he does not have an advanced directive, however understands importance of completing documents. He states his wife would be his medical decision maker.   We discussed his full code status with consideration of his current illness and co-morbidities. Patient states if ever required would only wish to be on life-sustaining measures short-term with hopes of a meaningful recovery. If he has a poor prognosis or the medical team deemed him in a vegetative or non-recoverable state he would then want his care to focus on his comfort.   Values and goals of care important to patient and family were attempted to be elicited.   Mr. Handley is clear in expressed goals to continue to treat the treatable. He is emotionally preparing himself for an official cancer diagnosis and what options will be available to him. He is remaining hopeful and will be open to all aggressive interventions as recommended.    I discussed the importance  of continued conversation with family and their medical providers regarding overall plan of care and treatment options, ensuring decisions are within the context of the patients values and GOCs.  Palliative Care services outpatient were explained and offered. Patient  verbalized   understanding and awareness of palliative's goals and philosophy of care.   Questions and concerns were addressed.  Patient was encouraged to call with questions or concerns.  PMT will continue to support holistically as needed.   CODE STATUS: Full code  ADVANCE DIRECTIVES: Primary Decision Maker: Patient   SYMPTOM MANAGEMENT: per attending  Palliative Prophylaxis:   Frequent Pain Assessment  PSYCHO-SOCIAL/SPIRITUAL:  Support System: Family  Desire for further Chaplaincy support:No  Additional Recommendations (Limitations, Scope, Preferences):  Full Scope Treatment  Education on palliative    PAST MEDICAL HISTORY: Past Medical History:  Diagnosis Date  . Asthma     ALLERGIES:  has No Known Allergies.   MEDICATIONS:  Current Facility-Administered Medications  Medication Dose Route Frequency Provider Last Rate Last Admin  . 0.9 %  sodium chloride infusion   Intravenous PRN Kathie Dike, MD 10 mL/hr at 11/27/20 2043 500 mL at 11/27/20 2043  . Ampicillin-Sulbactam (UNASYN) 3 g in sodium chloride 0.9 % 100 mL IVPB  3 g Intravenous Q6H Memon, Jolaine Artist, MD 200 mL/hr at 12/01/20 1104 3 g at 12/01/20 1104  . cholecalciferol (VITAMIN D3) tablet 1,000 Units  1,000 Units Oral Daily Kathie Dike, MD   1,000 Units at 12/01/20 1100  . dexamethasone (DECADRON) tablet 4 mg  4 mg Oral BID Kathie Dike, MD   4 mg at 12/01/20 1101  . dextromethorphan-guaiFENesin (MUCINEX DM) 30-600 MG per 12 hr tablet 1 tablet  1 tablet Oral BID Lovey Newcomer T, NP   1 tablet at 12/01/20 1100  . digoxin (LANOXIN) tablet 0.25 mg  0.25 mg Oral Daily Kathie Dike, MD   0.25 mg at 12/01/20 1101  . diltiazem (CARDIZEM CD) 24 hr capsule 240 mg  240 mg Oral Daily Lorella Nimrod, MD   240 mg at 12/01/20 1101  . enoxaparin (LOVENOX) injection 40 mg  40 mg Subcutaneous Q24H Ascencion Dike, PA-C   40 mg at 11/30/20 2153  . feeding supplement (ENSURE ENLIVE / ENSURE PLUS) liquid 237 mL  237 mL Oral BID BM  Kathie Dike, MD   237 mL at 12/01/20 1103  . ipratropium (ATROVENT) nebulizer solution 0.5 mg  0.5 mg Nebulization Q6H PRN Clarnce Flock, MD   0.5 mg at 11/30/20 1109  . levalbuterol (XOPENEX) nebulizer solution 0.63 mg  0.63 mg Nebulization Q6H PRN Kathie Dike, MD   0.63 mg at 11/29/20 1859  . metoprolol tartrate (LOPRESSOR) tablet 50 mg  50 mg Oral Q6H Neoma Laming A, MD   50 mg at 12/01/20 1100  . morphine 4 MG/ML injection 4 mg  4 mg Intravenous Q4H PRN Clarnce Flock, MD   4 mg at 11/26/20 0725  . multivitamin with minerals tablet 1 tablet  1 tablet Oral Daily Clarnce Flock, MD   1 tablet at 12/01/20 1100  . ondansetron (ZOFRAN) tablet 4 mg  4 mg Oral Q6H PRN Clarnce Flock, MD       Or  . ondansetron [facility]) injection 4 mg  4 mg Intravenous Q6H PRN Clarnce Flock, MD   4 mg at 11/26/20 0725  . oxyCODONE-acetaminophen (PERCOCET/ROXICET) 5-325 MG per tablet 1-2 tablet  1-2 tablet Oral Q4H PRN Kathie Dike, MD   2 tablet at 12/01/20 1100  .  pantoprazole (PROTONIX) EC tablet 40 mg  40 mg Oral Daily Clarnce Flock, MD   40 mg at 12/01/20 1101  . polyethylene glycol (MIRALAX / GLYCOLAX) packet 17 g  17 g Oral Daily PRN Clarnce Flock, MD      . traZODone (DESYREL) tablet 50 mg  50 mg Oral QHS PRN Clarnce Flock, MD   50 mg at 11/27/20 2253   Current Outpatient Medications  Medication Sig Dispense Refill  . albuterol (PROVENTIL) (2.5 MG/3ML) 0.083% nebulizer solution Inhale 3 mLs into the lungs every 6 (six) hours as needed.    Marland Kitchen albuterol (VENTOLIN HFA) 108 (90 Base) MCG/ACT inhaler Inhale 1-2 puffs into the lungs every 4 (four) hours as needed for wheezing or shortness of breath. 1 each 2  . chlorpheniramine-HYDROcodone (TUSSIONEX) 10-8 MG/5ML SUER Take 5 mLs by mouth every 12 (twelve) hours as needed for cough.    . feeding supplement (ENSURE ENLIVE / ENSURE PLUS) LIQD Take 237 mLs by mouth 3 (three) times daily between meals. 237 mL 12  .  ipratropium-albuterol (DUONEB) 0.5-2.5 (3) MG/3ML SOLN Take 3 mLs by nebulization every 6 (six) hours as needed. 75 mL 2  . omeprazole (PRILOSEC) 10 MG capsule Take 20 mg by mouth daily.    . cholecalciferol (VITAMIN D) 25 MCG tablet Take 1 tablet (1,000 Units total) by mouth daily. Can take any over-the-counter. (Patient not taking: Reported on 11/25/2020)    . dexamethasone (DECADRON) 4 MG tablet Take 1 tablet (4 mg total) by mouth daily for 10 days. 10 tablet 0  . digoxin (LANOXIN) 0.25 MG tablet Take 1 tablet (0.25 mg total) by mouth daily. 30 tablet 0  . diltiazem (CARDIZEM CD) 240 MG 24 hr capsule Take 1 capsule (240 mg total) by mouth daily. 30 capsule 0  . metoprolol tartrate (LOPRESSOR) 50 MG tablet Take 1 tablet (50 mg total) by mouth every 6 (six) hours. 120 tablet 0  . Multiple Vitamin (MULTIVITAMIN WITH MINERALS) TABS tablet Take 1 tablet by mouth daily. (Patient not taking: Reported on 11/25/2020)      VITAL SIGNS: BP 100/71 (BP Location: Left Arm)   Pulse 84   Temp 98 F (36.7 C)   Resp 18   Ht 5' 8"  (1.727 m)   Wt 66.5 kg   SpO2 96%   BMI 22.29 kg/m  Filed Weights   11/26/20 1450 11/28/20 1300 12/01/20 1023  Weight: 63.3 kg 67.9 kg 66.5 kg    Estimated body mass index is 22.29 kg/m as calculated from the following:   Height as of this encounter: 5' 8"  (1.727 m).   Weight as of this encounter: 66.5 kg.  LABS: CBC:    Component Value Date/Time   WBC 15.9 (H) 11/30/2020 0601   HGB 12.2 (L) 11/30/2020 0601   HCT 36.0 (L) 11/30/2020 0601   PLT 355 11/30/2020 0601   Comprehensive Metabolic Panel:    Component Value Date/Time   NA 138 11/30/2020 0601   K 4.0 11/30/2020 0601   BUN 13 11/30/2020 0601   CREATININE 0.60 (L) 11/30/2020 0601   ALBUMIN 2.9 (L) 11/11/2020 1702     Review of Systems  Constitutional: Positive for fatigue.  Respiratory: Positive for shortness of breath.   Unless otherwise noted, a complete review of systems is negative.  Physical  Exam General: NAD Cardiovascular: regular rate and rhythm Pulmonary:diminished bilaterally  Abdomen: soft, nontender, + bowel sounds Extremities: no edema, no joint deformities Skin: no rashes, warm and dry Neurological:  AAO x3, mood appropriate  Prognosis: Unable to determine  Discharge Planning:  Home with outpatient palliative follow-up  Recommendations: . Full Code-as confirmed by patient . Continue with current plan of care . Patient is hopeful for some improvement. Preparing himself mentally for definitive cancer diagnosis and what the future will hold. Is open to all medical interventions and treatment options to allow him every opportunity to continue to thrive.  . Patient would benefit in ongoing Palliative support at [facility] with Billey Chang, NP once diagnosis and treatment plan has been established.   Marland Kitchen PMT will continue to support and follow as needed. Please call team line with urgent needs.   Palliative Performance Scale: PPS 30-40%               Patient expressed understanding and was in agreement with this plan.   Thank you for allowing the Palliative Medicine Team to assist in the care of this patient. Please utilize secure chat with additional questions, if there is no response within 30 minutes please call the above phone number.   Time In: 1110 Time Out: 1205 Time Total: 55 min.   Visit consisted of counseling and education dealing with the complex and emotionally intense issues of symptom management and palliative care in the setting of serious and potentially life-threatening illness.Greater than 50%  of this time was spent counseling and coordinating care related to the above assessment and plan.  Signed by:  Alda Lea, AGPCNP-BC Palliative Medicine Team  Phone: [phone] Pager: [phone] Amion: [place] Team providers are available by phone from 7am to 7pm daily and can be reached through the  team cell phone.  Should this patient require assistance outside of these hours, please call the patient's attending physician.

## 2020-12-01 NOTE — Progress Notes (Signed)
SUBJECTIVE: Patient is feeling much better   Vitals:   11/30/20 1102 11/30/20 1554 11/30/20 2004 12/01/20 0448  BP: 128/85 117/66 107/89 (!) 120/96  Pulse: 100 99 61 75  Resp: 20 17 18 18   Temp: 97.6 F (36.4 C) 97.7 F (36.5 C) 97.9 F (36.6 C) 97.6 F (36.4 C)  TempSrc: Oral Oral    SpO2: 97% 93% 96% 98%  Weight:      Height:        Intake/Output Summary (Last 24 hours) at 12/01/2020 5284 Last data filed at 11/30/2020 1119 Gross per 24 hour  Intake 360 ml  Output --  Net 360 ml    LABS: Basic Metabolic Panel: Recent Labs    11/30/20 0601  NA 138  K 4.0  CL 99  CO2 29  GLUCOSE 118*  BUN 13  CREATININE 0.60*  CALCIUM 8.8*   Liver Function Tests: No results for input(s): AST, ALT, ALKPHOS, BILITOT, PROT, ALBUMIN in the last 72 hours. No results for input(s): LIPASE, AMYLASE in the last 72 hours. CBC: Recent Labs    11/30/20 0601  WBC 15.9*  HGB 12.2*  HCT 36.0*  MCV 92.8  PLT 355   Cardiac Enzymes: No results for input(s): CKTOTAL, CKMB, CKMBINDEX, TROPONINI in the last 72 hours. BNP: Invalid input(s): POCBNP D-Dimer: No results for input(s): DDIMER in the last 72 hours. Hemoglobin A1C: No results for input(s): HGBA1C in the last 72 hours. Fasting Lipid Panel: No results for input(s): CHOL, HDL, LDLCALC, TRIG, CHOLHDL, LDLDIRECT in the last 72 hours. Thyroid Function Tests: No results for input(s): TSH, T4TOTAL, T3FREE, THYROIDAB in the last 72 hours.  Invalid input(s): FREET3 Anemia Panel: No results for input(s): VITAMINB12, FOLATE, FERRITIN, TIBC, IRON, RETICCTPCT in the last 72 hours.   PHYSICAL EXAM General: Well developed, well nourished, in no acute distress HEENT:  Normocephalic and atramatic Neck:  No JVD.  Lungs: Clear bilaterally to auscultation and percussion. Heart: HRRR . Normal S1 and S2 without gallops or murmurs.  Abdomen: Bowel sounds are positive, abdomen soft and non-tender  Msk:  Back normal, normal gait. Normal strength  and tone for age. Extremities: No clubbing, cyanosis or edema.   Neuro: Alert and oriented X 3. Psych:  Good affect, responds appropriately  TELEMETRY: A. fib with controlled ventricular rate about 100  ASSESSMENT AND PLAN: Atrial fibrillation with rapid ventricular response rate with lung mass currently in controlled ventricular rate on p.o. medication doing well.  Patient can be followed Friday at 9 AM in my office upon discharge.  Active Problems:   Community acquired pneumonia of left lung   Lung mass   Pleural effusion on left   GERD (gastroesophageal reflux disease)   COPD (chronic obstructive pulmonary disease) (HCC)   Sinus tachycardia   Atrial flutter (HCC)   Pneumothorax of left lung after biopsy    Neoma Laming A, MD, [facility] 12/01/2020 8:33 AM

## 2020-12-02 NOTE — Telephone Encounter (Signed)
Not as of right now due to Medicaid pending it is scheduled

## 2020-12-02 NOTE — Telephone Encounter (Signed)
This is a new pt to our clinic can you put appt on for me please well handle the PET

## 2020-12-05 ENCOUNTER — Encounter: Payer: Self-pay | Admitting: *Deleted

## 2020-12-05 NOTE — Telephone Encounter (Signed)
Attempted to contact pt to review upcoming appts. Pt did not answer and unable to leave message. Pt is active on MyChart. Message sent to pt to notify of appts via [place]. Contact info left in message and instructed to call with any further questions or needs.

## 2020-12-05 NOTE — Telephone Encounter (Signed)
No worries. I will give him a call to follow up with him. Thanks!

## 2020-12-05 NOTE — Addendum Note (Signed)
Addended by: [name] on: 12/05/2020 08:23 AM   Modules accepted: Orders

## 2020-12-05 NOTE — Telephone Encounter (Signed)
Are you informing the pt of these appts?

## 2020-12-05 NOTE — Telephone Encounter (Signed)
Curtis Manning- do you know if patient is aware of his appt? I didn't want to call him again if he is already aware.

## 2020-12-06 NOTE — Telephone Encounter (Signed)
Spoke with patient over the phone to review upcoming appts. All questions answered during call. Instructed pt to callback with any further questions or needs. Pt verbalized understanding.

## 2020-12-08 ENCOUNTER — Other Ambulatory Visit: Payer: Self-pay

## 2020-12-08 ENCOUNTER — Ambulatory Visit
Admission: RE | Admit: 2020-12-08 | Discharge: 2020-12-08 | Disposition: A | Discharge status: Home / Self Care | Payer: Medicaid Other | Source: Ambulatory Visit | Attending: Internal Medicine | Admitting: Internal Medicine

## 2020-12-08 DIAGNOSIS — R590 Localized enlarged lymph nodes: Secondary | ICD-10-CM | POA: Diagnosis not present

## 2020-12-08 DIAGNOSIS — J90 Pleural effusion, not elsewhere classified: Secondary | ICD-10-CM | POA: Diagnosis not present

## 2020-12-08 DIAGNOSIS — I700 Atherosclerosis of aorta: Secondary | ICD-10-CM | POA: Diagnosis not present

## 2020-12-08 DIAGNOSIS — R918 Other nonspecific abnormal finding of lung field: Secondary | ICD-10-CM | POA: Diagnosis present

## 2020-12-08 LAB — GLUCOSE, CAPILLARY: Glucose-Capillary: 121 mg/dL — ABNORMAL HIGH (ref 70–99)

## 2020-12-08 MED ORDER — FLUDEOXYGLUCOSE F - 18 (FDG) INJECTION
7.6000 | Freq: Once | INTRAVENOUS | Status: AC | PRN
  Administered 2020-12-08: 7.7 via INTRAVENOUS

## 2020-12-13 ENCOUNTER — Inpatient Hospital Stay: Payer: Medicaid Other | Attending: Internal Medicine | Admitting: Internal Medicine

## 2020-12-13 ENCOUNTER — Other Ambulatory Visit: Payer: Self-pay

## 2020-12-13 ENCOUNTER — Inpatient Hospital Stay: Payer: Medicaid Other

## 2020-12-13 ENCOUNTER — Encounter: Payer: Self-pay | Admitting: *Deleted

## 2020-12-13 ENCOUNTER — Encounter: Payer: Self-pay | Admitting: Internal Medicine

## 2020-12-13 VITALS — BP 99/67 | HR 76 | Temp 97.6°F | Resp 22 | Ht 68.0 in | Wt 145.2 lb

## 2020-12-13 DIAGNOSIS — R918 Other nonspecific abnormal finding of lung field: Secondary | ICD-10-CM

## 2020-12-13 DIAGNOSIS — Z5112 Encounter for antineoplastic immunotherapy: Secondary | ICD-10-CM | POA: Diagnosis not present

## 2020-12-13 DIAGNOSIS — I4891 Unspecified atrial fibrillation: Secondary | ICD-10-CM | POA: Diagnosis not present

## 2020-12-13 DIAGNOSIS — Z5189 Encounter for other specified aftercare: Secondary | ICD-10-CM | POA: Insufficient documentation

## 2020-12-13 DIAGNOSIS — Z5111 Encounter for antineoplastic chemotherapy: Secondary | ICD-10-CM | POA: Diagnosis present

## 2020-12-13 DIAGNOSIS — C8339 Diffuse large B-cell lymphoma, extranodal and solid organ sites: Secondary | ICD-10-CM | POA: Diagnosis present

## 2020-12-13 DIAGNOSIS — Z87891 Personal history of nicotine dependence: Secondary | ICD-10-CM | POA: Diagnosis not present

## 2020-12-13 DIAGNOSIS — R590 Localized enlarged lymph nodes: Secondary | ICD-10-CM

## 2020-12-13 LAB — COMPREHENSIVE METABOLIC PANEL
ALT: 22 U/L (ref 0–44)
AST: 22 U/L (ref 15–41)
Albumin: 3.3 g/dL — ABNORMAL LOW (ref 3.5–5.0)
Alkaline Phosphatase: 70 U/L (ref 38–126)
Anion gap: 8 (ref 5–15)
BUN: 22 mg/dL — ABNORMAL HIGH (ref 6–20)
CO2: 26 mmol/L (ref 22–32)
Calcium: 8.5 mg/dL — ABNORMAL LOW (ref 8.9–10.3)
Chloride: 100 mmol/L (ref 98–111)
Creatinine, Ser: 0.63 mg/dL (ref 0.61–1.24)
GFR, Estimated: >60 mL/min (ref >60)
Glucose, Bld: 97 mg/dL (ref 70–99)
Potassium: 4.3 mmol/L (ref 3.5–5.1)
Sodium: 134 mmol/L — ABNORMAL LOW (ref 135–145)
Total Bilirubin: 0.8 mg/dL (ref 0.3–1.2)
Total Protein: 6.1 g/dL — ABNORMAL LOW (ref 6.5–8.1)

## 2020-12-13 LAB — CBC WITH DIFFERENTIAL/PLATELET
Abs Immature Granulocytes: 0.4 10*3/uL — ABNORMAL HIGH (ref 0.00–0.07)
Basophils Absolute: 0.1 10*3/uL (ref 0.0–0.1)
Basophils Relative: 0 %
Eosinophils Absolute: 0.4 10*3/uL (ref 0.0–0.5)
Eosinophils Relative: 2 %
HCT: 42.3 % (ref 39.0–52.0)
Hemoglobin: 14.2 g/dL (ref 13.0–17.0)
Immature Granulocytes: 2 %
Lymphocytes Relative: 10 %
Lymphs Abs: 1.9 10*3/uL (ref 0.7–4.0)
MCH: 31.2 pg (ref 26.0–34.0)
MCHC: 33.6 g/dL (ref 30.0–36.0)
MCV: 93 fL (ref 80.0–100.0)
Monocytes Absolute: 1.3 10*3/uL — ABNORMAL HIGH (ref 0.1–1.0)
Monocytes Relative: 7 %
Neutro Abs: 14.4 10*3/uL — ABNORMAL HIGH (ref 1.7–7.7)
Neutrophils Relative %: 79 %
Platelets: 220 10*3/uL (ref 150–400)
RBC: 4.55 MIL/uL (ref 4.22–5.81)
RDW: 15 % (ref 11.5–15.5)
WBC: 18.4 10*3/uL — ABNORMAL HIGH (ref 4.0–10.5)
nRBC: 0 % (ref 0.0–0.2)

## 2020-12-13 NOTE — Progress Notes (Signed)
[name] CONSULT NOTE  Patient Care Team: System, Provider Not In as PCP - General [name], RN as Oncology Nurse Navigator  CHIEF COMPLAINTS/PURPOSE OF CONSULTATION:  Lung mass  #May 2022-CT scan bulky mediastinal adenopathy/large hilar lung mass; June 2022-lung biopsy necrotic tissue/nondiagnostic.   #Paroxysmal A. Fib- [May 2876]; currently resolved no anticoagulation    HISTORY OF PRESENTING ILLNESS:  Curtis Manning. 50 y.o.  male patient with history of smoking was recently evaluated in the hospital for worsening shortness of breath/noted to have CT imaging concerning for malignancy of the lung.  Summarized above patient had a lung biopsy left lower lung that showed necrotic tissue nondiagnostic.  Patient is here to discuss further management options.  Continues to have shortness of breath cough.  Intermittent headaches. No nausea vomiting.   Review of Systems  Constitutional:  Positive for malaise/fatigue and weight loss. Negative for chills, diaphoresis and fever.  HENT:  Negative for nosebleeds and sore throat.   Eyes:  Negative for double vision.  Respiratory:  Positive for cough and shortness of breath. Negative for hemoptysis and wheezing.   Cardiovascular:  Positive for chest pain. Negative for palpitations, orthopnea and leg swelling.  Gastrointestinal:  Negative for abdominal pain, blood in stool, constipation, diarrhea, heartburn, melena, nausea and vomiting.  Genitourinary:  Negative for dysuria, frequency and urgency.  Musculoskeletal:  Negative for back pain and joint pain.  Skin: Negative.  Negative for itching and rash.  Neurological:  Negative for dizziness, tingling, focal weakness, weakness and headaches.  Endo/Heme/Allergies:  Does not bruise/bleed easily.  Psychiatric/Behavioral:  Negative for depression. The patient is not nervous/anxious and does not have insomnia.     MEDICAL HISTORY:  Past Medical History:  Diagnosis Date  .  A-fib ([place])   . Asthma   . GERD (gastroesophageal reflux disease)   . Lung mass   . Pleural effusion     SURGICAL HISTORY: Past Surgical History:  Procedure Laterality Date  . IR IMAGING GUIDED PORT INSERTION  12/16/2020    SOCIAL HISTORY: Social History   Socioeconomic History  . Marital status: Married    Spouse name: Not on file  . Number of children: Not on file  . Years of education: Not on file  . Highest education level: Not on file  Occupational History  . Not on file  Tobacco Use  . Smoking status: Former    Packs/day: 0.50    Years: 20.00    Pack years: 10.00    Types: Cigarettes    Quit date: 11/25/2020    Years since quitting: 0.0  . Smokeless tobacco: Never  Vaping Use  . Vaping Use: Never used  Substance and Sexual Activity  . Alcohol use: Yes    Comment: "5 to 6 shots on weekends"  . Drug use: Never  . Sexual activity: Not on file  Other Topics Concern  . Not on file  Social History Narrative   Patient is a active smoker.;  Also liquor.  Lives with his wife in [place].  No children.  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 High blood pressure Mother   .  Heart Problems Father   . Stomach cancer Paternal Grandmother     ALLERGIES:  has No Known Allergies.  MEDICATIONS:  Current Outpatient Medications  Medication Sig Dispense Refill  . albuterol (PROVENTIL) (2.5 MG/3ML) 0.083% nebulizer solution Inhale 3 mLs into the lungs every 6 (six) hours as needed.    Marland Kitchen albuterol (VENTOLIN HFA) 108 (90 Base) MCG/ACT inhaler Inhale 1-2 puffs into the lungs every 4 (four) hours as needed for wheezing or shortness of breath. 1 each 2  .  cholecalciferol (VITAMIN D) 25 MCG tablet Take 1 tablet (1,000 Units total) by mouth daily. Can take any over-the-counter.    . diltiazem (CARDIZEM CD) 240 MG 24 hr capsule Take 1 capsule (240 mg total) by mouth daily. 30 capsule 0  . feeding supplement (ENSURE ENLIVE / ENSURE PLUS) LIQD Take 237 mLs by mouth 3 (three) times daily between meals. 237 mL 12  . ipratropium-albuterol (DUONEB) 0.5-2.5 (3) MG/3ML SOLN Take 3 mLs by nebulization every 6 (six) hours as needed. 75 mL 2  . metoprolol tartrate (LOPRESSOR) 50 MG tablet Take 1 tablet (50 mg total) by mouth every 6 (six) hours. 120 tablet 0  . Multiple Vitamin (MULTIVITAMIN WITH MINERALS) TABS tablet Take 1 tablet by mouth daily.    Marland Kitchen omeprazole (PRILOSEC) 10 MG capsule Take 20 mg by mouth daily.    Marland Kitchen acyclovir (ZOVIRAX) 400 MG tablet Take 1 tablet (400 mg total) by mouth 2 (two) times daily. One pill a day [to prevent shingles] 60 tablet 3  . allopurinol (ZYLOPRIM) 300 MG tablet Take 1 tablet (300 mg total) by mouth 2 (two) times daily. 120 tablet 0  . chlorpheniramine-HYDROcodone (TUSSIONEX) 10-8 MG/5ML SUER Take 5 mLs by mouth every 12 (twelve) hours as needed for cough. (Patient not taking: No sig reported)    . diphenhydrAMINE (BENADRYL) 25 MG tablet Take 25 mg by mouth every 6 (six) hours as needed.    Marland Kitchen HYDROcodone-acetaminophen (NORCO/VICODIN) 5-325 MG tablet Take 1 tablet by mouth every 6 (six) hours as needed.    . lidocaine-prilocaine (EMLA) cream Apply 30 -45 mins prior to port access. 30 g 0  . montelukast (SINGULAIR) 10 MG tablet Start 2 days prior to infusion. 30 tablet 0  . ondansetron (ZOFRAN) 8 MG tablet One pill every 8 hours as needed for nausea/vomitting. 40 tablet 1  . predniSONE (DELTASONE) 50 MG tablet Take 100 mg once day x 5 days; START on the day of chemo 10 tablet 5  . prochlorperazine (COMPAZINE) 10 MG tablet Take 1 tablet (10 mg total) by mouth every 6 (six) hours as needed for nausea or vomiting. 40 tablet 1   No  current facility-administered medications for this visit.      Marland Kitchen  PHYSICAL EXAMINATION: ECOG PERFORMANCE STATUS: 1 - Symptomatic but completely ambulatory  Vitals:   12/13/20 1116  BP: 99/67  Pulse: 76  Resp: (!) 22  Temp: 97.6 F (36.4 C)   Filed Weights   12/13/20 1116  Weight: 145 lb 3.2 oz (65.9 kg)    Physical Exam Vitals and nursing note reviewed.  Constitutional:      Comments: Ambulating: Independently  Accompanied: Wife  HENT:     Head: Normocephalic and atraumatic.     Mouth/Throat:     Pharynx: Oropharynx is clear.  Eyes:     Extraocular Movements: Extraocular movements intact.     Pupils: Pupils are equal, round, and reactive to light.  Cardiovascular:     Rate and Rhythm: Normal  rate and regular rhythm.  Pulmonary:     Comments: Decreased breath sounds bilaterally.  Abdominal:     Palpations: Abdomen is soft.  Musculoskeletal:        General: Normal range of motion.     Cervical back: Normal range of motion.  Skin:    General: Skin is warm.  Neurological:     General: No focal deficit present.     Mental Status: He is alert and oriented to person, place, and time.  Psychiatric:        Behavior: Behavior normal.        Judgment: Judgment normal.     LABORATORY DATA:  I have reviewed the data as listed Lab Results  Component Value Date   WBC 11.1 (H) 12/22/2020   HGB 12.2 (L) 12/22/2020   HCT 36.4 (L) 12/22/2020   MCV 91.2 12/22/2020   PLT 240 12/22/2020   Recent Labs    11/11/20 1702 11/12/20 0439 11/30/20 0601 12/13/20 1216 12/22/20 1549  NA 133*   < > 138 134* 139  K 4.3   < > 4.0 4.3 3.5  CL 99   < > 99 100 104  CO2 23   < > 29 26 29   GLUCOSE 124*   < > 118* 97 109*  BUN 9   < > 13 22* 14  CREATININE 0.44*   < > 0.60* 0.63 0.59*  CALCIUM 9.0   < > 8.8* 8.5* 9.3  GFRNONAA >60   < > >60 >60 >60  PROT 6.9  --   --  6.1* 7.1  ALBUMIN 2.9*  --   --  3.3* 3.4*  AST 25  --   --  22 24  ALT 13  --   --  22 24  ALKPHOS 76  --    --  70 70  BILITOT 0.5  --   --  0.8 0.5   < > = values in this interval not displayed.    RADIOGRAPHIC STUDIES: I have personally reviewed the radiological images as listed and agreed with the findings in the report. DG Chest 2 View  Result Date: 12/01/2020 CLINICAL DATA:  Status post biopsy EXAM: CHEST - 2 VIEW COMPARISON:  November 30, 2020 FINDINGS: No pneumothorax is appreciable by radiography. There is a persistent residual pleural effusion on the left. Apparent masses in the left upper and left lower lobe regions with surrounding pneumonitis remains stable. Right lung is clear. Heart size and pulmonary vascularity normal. No adenopathy. No bone lesions. IMPRESSION: No pneumothorax evident by radiography. Mass lesions with surrounding pneumonitis on the left remain essentially stable with small left pleural effusion also noted. No new opacity evident. Right lung clear. Heart size normal. Electronically Signed   By: Lowella Grip III M.D.   On: 12/01/2020 09:37   DG Chest 2 View  Result Date: 11/25/2020 CLINICAL DATA:  Chest pain and shortness of breath EXAM: CHEST - 2 VIEW COMPARISON:  Chest radiograph 11/18/2020, chest CT 11/11/2020 FINDINGS: Unchanged cardiomediastinal silhouette. There is increased opacification of the left upper lung, with unchanged appearance of the left perihilar and left lower lobe mass. Small left pleural effusion. No visible pneumothorax. IMPRESSION: Increased opacification of the left upper lung with known left perihilar and left lower lobe masses, consistent with worsening postobstructive pneumonia. Small left pleural effusion. Electronically Signed   By: Maurine Simmering   On: 11/25/2020 19:04   CT Angio Chest PE W and/or Wo Contrast  Result Date: 11/25/2020  CLINICAL DATA:  History of pneumonia left-sided chest pain EXAM: CT ANGIOGRAPHY CHEST WITH CONTRAST TECHNIQUE: Multidetector CT imaging of the chest was performed using the standard protocol during bolus  administration of intravenous contrast. Multiplanar CT image reconstructions and MIPs were obtained to evaluate the vascular anatomy. CONTRAST:  59mL OMNIPAQUE IOHEXOL 350 MG/ML SOLN COMPARISON:  Chest x-ray 11/25/2020, CT chest 11/11/2020 FINDINGS: Cardiovascular: Satisfactory opacification of the pulmonary arteries to the segmental level. No evidence of pulmonary embolism. Encasement and diffuse narrowing of left-sided pulmonary vasculature secondary to lung mass. Normal cardiac size. Trace pericardial effusion. Mediastinum/Nodes: No thyroid mass. Mild narrowing of left mainstem bronchus with occlusion of left upper and lower lobe bronchi by tumor. Redemonstrated bulky mediastinal and left hilar adenopathy. Index prevascular node measuring 2.1 cm, 2.2 cm previously. Conglomerate subcarinal and left hilar adenopathy versus tumor infiltration. Enlarged left cardio phrenic node up to 8 mm, previously 7 mm. Esophageal displacement to the right by mediastinal adenopathy/mass, esophagus cannot be clearly separated from bulky mediastinal mass. Distal esophageal nodes measuring up to 12 mm. Lungs/Pleura: Moderate partially loculated left pleural effusion as before. Large poorly defined medial left upper lobe and left hilar lung mass with mediastinal invasion and or poorly defined adenopathy. Large presumed metastatic mass mostly within the left lower lobe, measuring 8.7 by 5.8 cm, previously 7.5 x 4.1 cm. Subpleural anterior left lower lobe lung mass measures 2.9 x 1.8 cm, previously 2.6 x 1.2 cm, series 6, image 78. Extensive ground-glass density and patchy consolidation in the left upper lobe consistent with postobstructive pneumonia. Mild nodularity along the major fissure. Slightly worsened ground-glass density in the left lower lobe and surrounding the dominant left lower lobe lung mass. New or increased irregular nodularity in the left lateral lung base, series 6, image 77. Upper Abdomen: No acute abnormality.  Subcentimeter low-density lesions in the liver too small to further characterize. Musculoskeletal: No acute osseous abnormality Review of the MIP images confirms the above findings. IMPRESSION: 1. Negative for acute pulmonary embolus. Diffuse narrowing and encasement of left-sided pulmonary vasculature by large lung mass. 2. Large poorly defined left upper lobe and hilar lung mass with bulky mediastinal and left hilar metastatic disease. Possible mediastinal invasion by large left upper lobe lung mass. Occlusion of left upper and lower lobe bronchi by tumor. Metastatic lesions in the left lower lobe measuring up to 8.7 cm, increased compared to prior CT. Slight worsening of diffuse ground-glass density in the left lower lobe, likely worsening postobstructive pneumonia. Continued extensive consolidation and ground-glass densities in the left upper lobe, also consistent with postobstructive pneumonia. Redemonstrated fissural nodularity in the left upper lobe raising concern for metastatic disease. Moderate partially loculated left-sided pleural effusion. Electronically Signed   By: Donavan Foil M.D.   On: 11/25/2020 21:04   MR Brain W Wo Contrast  Result Date: 12/16/2020 CLINICAL DATA:  Lung mass.  Evaluation for metastatic disease. EXAM: MRI HEAD WITHOUT AND WITH CONTRAST TECHNIQUE: Multiplanar, multiecho pulse sequences of the brain and surrounding structures were obtained without and with intravenous contrast. CONTRAST:  104mL GADAVIST GADOBUTROL 1 MMOL/ML IV SOLN COMPARISON:  None. FINDINGS: Brain: There is no evidence of an acute infarct, intracranial hemorrhage, mass, midline shift, or extra-axial fluid collection. The ventricles and sulci are normal. The brain is normal in signal. No abnormal brain parenchymal or meningeal enhancement is identified. A small developmental venous anomaly is incidentally noted laterally in the right cerebellar hemisphere. Vascular: Major intracranial vascular flow voids are  preserved. Skull and upper  cervical spine: Unremarkable bone marrow signal. Sinuses/Orbits: Unremarkable orbits. Minimal mucosal thickening in the left maxillary sinus. Trace bilateral mastoid fluid. Other: Small right frontal scalp lipoma. IMPRESSION: Unremarkable appearance of the brain. No evidence of intracranial metastases. Electronically Signed   By: Logan Bores M.D.   On: 12/16/2020 12:06   NM PET Image Initial (PI) Skull Base To Thigh  Result Date: 12/09/2020 CLINICAL DATA:  Initial treatment strategy for non-small cell lung cancer. EXAM: NUCLEAR MEDICINE PET SKULL BASE TO THIGH TECHNIQUE: 7.7 mCi F-18 FDG was injected intravenously. Full-ring PET imaging was performed from the skull base to thigh after the radiotracer. CT data was obtained and used for attenuation correction and anatomic localization. Fasting blood glucose: 121 mg/dl COMPARISON:  CT chest 11/25/2020 FINDINGS: Mediastinal blood pool activity: SUV max 1.4 Liver activity: SUV max NA NECK: A 0.7 cm left level V lymph node on image 60 of series 3 has a maximum SUV of 6.0. Additional lymph nodes straddle between level IV and the supraclavicular region, and will be counted as supraclavicular nodes below. Incidental CT findings: none CHEST: Hypermetabolic supraclavicular, paratracheal, prevascular, AP window, left paratracheal, left paraspinal, bilateral hilar, left infrahilar, lower paraesophageal, and subcarinal adenopathy noted. Right supraclavicular node 1.4 cm in short axis on image 66 series 3 with maximum SUV 6.7. Anterior AP window lymph node 2.8 cm in short axis on image 100 series 3, maximum SUV 9.0. Index right lower paratracheal lymph node 2.0 cm in short axis on image 99 series 3, maximum SUV 8.9. Subcarinal mass noted involving the left hilum/tracheobronchial tree and measuring about 10.1 by 6.0 cm with maximum SUV 8.4. Left lower lobe mass 8.8 by 4.6 cm on image 113 series 3, centrally photopenic but high activity along the  margins with maximum SUV up to 4.4. Consolidation/drowned lung appearance in a substantial portion of the left upper lobe posteriorly with only very low-grade metabolic activity, maximum SUV 1.4. Anteriorly in the left lower lobe adjacent to the major fissure, a 2.9 by 1.9 cm nodule on image 131 series 3 is observed with maximum SUV 2.3. There has been some interval clearing of some of the consolidation anteriorly in the left upper lobe, with some mild residual nodularity in the left upper lobe which for the most part is without significant hypermetabolic activity. A bandlike region of volume loss in the lingula is present with maximum SUV of about 1.6. Some of the ground-glass density in the left lower lobe has intervally cleared. Small left pleural effusion without specific indicators of malignant pleural effusion. Incidental CT findings: none ABDOMEN/PELVIS: No significant abnormal hypermetabolic activity in this region. Incidental CT findings: Contracted gallbladder with questionable small gallstones. Aortoiliac atherosclerotic vascular disease. SKELETON: No significant abnormal hypermetabolic activity in this region. Incidental CT findings: Degenerative disc disease at L5-S1. IMPRESSION: 1. Central left hilar and subcarinal mass with metastatic disease to the left lower lobe and substantial mediastinal and bilateral hilar adenopathy as well as mild lower neck level V adenopathy on the left. Appearance favors T4 N3 M0 disease (assuming the left pleural effusion is not malignant) compatible with stage III C non-small cell lung cancer. 2. Improved appearance of consolidation in the left upper lobe. 3. Small left pleural effusion. 4. Suspected cholelithiasis. 5.  Aortic Atherosclerosis (ICD10-I70.0). Electronically Signed   By: Van Clines M.D.   On: 12/09/2020 09:10   DG Chest Port 1 View  Result Date: 11/30/2020 CLINICAL DATA:  Follow-up left pneumothorax after lung biopsy. EXAM: PORTABLE CHEST 1 VIEW  COMPARISON:  CTA chest and chest x-ray dated Nov 25, 2020. FINDINGS: Normal heart size. Unchanged large left lung mass and postobstructive pneumonia. The right lung remains clear. Unchanged small left pleural effusion. Trace left pneumothorax seen on post biopsy CT is not readily identified by x-ray. No acute osseous abnormality. IMPRESSION: 1. Trace left pneumothorax seen on post biopsy CT is not readily identified by x-ray. No enlarging pneumothorax. Electronically Signed   By: Titus Dubin M.D.   On: 11/30/2020 12:11   ECHOCARDIOGRAM COMPLETE  Result Date: 11/29/2020    ECHOCARDIOGRAM REPORT   Patient Name:   Aqib Lough. Date of Exam: 11/28/2020 Medical Rec #:  [id number]          Height:       68.0 in Accession #:    [id number]         Weight:       149.6 lb Date of Birth:  March 05, 1971           BSA:          1.806 m Patient Age:    65 years           BP:           115/80 mmHg Patient Gender: M                  HR:           106 bpm. Exam Location:  [facility] Procedure: 2D Echo, Cardiac Doppler and Color Doppler Indications:     Atrial Fibrillation I48.91  History:         Patient has no prior history of Echocardiogram examinations.  Sonographer:     Alyse Low Roar Referring Phys:  [place] Diagnosing Phys: Neoma Laming MD IMPRESSIONS  1. Left ventricular ejection fraction, by estimation, is 50 to 55%. The left ventricle has low normal function. The left ventricle demonstrates global hypokinesis. The left ventricular internal cavity size was mildly dilated. Left ventricular diastolic parameters are consistent with Grade I diastolic dysfunction (impaired relaxation).  2. Right ventricular systolic function is normal. The right ventricular size is moderately enlarged.  3. Left atrial size was moderately dilated.  4. Right atrial size was moderately dilated.  5. The mitral valve is normal in structure. Mild mitral valve regurgitation. No evidence of mitral stenosis.  6. The aortic valve is normal in  structure. Aortic valve regurgitation is not visualized. No aortic stenosis is present.  7. The inferior vena cava is normal in size with greater than 50% respiratory variability, suggesting right atrial pressure of 3 mmHg. FINDINGS  Left Ventricle: Left ventricular ejection fraction, by estimation, is 50 to 55%. The left ventricle has low normal function. The left ventricle demonstrates global hypokinesis. The left ventricular internal cavity size was mildly dilated. There is no left ventricular hypertrophy. Left ventricular diastolic parameters are consistent with Grade I diastolic dysfunction (impaired relaxation). Right Ventricle: The right ventricular size is moderately enlarged. No increase in right ventricular wall thickness. Right ventricular systolic function is normal. Left Atrium: Left atrial size was moderately dilated. Right Atrium: Right atrial size was moderately dilated. Pericardium: There is no evidence of pericardial effusion. Mitral Valve: The mitral valve is normal in structure. Mild mitral valve regurgitation. No evidence of mitral valve stenosis. Tricuspid Valve: The tricuspid valve is normal in structure. Tricuspid valve regurgitation is mild . No evidence of tricuspid stenosis. Aortic Valve: The aortic valve is normal in structure. Aortic valve regurgitation is not visualized. No  aortic stenosis is present. Aortic valve peak gradient measures 8.2 mmHg. Pulmonic Valve: The pulmonic valve was normal in structure. Pulmonic valve regurgitation is not visualized. No evidence of pulmonic stenosis. Aorta: The aortic root is normal in size and structure. Venous: The inferior vena cava is normal in size with greater than 50% respiratory variability, suggesting right atrial pressure of 3 mmHg. IAS/Shunts: No atrial level shunt detected by color flow Doppler.  LEFT VENTRICLE PLAX 2D LVIDd:         3.55 cm  Diastology LVIDs:         2.58 cm  LV e' medial:    10.00 cm/s LV PW:         0.97 cm  LV E/e'  medial:  11.6 LV IVS:        0.99 cm  LV e' lateral:   12.50 cm/s LVOT diam:     1.80 cm  LV E/e' lateral: 9.3 LVOT Area:     2.54 cm  RIGHT VENTRICLE RV Mid diam:    2.66 cm RV S prime:     16.30 cm/s TAPSE (M-mode): 2.0 cm LEFT ATRIUM             Index       RIGHT ATRIUM           Index LA diam:        3.30 cm 1.83 cm/m  RA Area:     12.00 cm LA Vol (A2C):   35.4 ml 19.60 ml/m RA Volume:   25.10 ml  13.89 ml/m LA Vol (A4C):   21.9 ml 12.12 ml/m LA Biplane Vol: 29.1 ml 16.11 ml/m  AORTIC VALVE                PULMONIC VALVE AV Area (Vmax): 1.92 cm    PV Vmax:        0.96 m/s AV Vmax:        143.00 cm/s PV Peak grad:   3.7 mmHg AV Peak Grad:   8.2 mmHg    RVOT Peak grad: 2 mmHg LVOT Vmax:      108.00 cm/s  AORTA Ao Root diam: 2.40 cm MITRAL VALVE MV Area (PHT): 5.97 cm     SHUNTS MV Decel Time: 127 msec     Systemic Diam: 1.80 cm MV E velocity: 116.00 cm/s MV A velocity: 70.80 cm/s MV E/A ratio:  1.64 MV A Prime:    6.8 cm/s Neoma Laming MD Electronically signed by Neoma Laming MD Signature Date/Time: 11/29/2020/9:01:47 AM    Final    [place] CORE BIOPSY (LYMPH NODES)  Result Date: 12/16/2020 INDICATION: Non-small cell lung cancer.  Multifocal lymphadenopathy. EXAM: ULTRASOUND GUIDED CORE NEEDLE BIOPSY OF ENLARGED LEFT NECK LYMPH NODE MEDICATIONS: None. ANESTHESIA/SEDATION: None PROCEDURE: The procedure, risks, benefits, and alternatives were explained to the patient. Questions regarding the procedure were encouraged and answered. The patient understands and consents to the procedure. The left neck skin was prepped with chlorhexidine in a sterile fashion, and a sterile drape was applied covering the operative field. A sterile gown and sterile gloves were used for the procedure. Local anesthesia was provided with 1% Lidocaine. Following local lidocaine administration, four 18 gauge cores were obtained from the enlarged left supraclavicular lymph node utilizing continuous ultrasound guidance. Samples were sent  to pathology in sterile saline. Needle removed and hemostasis achieved with 2 minutes of manual compression. Post procedure ultrasound images showed no evidence of significant hemorrhage. COMPLICATIONS: None immediate. IMPRESSION: Ultrasound-guided core needle biopsy  of enlarged left neck lymph node. Electronically Signed   By: Miachel Roux M.D.   On: 12/16/2020 15:43   IR IMAGING GUIDED PORT INSERTION  Result Date: 12/16/2020 INDICATION: Non-small cell lung cancer EXAM: IMPLANTED PORT A CATH PLACEMENT WITH ULTRASOUND AND FLUOROSCOPIC GUIDANCE MEDICATIONS: None ANESTHESIA/SEDATION: Moderate (conscious) sedation was employed during this procedure. A total of Versed 1 mg and Fentanyl 50 mcg was administered intravenously. Moderate Sedation Time: 18 minutes. The patient's level of consciousness and vital signs were monitored continuously by radiology nursing throughout the procedure under my direct supervision. FLUOROSCOPY TIME:  0.6 minutes (9.37 mGy) COMPLICATIONS: None immediate. PROCEDURE: The procedure, risks, benefits, and alternatives were explained to the patient. Questions regarding the procedure were encouraged and answered. The patient understands and consents to the procedure. A timeout was performed prior to the initiation of the procedure. Patient positioned supine on the angiography table. Right neck and anterior upper chest prepped and draped in the usual sterile fashion. All elements of maximal sterile barrier were utilized including, cap, mask, sterile gown, sterile gloves, large sterile drape, hand scrubbing and 2% Chlorhexidine for skin cleaning. The right internal jugular vein was evaluated with ultrasound and shown to be patent. A permanent ultrasound image was obtained and placed in the patient's medical record. Local anesthesia was provided with 1% lidocaine with epinephrine. Using sterile gel and a sterile probe cover, the right internal jugular vein was entered with a 21 ga needle during  real time ultrasound guidance. 0.018 inch guidewire placed and 21 ga needle exchanged for transitional dilator set. Utilizing fluoroscopy, 0.035 inch guidewire advanced through the needle without difficulty. Attention then turned to the right anterior upper chest. Following local lidocaine administration, a port pocket was created. The catheter was connected to the port and brought from the pocket to the venotomy site through a subcutaneous tunnel. The catheter was cut to size and inserted through the peel-away sheath. The catheter tip was positioned at the cavoatrial junction using fluoroscopic guidance. The port aspirated and flushed well. The port pocket was closed with deep and superficial absorbable suture. The port pocket incision and venotomy sites were also sealed with Dermabond. IMPRESSION: Successful placement of a right internal jugular approach power injectable Port-A-Cath. The catheter is ready for immediate use. Electronically Signed   By: Miachel Roux M.D.   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 The patient is not a candidate for bronchoscopy currently and request has been made for percutaneous biopsy of tumor in order to establish a diagnosis. EXAM: CT GUIDED CORE BIOPSY OF LEFT LOWER LOBE LUNG MASS ANESTHESIA/SEDATION: 1.0 mg IV Versed; 100 mcg IV Fentanyl Total Moderate Sedation Time:  19 minutes. The patient's level of consciousness and physiologic status were continuously monitored during the procedure by Radiology nursing. PROCEDURE: The procedure risks, benefits, and alternatives were explained to the patient. Questions regarding the procedure were encouraged and answered. The patient understands and consents to the procedure. A time-out was performed prior to initiating the procedure. CT was performed through the chest with the left  side rolled up in an oblique position. The left chest wall was prepped with chlorhexidine in a sterile fashion, and a sterile drape was applied covering the operative field. A sterile gown and sterile gloves were used for the procedure. Local anesthesia was provided with 1% Lidocaine. A  56 gauge trocar needle was advanced to the level of a left lower lobe lung mass. After confirming needle tip position, 3 separate coaxial 18 gauge core biopsy samples were obtained and submitted in formalin. The BioSentry device was utilized in depositing a plug at the pleural entry site. Additional CT imaging was performed. COMPLICATIONS: Tiny left lateral pneumothorax. FINDINGS: The large mass of the left lower lobe was targeted for sampling measuring approximately 8.9 cm in greatest diameter. Solid tissue was obtained with core biopsy. After the procedure CT demonstrates a tiny left lateral pneumothorax. IMPRESSION: CT-guided core biopsy of 9 cm left lower lobe lung mass. The procedure was complicated by a tiny left lateral pneumothorax which will be followed during recovery by chest x-ray. Electronically Signed   By: Aletta Edouard M.D.   On: 11/30/2020 11:05    ASSESSMENT & PLAN:   Lung mass #50 year old male patient with history of smoking is currently in the hospital for worsening left chest wall pain/shortness of breath; CT scan-left upper lobe lung mass/mediastinal adenopathy  # Left upper lobe mass/mediastinal adenopathy-highly concerning for lung malignancy.  Likely suggestive of stage III malignancy-however CT-guided biopsy-necrotic material/nondiagnostic.  Discussed with patient that he will need a PET scan.  We will also discuss with Dr.Aleskerov.   #Paroxysmal A. Fib-stable sinus rhythm.  # Chemotherapy education; port placement. Hopefully the planned start chemotherapy next 2 weeks. Antiemetics-Zofran and Compazine; EMLA cream sent to pharmacy  #Discussed regarding lack of insurance/referral to social  worker charity care.  # DISPOSITION: # labs- cbc/cmp/CEA today # MRI Brain # chemo ed ASAP [facility] carbo-taxol] # referal for port placement ASAP # referral to Dr.Chrystal re: Lung cancer # follow up in 2 [monday] weeks- MD; labs- cbc/cmp- carbo-Taxol weekly-dr.B  All questions were answered. The patient knows to call the clinic with any problems, questions or concerns.    Cammie Sickle, MD 12/22/2020 8:52 PM

## 2020-12-13 NOTE — Assessment & Plan Note (Addendum)
#  50 year old male patient with history of smoking is currently in the hospital for worsening left chest wall pain/shortness of breath; CT scan-left upper lobe lung mass/mediastinal adenopathy  # Left upper lobe mass/mediastinal adenopathy-highly concerning for lung malignancy.  Likely suggestive of stage III malignancy-however CT-guided biopsy-necrotic material/nondiagnostic.  Discussed with patient that he will need a PET scan.  We will also discuss with Dr.Aleskerov.   #Paroxysmal A. Fib-stable sinus rhythm.  # Chemotherapy education; port placement. Hopefully the planned start chemotherapy next 2 weeks. Antiemetics-Zofran and Compazine; EMLA cream sent to pharmacy  #Discussed regarding lack of insurance/referral to social worker charity care.  # DISPOSITION: # labs- cbc/cmp/CEA today # MRI Brain # chemo ed ASAP [facility] carbo-taxol] # referal for port placement ASAP # referral to Dr.Chrystal re: Lung cancer # follow up in 2 [monday] weeks- MD; labs- cbc/cmp- carbo-Taxol weekly-dr.B

## 2020-12-14 LAB — CEA: CEA: 1.6 ng/mL (ref 0.0–4.7)

## 2020-12-14 NOTE — Progress Notes (Signed)
Met with patient during follow up visit with Dr. Rogue Bussing to discuss recent imaging and next steps. All questions answered during visit. Introduced to navigator services. Contact info given and instructed to call with any questions or needs.   Phone call placed after visit to review upcoming appts. Spoke with pt's wife, Janace Hoard, who verbalized understanding. Nothing further needed at this time.

## 2020-12-14 NOTE — Progress Notes (Signed)
Patient on schedule for [facility] Pl[facility] along with Supraclavicular LN biopsy for 12/16/2020, called and spoke with patient on phone with pre procedure instructions given. Made aware to be here @ 1100 for procedures, NPO after MN prior to procedure as well as driver post procedure/recovery/discharge. Stated understanding.

## 2020-12-15 ENCOUNTER — Inpatient Hospital Stay: Payer: Medicaid Other

## 2020-12-15 ENCOUNTER — Other Ambulatory Visit: Payer: Self-pay

## 2020-12-15 ENCOUNTER — Other Ambulatory Visit: Payer: Self-pay | Admitting: Radiology

## 2020-12-15 NOTE — H&P (Signed)
Chief Complaint: Patient was seen in consultation today for port-a-catheter placement and lymph node biopsy.  Referring Physician(s): Cammie Sickle  Supervising Physician: Mir, Sharen Heck  Patient Status: [facility] - Out-pt  History of Present Illness: Curtis Manning. is a 50 y.o. male with a medical history significant for atrial fibrillation, asthma and a left lower lobe lung mass with adenopathy concerning for malignancy. He is familiar to IR from a thoracentesis 11/18/20 and a left lower lobe lung mass biopsy on 11/30/20. Pathology from the biopsy showed "abundant necrotic debris; insufficient viable tissue for diagnosis." Additional imaging obtained.  PET scan 12/08/20 CHEST: Hypermetabolic supraclavicular, paratracheal, prevascular, AP window, left paratracheal, left paraspinal, bilateral hilar, left infrahilar, lower paraesophageal, and subcarinal adenopathy noted. Right supraclavicular node 1.4 cm in short axis on image 66 series 3 with maximum SUV 6.7. Anterior AP window lymph node 2.8 cm in short axis on image 100 series 3, maximum SUV 9.0. Index right lower paratracheal lymph node 2.0 cm in short axis on image 99 series 3, maximum SUV 8.9. IMPRESSION: 1. Central left hilar and subcarinal mass with metastatic disease to the left lower lobe and substantial mediastinal and bilateral hilar adenopathy as well as mild lower neck level V adenopathy on the left. Appearance favors T4 N3 M0 disease (assuming the left pleural effusion is not malignant) compatible with stage III C non-small cell lung cancer. 2. Improved appearance of consolidation in the left upper lobe. 3. Small left pleural effusion. 4. Suspected cholelithiasis. 5.  Aortic Atherosclerosis (ICD10-I70.0).  Interventional Radiology has been asked to evaluate this patient for both port-a-catheter placement for chemotherapy and lymph node biopsy. This case was reviewed and procedure approved by Dr. Vernard Gambles.     Past Medical History:  Diagnosis Date   A-fib ([place])    Asthma    GERD (gastroesophageal reflux disease)    Lung mass    Pleural effusion     History reviewed. No pertinent surgical history.  Allergies: Patient has no known allergies.  Medications: Prior to Admission medications   Medication Sig Start Date End Date Taking? Authorizing Provider  albuterol (PROVENTIL) (2.5 MG/3ML) 0.083% nebulizer solution Inhale 3 mLs into the lungs every 6 (six) hours as needed. 04/20/13 10/27/21  [provider]  albuterol (VENTOLIN HFA) 108 (90 Base) MCG/ACT inhaler Inhale 1-2 puffs into the lungs every 4 (four) hours as needed for wheezing or shortness of breath. 11/14/20   Enzo Bi, MD  chlorpheniramine-HYDROcodone (TUSSIONEX) 10-8 MG/5ML SUER Take 5 mLs by mouth every 12 (twelve) hours as needed for cough. Patient not taking: Reported on 12/13/2020 11/09/20   [provider]  cholecalciferol (VITAMIN D) 25 MCG tablet Take 1 tablet (1,000 Units total) by mouth daily. Can take any over-the-counter. 11/14/20   Enzo Bi, MD  diltiazem (CARDIZEM CD) 240 MG 24 hr capsule Take 1 capsule (240 mg total) by mouth daily. 12/01/20   Lorella Nimrod, MD  feeding supplement (ENSURE ENLIVE / ENSURE PLUS) LIQD Take 237 mLs by mouth 3 (three) times daily between meals. 11/14/20   Enzo Bi, MD  ipratropium-albuterol (DUONEB) 0.5-2.5 (3) MG/3ML SOLN Take 3 mLs by nebulization every 6 (six) hours as needed. 11/14/20   Enzo Bi, MD  metoprolol tartrate (LOPRESSOR) 50 MG tablet Take 1 tablet (50 mg total) by mouth every 6 (six) hours. 12/01/20   Lorella Nimrod, MD  Multiple Vitamin (MULTIVITAMIN WITH MINERALS) TABS tablet Take 1 tablet by mouth daily. 11/15/20   Enzo Bi, MD  omeprazole ([place]) 10 MG  capsule Take 20 mg by mouth daily.    [provider]     Family History  Problem Relation Age of Onset   High blood pressure Mother    Heart Problems Father    Stomach cancer Paternal Grandmother      Social History   Socioeconomic History   Marital status: Married    Spouse name: Not on file   Number of children: Not on file   Years of education: Not on file   Highest education level: Not on file  Occupational History   Not on file  Tobacco Use   Smoking status: Former    Packs/day: 0.50    Years: 20.00    Pack years: 10.00    Types: Cigarettes    Quit date: 11/25/2020    Years since quitting: 0.0   Smokeless tobacco: Never  Vaping Use   Vaping Use: Never used  Substance and Sexual Activity   Alcohol use: Yes    Comment: "5 to 6 shots on weekends"   Drug use: Never   Sexual activity: Not on file  Other Topics Concern   Not on file  Social History Narrative   Patient is a active smoker.;  Also liquor.  Lives with his wife in [place].  No children.  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  All other systems are negative.  Review of Systems  Constitutional:  Positive for fatigue. Negative for appetite change.  Respiratory:  Positive for shortness of breath. Negative for cough.   Cardiovascular:  Negative for chest pain and leg swelling.  Gastrointestinal:  Negative for abdominal pain, diarrhea and nausea.  Neurological:  Positive for headaches. Negative for dizziness.   Vital Signs: BP 118/86   Pulse (!) 114   Temp 97.7 F (36.5 C) (Oral)   Resp (!) 23   Ht 5\' 9"  (1.753 m)   Wt 66.2 kg   SpO2 97%   BMI 21.56 kg/m   Physical Exam Constitutional:      General: He is not in acute distress. HENT:     Mouth/Throat:     Mouth: Mucous membranes are moist.     Pharynx: Oropharynx is clear.  Cardiovascular:     Rate and Rhythm: Regular rhythm.  Tachycardia present.  Pulmonary:     Effort: Pulmonary effort is normal.     Breath sounds: Normal breath sounds.  Abdominal:     General: Bowel sounds are normal.     Palpations: Abdomen is soft.  Musculoskeletal:     Cervical back: Normal range of motion.  Skin:    General: Skin is warm and dry.  Neurological:     Mental Status: He is alert and oriented to person, place, and time.    Imaging: DG Chest 2 View  Result Date: 12/01/2020 CLINICAL DATA:  Status post biopsy EXAM: CHEST - 2 VIEW COMPARISON:  November 30, 2020 FINDINGS: No pneumothorax is appreciable by radiography. There is a persistent residual pleural effusion on the left. Apparent masses in the left upper and left lower lobe regions with surrounding pneumonitis remains stable. Right lung is clear. Heart size and pulmonary vascularity  normal. No adenopathy. No bone lesions. IMPRESSION: No pneumothorax evident by radiography. Mass lesions with surrounding pneumonitis on the left remain essentially stable with small left pleural effusion also noted. No new opacity evident. Right lung clear. Heart size normal. Electronically Signed   By: Lowella Grip III M.D.   On: 12/01/2020 09:37   DG Chest 2 View  Result Date: 11/25/2020 CLINICAL DATA:  Chest pain and shortness of breath EXAM: CHEST - 2 VIEW COMPARISON:  Chest radiograph 11/18/2020, chest CT 11/11/2020 FINDINGS: Unchanged cardiomediastinal silhouette. There is increased opacification of the left upper lung, with unchanged appearance of the left perihilar and left lower lobe mass. Small left pleural effusion. No visible pneumothorax. IMPRESSION: Increased opacification of the left upper lung with known left perihilar and left lower lobe masses, consistent with worsening postobstructive pneumonia. Small left pleural effusion. Electronically Signed   By: Maurine Simmering   On: 11/25/2020 19:04   CT Angio Chest PE W and/or Wo Contrast  Result Date: 11/25/2020 CLINICAL DATA:  History of  pneumonia left-sided chest pain EXAM: CT ANGIOGRAPHY CHEST WITH CONTRAST TECHNIQUE: Multidetector CT imaging of the chest was performed using the standard protocol during bolus administration of intravenous contrast. Multiplanar CT image reconstructions and MIPs were obtained to evaluate the vascular anatomy. CONTRAST:  27mL OMNIPAQUE IOHEXOL 350 MG/ML SOLN COMPARISON:  Chest x-ray 11/25/2020, CT chest 11/11/2020 FINDINGS: Cardiovascular: Satisfactory opacification of the pulmonary arteries to the segmental level. No evidence of pulmonary embolism. Encasement and diffuse narrowing of left-sided pulmonary vasculature secondary to lung mass. Normal cardiac size. Trace pericardial effusion. Mediastinum/Nodes: No thyroid mass. Mild narrowing of left mainstem bronchus with occlusion of left upper and lower lobe bronchi by tumor. Redemonstrated bulky mediastinal and left hilar adenopathy. Index prevascular node measuring 2.1 cm, 2.2 cm previously. Conglomerate subcarinal and left hilar adenopathy versus tumor infiltration. Enlarged left cardio phrenic node up to 8 mm, previously 7 mm. Esophageal displacement to the right by mediastinal adenopathy/mass, esophagus cannot be clearly separated from bulky mediastinal mass. Distal esophageal nodes measuring up to 12 mm. Lungs/Pleura: Moderate partially loculated left pleural effusion as before. Large poorly defined medial left upper lobe and left hilar lung mass with mediastinal invasion and or poorly defined adenopathy. Large presumed metastatic mass mostly within the left lower lobe, measuring 8.7 by 5.8 cm, previously 7.5 x 4.1 cm. Subpleural anterior left lower lobe lung mass measures 2.9 x 1.8 cm, previously 2.6 x 1.2 cm, series 6, image 78. Extensive ground-glass density and patchy consolidation in the left upper lobe consistent with postobstructive pneumonia. Mild nodularity along the major fissure. Slightly worsened ground-glass density in the left lower lobe and  surrounding the dominant left lower lobe lung mass. New or increased irregular nodularity in the left lateral lung base, series 6, image 77. Upper Abdomen: No acute abnormality. Subcentimeter low-density lesions in the liver too small to further characterize. Musculoskeletal: No acute osseous abnormality Review of the MIP images confirms the above findings. IMPRESSION: 1. Negative for acute pulmonary embolus. Diffuse narrowing and encasement of left-sided pulmonary vasculature by large lung mass. 2. Large poorly defined left upper lobe and hilar lung mass with bulky mediastinal and left hilar metastatic disease. Possible mediastinal invasion by large left upper lobe lung mass. Occlusion of left upper and lower lobe bronchi by tumor. Metastatic lesions in the left lower lobe measuring up to 8.7 cm, increased compared to prior CT. Slight worsening of diffuse ground-glass density in the left lower lobe, likely worsening postobstructive pneumonia. Continued  extensive consolidation and ground-glass densities in the left upper lobe, also consistent with postobstructive pneumonia. Redemonstrated fissural nodularity in the left upper lobe raising concern for metastatic disease. Moderate partially loculated left-sided pleural effusion. Electronically Signed   By: Donavan Foil M.D.   On: 11/25/2020 21:04   MR Brain W Wo Contrast  Result Date: 12/16/2020 CLINICAL DATA:  Lung mass.  Evaluation for metastatic disease. EXAM: MRI HEAD WITHOUT AND WITH CONTRAST TECHNIQUE: Multiplanar, multiecho pulse sequences of the brain and surrounding structures were obtained without and with intravenous contrast. CONTRAST:  72mL GADAVIST GADOBUTROL 1 MMOL/ML IV SOLN COMPARISON:  None. FINDINGS: Brain: There is no evidence of an acute infarct, intracranial hemorrhage, mass, midline shift, or extra-axial fluid collection. The ventricles and sulci are normal. The brain is normal in signal. No abnormal brain parenchymal or meningeal  enhancement is identified. A small developmental venous anomaly is incidentally noted laterally in the right cerebellar hemisphere. Vascular: Major intracranial vascular flow voids are preserved. Skull and upper cervical spine: Unremarkable bone marrow signal. Sinuses/Orbits: Unremarkable orbits. Minimal mucosal thickening in the left maxillary sinus. Trace bilateral mastoid fluid. Other: Small right frontal scalp lipoma. IMPRESSION: Unremarkable appearance of the brain. No evidence of intracranial metastases. Electronically Signed   By: Logan Bores M.D.   On: 12/16/2020 12:06   NM PET Image Initial (PI) Skull Base To Thigh  Result Date: 12/09/2020 CLINICAL DATA:  Initial treatment strategy for non-small cell lung cancer. EXAM: NUCLEAR MEDICINE PET SKULL BASE TO THIGH TECHNIQUE: 7.7 mCi F-18 FDG was injected intravenously. Full-ring PET imaging was performed from the skull base to thigh after the radiotracer. CT data was obtained and used for attenuation correction and anatomic localization. Fasting blood glucose: 121 mg/dl COMPARISON:  CT chest 11/25/2020 FINDINGS: Mediastinal blood pool activity: SUV max 1.4 Liver activity: SUV max NA NECK: A 0.7 cm left level V lymph node on image 60 of series 3 has a maximum SUV of 6.0. Additional lymph nodes straddle between level IV and the supraclavicular region, and will be counted as supraclavicular nodes below. Incidental CT findings: none CHEST: Hypermetabolic supraclavicular, paratracheal, prevascular, AP window, left paratracheal, left paraspinal, bilateral hilar, left infrahilar, lower paraesophageal, and subcarinal adenopathy noted. Right supraclavicular node 1.4 cm in short axis on image 66 series 3 with maximum SUV 6.7. Anterior AP window lymph node 2.8 cm in short axis on image 100 series 3, maximum SUV 9.0. Index right lower paratracheal lymph node 2.0 cm in short axis on image 99 series 3, maximum SUV 8.9. Subcarinal mass noted involving the left  hilum/tracheobronchial tree and measuring about 10.1 by 6.0 cm with maximum SUV 8.4. Left lower lobe mass 8.8 by 4.6 cm on image 113 series 3, centrally photopenic but high activity along the margins with maximum SUV up to 4.4. Consolidation/drowned lung appearance in a substantial portion of the left upper lobe posteriorly with only very low-grade metabolic activity, maximum SUV 1.4. Anteriorly in the left lower lobe adjacent to the major fissure, a 2.9 by 1.9 cm nodule on image 131 series 3 is observed with maximum SUV 2.3. There has been some interval clearing of some of the consolidation anteriorly in the left upper lobe, with some mild residual nodularity in the left upper lobe which for the most part is without significant hypermetabolic activity. A bandlike region of volume loss in the lingula is present with maximum SUV of about 1.6. Some of the ground-glass density in the left lower lobe has intervally cleared. Small left  pleural effusion without specific indicators of malignant pleural effusion. Incidental CT findings: none ABDOMEN/PELVIS: No significant abnormal hypermetabolic activity in this region. Incidental CT findings: Contracted gallbladder with questionable small gallstones. Aortoiliac atherosclerotic vascular disease. SKELETON: No significant abnormal hypermetabolic activity in this region. Incidental CT findings: Degenerative disc disease at L5-S1. IMPRESSION: 1. Central left hilar and subcarinal mass with metastatic disease to the left lower lobe and substantial mediastinal and bilateral hilar adenopathy as well as mild lower neck level V adenopathy on the left. Appearance favors T4 N3 M0 disease (assuming the left pleural effusion is not malignant) compatible with stage III C non-small cell lung cancer. 2. Improved appearance of consolidation in the left upper lobe. 3. Small left pleural effusion. 4. Suspected cholelithiasis. 5.  Aortic Atherosclerosis (ICD10-I70.0). Electronically Signed    By: Van Clines M.D.   On: 12/09/2020 09:10   DG Chest Port 1 View  Result Date: 11/30/2020 CLINICAL DATA:  Follow-up left pneumothorax after lung biopsy. EXAM: PORTABLE CHEST 1 VIEW COMPARISON:  CTA chest and chest x-ray dated Nov 25, 2020. FINDINGS: Normal heart size. Unchanged large left lung mass and postobstructive pneumonia. The right lung remains clear. Unchanged small left pleural effusion. Trace left pneumothorax seen on post biopsy CT is not readily identified by x-ray. No acute osseous abnormality. IMPRESSION: 1. Trace left pneumothorax seen on post biopsy CT is not readily identified by x-ray. No enlarging pneumothorax. Electronically Signed   By: Titus Dubin M.D.   On: 11/30/2020 12:11   DG Chest Port 1 View  Result Date: 11/18/2020 CLINICAL DATA:  50 year old male with history of left lung mass and left pleural effusion. EXAM: PORTABLE CHEST - 1 VIEW COMPARISON:  11/11/2020 FINDINGS: The mediastinal contours partially obscured at the AP window secondary to pulmonary mass. No evidence of cardiomegaly. Resolution of previously visualized small left pleural effusion. No evidence of pneumothorax. Similar appearing left mid lung solid mass measuring up to approximately 7.5 cm. 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 The right lung is clear. No acute osseous abnormality. IMPRESSION: 1. Resolution of left pleural effusion after left thoracentesis. No evidence of pneumothorax. 2. Similar appearing solid mass in the superior segment left lower lobe, left hilar lymphadenopathy, and left upper lobe consolidative opacities. Ruthann Cancer, MD [facility] Radiology Electronically Signed   By: Ruthann Cancer MD   On: 11/18/2020 15:20   ECHOCARDIOGRAM COMPLETE  Result Date: 11/29/2020    ECHOCARDIOGRAM REPORT   Patient Name:   Deantae Shackleton. Date of Exam: 11/28/2020 Medical Rec #:  [id number]          Height:       68.0 in Accession #:    [id number]         Weight:       149.6 lb Date of Birth:  1970/07/20           BSA:          1.806 m Patient Age:    49 years           BP:           115/80 mmHg Patient Gender: M                  HR:           106 bpm. Exam Location:  [facility] Procedure: 2D Echo, Cardiac Doppler and Color Doppler Indications:  Atrial Fibrillation I48.91  History:         Patient has no prior history of Echocardiogram examinations.  Sonographer:     Alyse Low Roar Referring Phys:  [place] Diagnosing Phys: Neoma Laming MD IMPRESSIONS  1. Left ventricular ejection fraction, by estimation, is 50 to 55%. The left ventricle has low normal function. The left ventricle demonstrates global hypokinesis. The left ventricular internal cavity size was mildly dilated. Left ventricular diastolic parameters are consistent with Grade I diastolic dysfunction (impaired relaxation).  2. Right ventricular systolic function is normal. The right ventricular size is moderately enlarged.  3. Left atrial size was moderately dilated.  4. Right atrial size was moderately dilated.  5. The mitral valve is normal in structure. Mild mitral valve regurgitation. No evidence of mitral stenosis.  6. The aortic valve is normal in structure. Aortic valve regurgitation is not visualized. No aortic stenosis is present.  7. The inferior vena cava is normal in size with greater than 50% respiratory variability, suggesting right atrial pressure of 3 mmHg. FINDINGS  Left Ventricle: Left ventricular ejection fraction, by estimation, is 50 to 55%. The left ventricle has low normal function. The left ventricle demonstrates global hypokinesis. The left ventricular internal cavity size was mildly dilated. There is no left ventricular hypertrophy. Left ventricular diastolic parameters are consistent with Grade I diastolic dysfunction (impaired relaxation). Right  Ventricle: The right ventricular size is moderately enlarged. No increase in right ventricular wall thickness. Right ventricular systolic function is normal. Left Atrium: Left atrial size was moderately dilated. Right Atrium: Right atrial size was moderately dilated. Pericardium: There is no evidence of pericardial effusion. Mitral Valve: The mitral valve is normal in structure. Mild mitral valve regurgitation. No evidence of mitral valve stenosis. Tricuspid Valve: The tricuspid valve is normal in structure. Tricuspid valve regurgitation is mild . No evidence of tricuspid stenosis. Aortic Valve: The aortic valve is normal in structure. Aortic valve regurgitation is not visualized. No aortic stenosis is present. Aortic valve peak gradient measures 8.2 mmHg. Pulmonic Valve: The pulmonic valve was normal in structure. Pulmonic valve regurgitation is not visualized. No evidence of pulmonic stenosis. Aorta: The aortic root is normal in size and structure. Venous: The inferior vena cava is normal in size with greater than 50% respiratory variability, suggesting right atrial pressure of 3 mmHg. IAS/Shunts: No atrial level shunt detected by color flow Doppler.  LEFT VENTRICLE PLAX 2D LVIDd:         3.55 cm  Diastology LVIDs:         2.58 cm  LV e' medial:    10.00 cm/s LV PW:         0.97 cm  LV E/e' medial:  11.6 LV IVS:        0.99 cm  LV e' lateral:   12.50 cm/s LVOT diam:     1.80 cm  LV E/e' lateral: 9.3 LVOT Area:     2.54 cm  RIGHT VENTRICLE RV Mid diam:    2.66 cm RV S prime:     16.30 cm/s TAPSE (M-mode): 2.0 cm LEFT ATRIUM             Index       RIGHT ATRIUM           Index LA diam:        3.30 cm 1.83 cm/m  RA Area:     12.00 cm LA Vol (A2C):   35.4 ml 19.60 ml/m RA Volume:   25.10  ml  13.89 ml/m LA Vol (A4C):   21.9 ml 12.12 ml/m LA Biplane Vol: 29.1 ml 16.11 ml/m  AORTIC VALVE                PULMONIC VALVE AV Area (Vmax): 1.92 cm    PV Vmax:        0.96 m/s AV Vmax:        143.00 cm/s PV Peak grad:    3.7 mmHg AV Peak Grad:   8.2 mmHg    RVOT Peak grad: 2 mmHg LVOT Vmax:      108.00 cm/s  AORTA Ao Root diam: 2.40 cm MITRAL VALVE MV Area (PHT): 5.97 cm     SHUNTS MV Decel Time: 127 msec     Systemic Diam: 1.80 cm MV E velocity: 116.00 cm/s MV A velocity: 70.80 cm/s MV E/A ratio:  1.64 MV A Prime:    6.8 cm/s Neoma Laming MD Electronically signed by Neoma Laming MD Signature Date/Time: 11/29/2020/9:01:47 AM    Final    CT LUNG MASS BIOPSY  Result Date: 11/30/2020 CLINICAL DATA:  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 The patient is not a candidate for bronchoscopy currently and request has been made for percutaneous biopsy of tumor in order to establish a diagnosis. EXAM: CT GUIDED CORE BIOPSY OF LEFT LOWER LOBE LUNG MASS ANESTHESIA/SEDATION: 1.0 mg IV Versed; 100 mcg IV Fentanyl Total Moderate Sedation Time:  19 minutes. The patient's level of consciousness and physiologic status were continuously monitored during the procedure by Radiology nursing. PROCEDURE: The procedure risks, benefits, and alternatives were explained to the patient. Questions regarding the procedure were encouraged and answered. The patient understands and consents to the procedure. A time-out was performed prior to initiating the procedure. CT was performed through the chest with the left side rolled up in an oblique position. The left chest wall was prepped with chlorhexidine in a sterile fashion, and a sterile drape was applied covering the operative field. A sterile gown and sterile gloves were used for the procedure. Local anesthesia was provided with 1% Lidocaine. A 17 gauge trocar needle was advanced to the level of a left lower lobe lung mass. After confirming needle tip position, 3 separate coaxial 18 gauge core biopsy samples were obtained and submitted in formalin. The BioSentry device was utilized in depositing a plug at the pleural entry site. Additional  CT imaging was performed. COMPLICATIONS: Tiny left lateral pneumothorax. FINDINGS: The large mass of the left lower lobe was targeted for sampling measuring approximately 8.9 cm in greatest diameter. Solid tissue was obtained with core biopsy. After the procedure CT demonstrates a tiny left lateral pneumothorax. IMPRESSION: CT-guided core biopsy of 9 cm left lower lobe lung mass. The procedure was complicated by a tiny left lateral pneumothorax which will be followed during recovery by chest x-ray. Electronically Signed   By: Aletta Edouard M.D.   On: 11/30/2020 11:05   US THORACENTESIS ASP PLEURAL SPACE W/IMG GUIDE  Result Date: 11/18/2020 INDICATION: Patient with history of respiratory distress found to have a lung mass with a left-sided pleural effusion. Request is for therapeutic and diagnostic thoracentesis EXAM: ULTRASOUND GUIDED LEFT-SIDED THERAPEUTIC AND DIAGNOSTIC THORACENTESIS MEDICATIONS: Lidocaine 1% 10 mL COMPLICATIONS: None immediate. PROCEDURE: An ultrasound guided thoracentesis was thoroughly discussed with the patient and questions answered. The benefits, risks, alternatives and complications were also discussed. The patient understands and wishes to proceed with the procedure.  Written consent was obtained. Ultrasound was performed to localize and mark an adequate pocket of fluid in the left chest. The area was then prepped and draped in the normal sterile fashion. 1% Lidocaine was used for local anesthesia. Under ultrasound guidance a catheter was introduced. Thoracentesis was performed. The catheter was removed and a dressing applied. FINDINGS: A total of approximately 600 mL of dark amber fluid was removed. Samples were sent to the laboratory as requested by the clinical team. The pleural effusion was noted to be loculated. IMPRESSION: Successful ultrasound guided left-sided therapeutic and diagnostic thoracentesis yielding 600 mL of pleural fluid. Read by: Rushie Nyhan, NP  Electronically Signed   By: Ruthann Cancer MD   On: 11/18/2020 15:20    Labs:  CBC: Recent Labs    11/26/20 0177 11/28/20 0651 11/30/20 0601 12/13/20 1216  WBC 22.8* 16.7* 15.9* 18.4*  HGB 12.7* 10.7* 12.2* 14.2  HCT 37.4* 31.3* 36.0* 42.3  PLT 264 251 355 220    COAGS: Recent Labs    11/25/20 2016  INR 1.1  APTT 38*    BMP: Recent Labs    11/26/20 0927 11/28/20 0651 11/30/20 0601 12/13/20 1216  NA 131* 139 138 134*  K 4.3 4.0 4.0 4.3  CL 95* 103 99 100  CO2 23 26 29 26   GLUCOSE 111* 205* 118* 97  BUN 10 14 13  22*  CALCIUM 8.7* 8.4* 8.8* 8.5*  CREATININE 0.59* 0.49* 0.60* 0.63  GFRNONAA >60 >60 >60 >60    LIVER FUNCTION TESTS: Recent Labs    11/11/20 1702 12/13/20 1216  BILITOT 0.5 0.8  AST 25 22  ALT 13 22  ALKPHOS 76 70  PROT 6.9 6.1*  ALBUMIN 2.9* 3.3*    TUMOR MARKERS: No results for input(s): AFPTM, CEA, CA199, CHROMGRNA in the last 8760 hours.  Assessment and Plan:  Suspected non-small cell lung cancer with adenopathy; pending chemotherapy: Curtis Pyo., 50 year old male, presents today to the [facility] Interventional Radiology department for an image-guided port-a-catheter placement and an image-guided lymph node biopsy.  Risks and benefits of image-guided port-a-catheter placement were discussed with the patient including, but not limited to bleeding, infection, pneumothorax, or fibrin sheath development and need for additional procedures.  Risks and benefits of lymph node biopsy were discussed with the patient and/or patient's family including, but not limited to bleeding, infection, damage to adjacent structures or low yield requiring additional tests.  All of the questions were answered and there is agreement to proceed. He has been NPO. Labs and vitals have been reviewed.   Consent signed and in chart.  Thank you for this interesting consult.  I greatly enjoyed meeting Curtis Manning. and look forward  to participating in their care.  A copy of this report was sent to the requesting provider on this date.  Electronically Signed: Soyla Dryer, AGACNP-BC [phone] 12/16/2020, 12:22 PM   I spent a total of  30 Minutes   in face to face in clinical consultation, greater than 50% of which was counseling/coordinating care for port-a-catheter placement and lymph node biopsy

## 2020-12-16 ENCOUNTER — Other Ambulatory Visit: Payer: Self-pay

## 2020-12-16 ENCOUNTER — Ambulatory Visit
Admission: RE | Admit: 2020-12-16 | Discharge: 2020-12-16 | Disposition: A | Discharge status: Home / Self Care | Payer: Medicaid Other | Source: Ambulatory Visit | Attending: Internal Medicine | Admitting: Internal Medicine

## 2020-12-16 ENCOUNTER — Telehealth: Payer: Self-pay | Admitting: Internal Medicine

## 2020-12-16 ENCOUNTER — Encounter: Payer: Self-pay | Admitting: *Deleted

## 2020-12-16 DIAGNOSIS — R590 Localized enlarged lymph nodes: Secondary | ICD-10-CM

## 2020-12-16 DIAGNOSIS — R918 Other nonspecific abnormal finding of lung field: Secondary | ICD-10-CM | POA: Insufficient documentation

## 2020-12-16 DIAGNOSIS — C8511 Unspecified B-cell lymphoma, lymph nodes of head, face, and neck: Secondary | ICD-10-CM | POA: Insufficient documentation

## 2020-12-16 HISTORY — PX: IR IMAGING GUIDED PORT INSERTION: IMG5740

## 2020-12-16 LAB — PROTIME-INR
INR: 1 (ref 0.8–1.2)
Prothrombin Time: 12.7 seconds (ref 11.4–15.2)

## 2020-12-16 LAB — CBC
HCT: 37.8 % — ABNORMAL LOW (ref 39.0–52.0)
Hemoglobin: 12.9 g/dL — ABNORMAL LOW (ref 13.0–17.0)
MCH: 31.5 pg (ref 26.0–34.0)
MCHC: 34.1 g/dL (ref 30.0–36.0)
MCV: 92.2 fL (ref 80.0–100.0)
Platelets: 125 10*3/uL — ABNORMAL LOW (ref 150–400)
RBC: 4.1 MIL/uL — ABNORMAL LOW (ref 4.22–5.81)
RDW: 15.3 % (ref 11.5–15.5)
WBC: 12.7 10*3/uL — ABNORMAL HIGH (ref 4.0–10.5)
nRBC: 0 % (ref 0.0–0.2)

## 2020-12-16 MED ORDER — FENTANYL CITRATE (PF) 100 MCG/2ML IJ SOLN
INTRAMUSCULAR | Status: AC | PRN
  Administered 2020-12-16: 50 ug via INTRAVENOUS

## 2020-12-16 MED ORDER — FENTANYL CITRATE (PF) 100 MCG/2ML IJ SOLN
INTRAMUSCULAR | Status: AC
  Filled 2020-12-16: qty 2

## 2020-12-16 MED ORDER — SODIUM CHLORIDE 0.9 % IV SOLN: INTRAVENOUS | Status: DC

## 2020-12-16 MED ORDER — MIDAZOLAM HCL 5 MG/5ML IJ SOLN
INTRAMUSCULAR | Status: AC | PRN
  Administered 2020-12-16: 1 mg via INTRAVENOUS

## 2020-12-16 MED ORDER — MIDAZOLAM HCL 5 MG/5ML IJ SOLN
INTRAMUSCULAR | Status: AC
  Filled 2020-12-16: qty 5

## 2020-12-16 MED ORDER — GADOBUTROL 1 MMOL/ML IV SOLN
6.0000 mL | Freq: Once | INTRAVENOUS | Status: AC | PRN
  Administered 2020-12-16: 6 mL via INTRAVENOUS

## 2020-12-16 MED ORDER — HEPARIN SOD (PORK) LOCK FLUSH 100 UNIT/ML IV SOLN
INTRAVENOUS | Status: AC
  Filled 2020-12-16: qty 5

## 2020-12-16 NOTE — Discharge Instructions (Signed)

## 2020-12-16 NOTE — Procedures (Signed)
Interventional Radiology Procedure Note  Procedure: US guided left neck lymph node biopsy and Chest port  Indication: Lymphadenopathy  Findings: Please refer to procedural dictation for full description.  Complications: None  EBL: < 10 mL  Miachel Roux, MD [phone]

## 2020-12-16 NOTE — Progress Notes (Signed)
Tumor Board Documentation  Wilber Fini. was presented by Dr Rogue Bussing at our Tumor Board on 12/15/2020, which included representatives from medical oncology, radiation oncology, internal medicine, navigation, pathology, radiology, surgical, pharmacy, research, pulmonology.  Fong currently presents as a current patient, for [place] with history of the following treatments: active survellience, surgical intervention(s).  Additionally, we reviewed previous medical and familial history, history of present illness, and recent lab results along with all available histopathologic and imaging studies. The tumor board considered available treatment options and made the following recommendations: Biopsy (Interventional Radiology to re biopsy)    The following procedures/referrals were also placed: No orders of the defined types were placed in this encounter.   Clinical Trial Status: not discussed   Staging used: To be determined AJCC Staging:       Group: Left Lower Lobe Lung Mass   National site-specific guidelines   were discussed with respect to the case.  Tumor board is a meeting of clinicians from various specialty areas who evaluate and discuss patients for whom a multidisciplinary approach is being considered. Final determinations in the plan of care are those of the provider(s). The responsibility for follow up of recommendations given during tumor board is that of the provider.   Today's extended care, comprehensive team conference, [place] was not present for the discussion and was not examined.   Multidisciplinary Tumor Board is a multidisciplinary case peer review process.  Decisions discussed in the Multidisciplinary Tumor Board reflect the opinions of the specialists present at the conference without having examined the patient.  Ultimately, treatment and diagnostic decisions rest with the primary provider(s) and the patient.

## 2020-12-16 NOTE — Telephone Encounter (Signed)
On 6/17-spoke to patient's wife, Janace Hoard- regarding results of MRI brain negative for malignancy.  Recommend Tylenol-Motrin [1 pill each] every 8 hours for the next 2 days; if not improved call us for further intervention.  GB

## 2020-12-19 ENCOUNTER — Telehealth: Payer: Self-pay | Admitting: *Deleted

## 2020-12-19 ENCOUNTER — Ambulatory Visit
Admission: RE | Admit: 2020-12-19 | Discharge: 2020-12-19 | Disposition: A | Discharge status: Home / Self Care | Payer: Medicaid Other | Source: Ambulatory Visit | Attending: Radiation Oncology | Admitting: Radiation Oncology

## 2020-12-19 ENCOUNTER — Encounter: Payer: Self-pay | Admitting: *Deleted

## 2020-12-19 ENCOUNTER — Encounter: Payer: Self-pay | Admitting: Radiation Oncology

## 2020-12-19 VITALS — BP 125/76 | HR 128 | Temp 97.1°F | Wt 146.6 lb

## 2020-12-19 DIAGNOSIS — Z87891 Personal history of nicotine dependence: Secondary | ICD-10-CM | POA: Insufficient documentation

## 2020-12-19 DIAGNOSIS — K219 Gastro-esophageal reflux disease without esophagitis: Secondary | ICD-10-CM | POA: Diagnosis not present

## 2020-12-19 DIAGNOSIS — Z79899 Other long term (current) drug therapy: Secondary | ICD-10-CM | POA: Diagnosis not present

## 2020-12-19 DIAGNOSIS — J45909 Unspecified asthma, uncomplicated: Secondary | ICD-10-CM | POA: Diagnosis not present

## 2020-12-19 DIAGNOSIS — R918 Other nonspecific abnormal finding of lung field: Secondary | ICD-10-CM | POA: Diagnosis not present

## 2020-12-19 DIAGNOSIS — I4891 Unspecified atrial fibrillation: Secondary | ICD-10-CM | POA: Diagnosis not present

## 2020-12-19 NOTE — Consult Note (Signed)
NEW PATIENT EVALUATION  Name: Curtis Manning.  MRN: [id number]  Date:   12/19/2020     DOB: [birth year]   This 50 y.o. male patient presents to the clinic for initial evaluation of probable 3C (T4 N3 M0) non-small cell lung cancer of the left lung pending tissue confirmation.  REFERRING PHYSICIAN: Cammie Sickle, *  CHIEF COMPLAINT:  Chief Complaint  Patient presents with   Lung Mass    DIAGNOSIS: The encounter diagnosis was Lung mass.   PREVIOUS INVESTIGATIONS:  PET scan MRI brain reviewed as well as CT scans. Pathology report reviewed Clinical notes reviewed  HPI: Patient is a 50 year old male meant admitted to the hospital for worsening left chest wall pain shortness of breath.  CT scan confirmed a left upper lobe mass with mediastinal adenopathy.  He had a PET CT scan showing central left hilar and subcarinal mass with metastatic disease to the left lower lobe substernal mediastinal and bilateral hilar adenopathy as well as mild lower neck level 5 adenopathy.  This favors a T4N3 non-small cell lung cancer.  Patient underwent a CT-guided biopsy of the left lung mass which only has a rim of hypermetabolic activity in the periphery and tissue is not confirmatory of malignancy.  He is now status post a left supraclavicular nodal biopsy by interventional radiology with pathology pending.  Case has been presented at our tumor conference.  MRI of his brain showed no evidence of metastatic disease.He has been seen by medical oncology is planned on having a port placed for concurrent chemoradiation.  He is seen today for radiation oncology opinion he feels weak with a poor appetite has occasional cough he did have some hemoptysis although that is improved.  PLANNED TREATMENT REGIMEN: IMRT radiation therapy to his chest with concurrent chemotherapy  PAST MEDICAL HISTORY:  has a past medical history of A-fib ([place]), Asthma, GERD (gastroesophageal reflux disease), Lung mass, and Pleural  effusion.    PAST SURGICAL HISTORY:  Past Surgical History:  Procedure Laterality Date   IR IMAGING GUIDED PORT INSERTION  12/16/2020    FAMILY HISTORY: family history includes Heart Problems in his father; High blood pressure in his mother; Stomach cancer in his paternal grandmother.  SOCIAL HISTORY:  reports that he quit smoking about 3 weeks ago. His smoking use included cigarettes. He has a 10.00 pack-year smoking history. He has never used smokeless tobacco. He reports current alcohol use. He reports that he does not use drugs.  ALLERGIES: Patient has no known allergies.  MEDICATIONS:  Current Outpatient Medications  Medication Sig Dispense Refill   albuterol (PROVENTIL) (2.5 MG/3ML) 0.083% nebulizer solution Inhale 3 mLs into the lungs every 6 (six) hours as needed.     albuterol (VENTOLIN HFA) 108 (90 Base) MCG/ACT inhaler Inhale 1-2 puffs into the lungs every 4 (four) hours as needed for wheezing or shortness of breath. 1 each 2   cholecalciferol (VITAMIN D) 25 MCG tablet Take 1 tablet (1,000 Units total) by mouth daily. Can take any over-the-counter.     diltiazem (CARDIZEM CD) 240 MG 24 hr capsule Take 1 capsule (240 mg total) by mouth daily. 30 capsule 0   feeding supplement (ENSURE ENLIVE / ENSURE PLUS) LIQD Take 237 mLs by mouth 3 (three) times daily between meals. 237 mL 12   HYDROcodone-acetaminophen (NORCO/VICODIN) 5-325 MG tablet Take 1 tablet by mouth every 6 (six) hours as needed.     ipratropium-albuterol (DUONEB) 0.5-2.5 (3) MG/3ML SOLN Take 3 mLs by nebulization every 6 (six)  hours as needed. 75 mL 2   metoprolol tartrate (LOPRESSOR) 50 MG tablet Take 1 tablet (50 mg total) by mouth every 6 (six) hours. 120 tablet 0   Multiple Vitamin (MULTIVITAMIN WITH MINERALS) TABS tablet Take 1 tablet by mouth daily.     omeprazole (PRILOSEC) 10 MG capsule Take 20 mg by mouth daily.     chlorpheniramine-HYDROcodone (TUSSIONEX) 10-8 MG/5ML SUER Take 5 mLs by mouth every 12 (twelve)  hours as needed for cough. (Patient not taking: No sig reported)     No current facility-administered medications for this encounter.    ECOG PERFORMANCE STATUS:  1 - Symptomatic but completely ambulatory  REVIEW OF SYSTEMS: Patient denies any weight loss, fatigue, weakness, fever, chills or night sweats. Patient denies any loss of vision, blurred vision. Patient denies any ringing  of the ears or hearing loss. No irregular heartbeat. Patient denies heart murmur or history of fainting. Patient denies any chest pain or pain radiating to her upper extremities. Patient denies any shortness of breath, difficulty breathing at night, cough or hemoptysis. Patient denies any swelling in the lower legs. Patient denies any nausea vomiting, vomiting of blood, or coffee ground material in the vomitus. Patient denies any stomach pain. Patient states has had normal bowel movements no significant constipation or diarrhea. Patient denies any dysuria, hematuria or significant nocturia. Patient denies any problems walking, swelling in the joints or loss of balance. Patient denies any skin changes, loss of hair or loss of weight. Patient denies any excessive worrying or anxiety or significant depression. Patient denies any problems with insomnia. Patient denies excessive thirst, polyuria, polydipsia. Patient denies any swollen glands, patient denies easy bruising or easy bleeding. Patient denies any recent infections, allergies or URI. Patient "s visual fields have not changed significantly in recent time.   PHYSICAL EXAM: BP 125/76   Pulse (!) 128   Temp (!) 97.1 F (36.2 C) (Tympanic)   Wt 146 lb 9.6 oz (66.5 kg)   BMI 21.65 kg/m  Patient does have some prominence of his left supraclavicular region recent biopsy of that area.  Well-developed well-nourished patient in NAD. HEENT reveals PERLA, EOMI, discs not visualized.  Oral cavity is clear. No oral mucosal lesions are identified. Neck is clear without evidence of  cervical or supraclavicular adenopathy. Lungs are clear to A&P. Cardiac examination is essentially unremarkable with regular rate and rhythm without murmur rub or thrill. Abdomen is benign with no organomegaly or masses noted. Motor sensory and DTR levels are equal and symmetric in the upper and lower extremities. Cranial nerves II through XII are grossly intact. Proprioception is intact. No peripheral adenopathy or edema is identified. No motor or sensory levels are noted. Crude visual fields are within normal range.  LABORATORY DATA: Pathology report reviewed left supraclavicular nodal biopsy pending    RADIOLOGY RESULTS: CT scan PET CT scans and MRI of brain all reviewed compatible with above-stated findings   IMPRESSION: Probable stage IIIc non-small cell lung cancer of the left lung in 50 year old male  PLAN: At this time would recommend concurrent chemoradiation we will certainly hold any treatment planning until we have tissue confirmation of his malignancy.  I would plan on delivering 6600 centigrade to his areas of hypermetabolic activity on PET CT scan including his left lung as well as mediastinal hilar and supraclavicular nodal regions.  Risks and benefits of treatment including skin reaction fatigue alteration blood counts possible radiation esophagitis all were discussed in detail with the patient.  I would  use 4-dimensional treatment planning as well as motion restriction during treatment planning.  I would choose IMRT to be able to treat all levels of hypermetabolic activity and spare critical structures such as esophagus spinal cord heart and normal lung volume.  There will be extra effort by both professional staff as well as technical staff to coordinate and manage concurrent chemoradiation and ensuing side effects during his treatments. Patient seems to comprehend my recommendations well.  Tentative simulation appointment for later this week was provided.  I would like to take this  opportunity to thank you for allowing me to participate in the care of your patient.Noreene Filbert, MD

## 2020-12-19 NOTE — Telephone Encounter (Signed)
-----   Message from Manus Rudd, RN sent at 12/19/2020  9:47 AM EDT ----- Dr. Baruch Gouty saw this patient in clinic today. He has developed and diffuse hive like rash on his arms and neck, it does not itch. He reports that he has had no new food but has started taking his pain medication. Advised patient to start benadryl and hold pain medication for today.

## 2020-12-20 ENCOUNTER — Telehealth: Payer: Self-pay | Admitting: Internal Medicine

## 2020-12-20 ENCOUNTER — Encounter: Payer: Self-pay | Admitting: *Deleted

## 2020-12-20 MED ORDER — PREDNISONE 50 MG PO TABS: ORAL_TABLET | ORAL | 0 refills | Status: DC

## 2020-12-20 NOTE — Telephone Encounter (Signed)
At home for -spoke to patient/mother regarding diagnosis of lymphoma.  Follow-up in the clinic on 6/23 at 2 PM.C-please schedule; and confirm with the patient.  Patient having hoarseness of voice/rash-slight improvement on Benadryl.  Recommend start prednisone stat.  Prescription sent to pharmacy.  Instructions given to the patient-that if hoarseness of voice worse/difficulty breathing-recommend calling 911.  Suspect a drug reaction.  Patient agreement.

## 2020-12-20 NOTE — Telephone Encounter (Signed)
C-please schedule on 2/23 at 2:15; MD; labs after the visit. To go over the treatment plan.  Please let him know that I will call him later today.  [place]

## 2020-12-21 LAB — FUNGUS CULTURE WITH STAIN

## 2020-12-21 LAB — FUNGAL ORGANISM REFLEX

## 2020-12-21 LAB — FUNGUS CULTURE RESULT

## 2020-12-22 ENCOUNTER — Inpatient Hospital Stay (HOSPITAL_BASED_OUTPATIENT_CLINIC_OR_DEPARTMENT_OTHER): Payer: Medicaid Other | Admitting: Internal Medicine

## 2020-12-22 ENCOUNTER — Encounter: Payer: Self-pay | Admitting: Internal Medicine

## 2020-12-22 ENCOUNTER — Other Ambulatory Visit: Payer: Self-pay

## 2020-12-22 ENCOUNTER — Inpatient Hospital Stay: Payer: Medicaid Other

## 2020-12-22 ENCOUNTER — Ambulatory Visit: Payer: MEDICAID

## 2020-12-22 DIAGNOSIS — Z5112 Encounter for antineoplastic immunotherapy: Secondary | ICD-10-CM | POA: Diagnosis not present

## 2020-12-22 DIAGNOSIS — C8589 Other specified types of non-Hodgkin lymphoma, extranodal and solid organ sites: Secondary | ICD-10-CM

## 2020-12-22 LAB — COMPREHENSIVE METABOLIC PANEL
ALT: 24 U/L (ref 0–44)
AST: 24 U/L (ref 15–41)
Albumin: 3.4 g/dL — ABNORMAL LOW (ref 3.5–5.0)
Alkaline Phosphatase: 70 U/L (ref 38–126)
Anion gap: 6 (ref 5–15)
BUN: 14 mg/dL (ref 6–20)
CO2: 29 mmol/L (ref 22–32)
Calcium: 9.3 mg/dL (ref 8.9–10.3)
Chloride: 104 mmol/L (ref 98–111)
Creatinine, Ser: 0.59 mg/dL — ABNORMAL LOW (ref 0.61–1.24)
GFR, Estimated: >60 mL/min (ref >60)
Glucose, Bld: 109 mg/dL — ABNORMAL HIGH (ref 70–99)
Potassium: 3.5 mmol/L (ref 3.5–5.1)
Sodium: 139 mmol/L (ref 135–145)
Total Bilirubin: 0.5 mg/dL (ref 0.3–1.2)
Total Protein: 7.1 g/dL (ref 6.5–8.1)

## 2020-12-22 LAB — CBC WITH DIFFERENTIAL/PLATELET
Abs Immature Granulocytes: 0.07 10*3/uL (ref 0.00–0.07)
Basophils Absolute: 0 10*3/uL (ref 0.0–0.1)
Basophils Relative: 0 %
Eosinophils Absolute: 0 10*3/uL (ref 0.0–0.5)
Eosinophils Relative: 0 %
HCT: 36.4 % — ABNORMAL LOW (ref 39.0–52.0)
Hemoglobin: 12.2 g/dL — ABNORMAL LOW (ref 13.0–17.0)
Immature Granulocytes: 1 %
Lymphocytes Relative: 15 %
Lymphs Abs: 1.7 10*3/uL (ref 0.7–4.0)
MCH: 30.6 pg (ref 26.0–34.0)
MCHC: 33.5 g/dL (ref 30.0–36.0)
MCV: 91.2 fL (ref 80.0–100.0)
Monocytes Absolute: 1.2 10*3/uL — ABNORMAL HIGH (ref 0.1–1.0)
Monocytes Relative: 11 %
Neutro Abs: 8.1 10*3/uL — ABNORMAL HIGH (ref 1.7–7.7)
Neutrophils Relative %: 73 %
Platelets: 240 10*3/uL (ref 150–400)
RBC: 3.99 MIL/uL — ABNORMAL LOW (ref 4.22–5.81)
RDW: 14.7 % (ref 11.5–15.5)
WBC: 11.1 10*3/uL — ABNORMAL HIGH (ref 4.0–10.5)
nRBC: 0 % (ref 0.0–0.2)

## 2020-12-22 LAB — LACTATE DEHYDROGENASE: LDH: 311 U/L — ABNORMAL HIGH (ref 98–192)

## 2020-12-22 LAB — URIC ACID: Uric Acid, Serum: 4.7 mg/dL (ref 3.7–8.6)

## 2020-12-22 LAB — HEPATITIS B SURFACE ANTIGEN: Hepatitis B Surface Ag: NONREACTIVE

## 2020-12-22 MED ORDER — ALLOPURINOL 300 MG PO TABS
300.0000 mg | ORAL_TABLET | Freq: Two times a day (BID) | ORAL | 0 refills | Status: DC
  Filled 2020-12-22: qty 60, 30d supply, fill #0
  Filled 2021-01-17 – 2021-01-24 (×2): qty 60, 30d supply, fill #1

## 2020-12-22 MED ORDER — MONTELUKAST SODIUM 10 MG PO TABS
ORAL_TABLET | ORAL | 0 refills | Status: DC
  Filled 2020-12-22: qty 30, 30d supply, fill #0

## 2020-12-22 MED ORDER — ACYCLOVIR 400 MG PO TABS
400.0000 mg | ORAL_TABLET | Freq: Two times a day (BID) | ORAL | 3 refills | Status: DC
  Filled 2020-12-22: qty 60, 30d supply, fill #0
  Filled 2021-01-17 – 2021-01-24 (×2): qty 60, 30d supply, fill #1
  Filled 2021-02-22: qty 60, 30d supply, fill #2

## 2020-12-22 MED ORDER — LIDOCAINE-PRILOCAINE 2.5-2.5 % EX CREA
TOPICAL_CREAM | CUTANEOUS | 0 refills | Status: DC
  Filled 2020-12-22: qty 15, 1d supply, fill #0
  Filled 2021-01-24: qty 15, 1d supply, fill #1

## 2020-12-22 MED ORDER — PROCHLORPERAZINE MALEATE 10 MG PO TABS
10.0000 mg | ORAL_TABLET | Freq: Four times a day (QID) | ORAL | 1 refills | Status: DC | PRN
  Filled 2020-12-22: qty 40, 10d supply, fill #0
  Filled 2021-01-24: qty 40, 10d supply, fill #1

## 2020-12-22 MED ORDER — ONDANSETRON HCL 4 MG PO TABS
ORAL_TABLET | ORAL | 1 refills | Status: DC
  Filled 2020-12-22: qty 80, 13d supply, fill #0
  Filled 2021-01-24: qty 80, 13d supply, fill #1

## 2020-12-22 MED ORDER — PREDNISONE 20 MG PO TABS
ORAL_TABLET | ORAL | 5 refills | Status: DC
  Filled 2020-12-22: qty 25, 5d supply, fill #0
  Filled 2021-01-24: qty 25, 5d supply, fill #1
  Filled 2021-02-22: qty 25, 5d supply, fill #2

## 2020-12-22 NOTE — Patient Instructions (Signed)
#  Start taking Tylenol twice a day-starting 2 days before infusion #Start taking Singulair once a day-starting 2 days before infusion #Take Benadryl 50 mg the night before infusion.

## 2020-12-22 NOTE — Assessment & Plan Note (Addendum)
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  FISH studies pending. HOLD off bone marrow biopsy.   # Revised IPI-good risk translates to approximately 80% chance of cure. Discussed that goal of lymphoma treatment is cure.  Plus multiple novel therapies available for patient if patient has progress/recurrence post first-line chemotherapy.  #Discussed R-CHOP chemotherapy every 3 weeks x 6 cycles.   would do interim scan after 2-3 cycles of chemo.  I discussed at length regarding the individual drugs in R-CHOP.  # Discussed the potential side effects including but not limited to-increasing fatigue, nausea vomiting, diarrhea, hair loss, sores in the mouth, increase risk of infection and also neuropathy. Also discussed regarding potential side effects of heart failure/leukemia with Adriamycin.  May 2022- 2D echo-55% ejection fraction discussed that benefits outweigh the risk.  Hepatitis work-up pending.  #Skin rash/hoarseness of voice-likely allergic reaction to unknown medication.  The patient denies hydrocodone as a culprit as he had taken hydrocodone prior to any problems.  Continue prednisone as ordered.   #Start chemotherapy next week. Antiemetics-Zofran and Compazine; EMLA cream sent to pharmacy.  # COVID EVUSHELD PROPHYLAXIS: Given the immunosuppressive effects of treatments I would recommend EVUSHELD prophylaxis.  IM injection x2 [same time]-cutdown risk of Covid infection/next 6 months. Patient is vaccinated to [place].  Referral to Ms.Causey, [place]-who will speak to the patient.  #Gout/shingles prophylaxis- allopurinol/acyclovir ordered.  # ordered premeds [singulair/ prednisone/Tylenol/Benadryl prior] for rituxan-written instructions given.  # DISPOSITION: # labs today-  # R-CHOP chemo ASAP; D-3 fulphila- # follow up TBD-Dr.B-dr.B  # 40 minutes face-to-face with the patient discussing the above plan of care;  more than 50% of time spent on prognosis/ natural history; counseling and coordination.

## 2020-12-22 NOTE — Progress Notes (Signed)
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  FISH studies pending. Recommend hold off bone marrow biopsy. PET June 9th 2022-Central left hilar and subcarinal mass with metastatic disease to the left lower lobe and substantial mediastinal and bilateral hilar adenopathy as well as mild lower neck level V adenopathy on the left. Revised IPI-good risk .  # June 27th, 2022- R-CHOP cycle #1.  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 50 y.o.  male patient with newly diagnosed lung mass/mediastinal mass/hilar masses-is here to review the results of his right supraclavicular lymph node biopsy.  Patient was evaluated by radiation oncology for presumed stage III lung cancer.  On the day patient noted to have rash all over the body; also noted to have hoarseness of voice.  Question related to hydrocodone/however patient denies.  Patient started on prednisone 50 mg twice a day for 5 days.  Notes a significant improvement of the rash.  Improvement of his hoarseness of voice but not completely back to baseline yet.   Review of Systems  Constitutional:  Positive for malaise/fatigue and weight loss. Negative for chills, diaphoresis and fever.  HENT:  Positive for sore throat. Negative for  nosebleeds.   Eyes:  Negative for double vision.  Respiratory:  Positive for shortness of breath. Negative for cough, hemoptysis, sputum production and wheezing.   Cardiovascular:  Negative for chest pain, palpitations, orthopnea and leg swelling.  Gastrointestinal:  Negative for abdominal pain, blood in stool, constipation, diarrhea, heartburn, melena, nausea and vomiting.  Genitourinary:  Negative for dysuria, frequency and urgency.  Musculoskeletal:  Negative for back pain and joint pain.  Skin: Negative.  Negative for itching and rash.  Neurological:  Negative for dizziness, tingling, focal weakness, weakness and headaches.  Endo/Heme/Allergies:  Does not bruise/bleed easily.  Psychiatric/Behavioral:  Negative for depression. The patient is not nervous/anxious and does not have insomnia.     MEDICAL HISTORY:  Past Medical History:  Diagnosis Date  . A-fib ([place])   . Asthma   . GERD (gastroesophageal reflux disease)   . Lung mass   . Pleural effusion     SURGICAL HISTORY: Past Surgical History:  Procedure Laterality Date  . IR IMAGING GUIDED PORT INSERTION  12/16/2020    SOCIAL HISTORY: Social History   Socioeconomic History  . Marital status: Married    Spouse name: Not on file  . Number of children: Not on file  . Years of education: Not on file  . Highest education level: Not on file  Occupational History  . Not on file  Tobacco Use  . Smoking status: Former    Packs/day: 0.50    Years: 20.00    Pack years: 10.00  Types: Cigarettes    Quit date: 11/25/2020    Years since quitting: 0.0  . Smokeless tobacco: Never  Vaping Use  . Vaping Use: Never used  Substance and Sexual Activity  . Alcohol use: Yes    Comment: "5 to 6 shots on weekends"  . Drug use: Never  . Sexual activity: Not on file  Other Topics Concern  . Not on file  Social History Narrative   Patient is a active smoker.;  Also liquor.  Lives with his wife in [place].  No children.  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 High blood pressure Mother   . Heart Problems Father   . Stomach cancer Paternal Grandmother     ALLERGIES:  has No Known Allergies.  MEDICATIONS:  Current Outpatient Medications  Medication Sig Dispense Refill  . acyclovir (ZOVIRAX) 400 MG tablet Take 1 tablet (400 mg total) by mouth 2 (two) times daily. One pill a day [to prevent shingles] 60 tablet 3  . allopurinol (ZYLOPRIM) 300 MG tablet Take 1 tablet (300 mg total) by mouth 2 (two) times daily. 120 tablet 0  . diphenhydrAMINE (BENADRYL) 25 MG tablet Take 25 mg by mouth every 6 (six) hours as needed.    . lidocaine-prilocaine (EMLA) cream Apply 30 -45 mins prior to port access. 30 g 0  . montelukast (SINGULAIR) 10 MG tablet Start 2 days prior to infusion. 30 tablet 0  . ondansetron (ZOFRAN) 8 MG tablet One pill every 8 hours as needed for nausea/vomitting. 40 tablet 1  . prochlorperazine (COMPAZINE) 10 MG tablet Take 1 tablet (10 mg total) by mouth every 6 (six) hours as needed for nausea or vomiting. 40 tablet 1  . albuterol (PROVENTIL) (2.5 MG/3ML) 0.083% nebulizer solution Inhale 3 mLs into the lungs every 6 (six) hours as needed.    Marland Kitchen albuterol (VENTOLIN HFA) 108 (90 Base) MCG/ACT inhaler Inhale 1-2 puffs into the lungs every 4 (four) hours as needed for wheezing or shortness of breath. 1 each 2  . chlorpheniramine-HYDROcodone (TUSSIONEX) 10-8 MG/5ML SUER Take 5 mLs by mouth every 12 (twelve) hours as needed for cough. (Patient not taking: No sig reported)    . cholecalciferol (VITAMIN D) 25 MCG tablet Take 1 tablet (1,000 Units total) by mouth daily. Can take any over-the-counter.     . diltiazem (CARDIZEM CD) 240 MG 24 hr capsule Take 1 capsule (240 mg total) by mouth daily. 30 capsule 0  . feeding supplement (ENSURE ENLIVE / ENSURE PLUS) LIQD Take 237 mLs by mouth 3 (three) times daily between meals. 237 mL 12  . HYDROcodone-acetaminophen (NORCO/VICODIN) 5-325 MG tablet Take 1 tablet by mouth every 6 (six) hours as needed.    Marland Kitchen ipratropium-albuterol (DUONEB) 0.5-2.5 (3) MG/3ML SOLN Take 3 mLs by nebulization every 6 (six) hours as needed. 75 mL 2  . metoprolol tartrate (LOPRESSOR) 50 MG tablet Take 1 tablet (50 mg total) by mouth every 6 (six) hours. 120 tablet 0  . Multiple Vitamin (MULTIVITAMIN WITH MINERALS) TABS tablet Take 1 tablet by mouth daily.    Marland Kitchen omeprazole (PRILOSEC) 10 MG capsule Take 20 mg by mouth  daily.    . predniSONE (DELTASONE) 50 MG tablet Take 100 mg once day x 5 days; START on the day of chemo 10 tablet 5   No current facility-administered medications for this visit.      Marland Kitchen  PHYSICAL EXAMINATION: ECOG PERFORMANCE STATUS: 1 - Symptomatic but completely ambulatory  Vitals:   12/22/20 1436  BP: 128/72  Pulse: (!) 112  Resp: 20  Temp: 97.7 F (36.5 C)  SpO2: 100%   Filed Weights   12/22/20 1436  Weight: 148 lb 3.2 oz (67.2 kg)    Physical Exam Vitals and nursing note reviewed.  Constitutional:      Comments: Ambulating: Independently  Accompanied: Mother  HENT:     Head: Normocephalic and atraumatic.     Mouth/Throat:     Pharynx: Oropharynx is clear.  Eyes:     Extraocular Movements: Extraocular movements intact.     Pupils: Pupils are equal, round, and reactive to light.  Cardiovascular:     Rate and Rhythm: Normal rate and regular rhythm.  Pulmonary:     Comments: Decreased breath sounds bilaterally left more than right.  Abdominal:     Palpations: Abdomen is soft.  Musculoskeletal:        General: Normal range of motion.     Cervical back: Normal range of motion.  Skin:    General: Skin is warm.  Neurological:      General: No focal deficit present.     Mental Status: He is alert and oriented to person, place, and time.  Psychiatric:        Behavior: Behavior normal.        Judgment: Judgment normal.     LABORATORY DATA:  I have reviewed the data as listed Lab Results  Component Value Date   WBC 11.1 (H) 12/22/2020   HGB 12.2 (L) 12/22/2020   HCT 36.4 (L) 12/22/2020   MCV 91.2 12/22/2020   PLT 240 12/22/2020   Recent Labs    11/11/20 1702 11/12/20 0439 11/30/20 0601 12/13/20 1216 12/22/20 1549  NA 133*   < > 138 134* 139  K 4.3   < > 4.0 4.3 3.5  CL 99   < > 99 100 104  CO2 23   < > 29 26 29   GLUCOSE 124*   < > 118* 97 109*  BUN 9   < > 13 22* 14  CREATININE 0.44*   < > 0.60* 0.63 0.59*  CALCIUM 9.0   < > 8.8* 8.5* 9.3  GFRNONAA >60   < > >60 >60 >60  PROT 6.9  --   --  6.1* 7.1  ALBUMIN 2.9*  --   --  3.3* 3.4*  AST 25  --   --  22 24  ALT 13  --   --  22 24  ALKPHOS 76  --   --  70 70  BILITOT 0.5  --   --  0.8 0.5   < > = values in this interval not displayed.    RADIOGRAPHIC STUDIES: I have personally reviewed the radiological images as listed and agreed with the findings in the report. DG Chest 2 View  Result Date: 12/01/2020 CLINICAL DATA:  Status post biopsy EXAM: CHEST - 2 VIEW COMPARISON:  November 30, 2020 FINDINGS: No pneumothorax is appreciable by radiography. There is a persistent residual pleural effusion on the left. Apparent masses in the left upper and left lower lobe regions with surrounding pneumonitis remains stable. Right lung  is clear. Heart size and pulmonary vascularity normal. No adenopathy. No bone lesions. IMPRESSION: No pneumothorax evident by radiography. Mass lesions with surrounding pneumonitis on the left remain essentially stable with small left pleural effusion also noted. No new opacity evident. Right lung clear. Heart size normal. Electronically Signed   By: Lowella Grip III M.D.   On: 12/01/2020 09:37   DG Chest 2 View  Result Date:  11/25/2020 CLINICAL DATA:  Chest pain and shortness of breath EXAM: CHEST - 2 VIEW COMPARISON:  Chest radiograph 11/18/2020, chest CT 11/11/2020 FINDINGS: Unchanged cardiomediastinal silhouette. There is increased opacification of the left upper lung, with unchanged appearance of the left perihilar and left lower lobe mass. Small left pleural effusion. No visible pneumothorax. IMPRESSION: Increased opacification of the left upper lung with known left perihilar and left lower lobe masses, consistent with worsening postobstructive pneumonia. Small left pleural effusion. Electronically Signed   By: Maurine Simmering   On: 11/25/2020 19:04   CT Angio Chest PE W and/or Wo Contrast  Result Date: 11/25/2020 CLINICAL DATA:  History of pneumonia left-sided chest pain EXAM: CT ANGIOGRAPHY CHEST WITH CONTRAST TECHNIQUE: Multidetector CT imaging of the chest was performed using the standard protocol during bolus administration of intravenous contrast. Multiplanar CT image reconstructions and MIPs were obtained to evaluate the vascular anatomy. CONTRAST:  66m OMNIPAQUE IOHEXOL 350 MG/ML SOLN COMPARISON:  Chest x-ray 11/25/2020, CT chest 11/11/2020 FINDINGS: Cardiovascular: Satisfactory opacification of the pulmonary arteries to the segmental level. No evidence of pulmonary embolism. Encasement and diffuse narrowing of left-sided pulmonary vasculature secondary to lung mass. Normal cardiac size. Trace pericardial effusion. Mediastinum/Nodes: No thyroid mass. Mild narrowing of left mainstem bronchus with occlusion of left upper and lower lobe bronchi by tumor. Redemonstrated bulky mediastinal and left hilar adenopathy. Index prevascular node measuring 2.1 cm, 2.2 cm previously. Conglomerate subcarinal and left hilar adenopathy versus tumor infiltration. Enlarged left cardio phrenic node up to 8 mm, previously 7 mm. Esophageal displacement to the right by mediastinal adenopathy/mass, esophagus cannot be clearly separated from bulky  mediastinal mass. Distal esophageal nodes measuring up to 12 mm. Lungs/Pleura: Moderate partially loculated left pleural effusion as before. Large poorly defined medial left upper lobe and left hilar lung mass with mediastinal invasion and or poorly defined adenopathy. Large presumed metastatic mass mostly within the left lower lobe, measuring 8.7 by 5.8 cm, previously 7.5 x 4.1 cm. Subpleural anterior left lower lobe lung mass measures 2.9 x 1.8 cm, previously 2.6 x 1.2 cm, series 6, image 78. Extensive ground-glass density and patchy consolidation in the left upper lobe consistent with postobstructive pneumonia. Mild nodularity along the major fissure. Slightly worsened ground-glass density in the left lower lobe and surrounding the dominant left lower lobe lung mass. New or increased irregular nodularity in the left lateral lung base, series 6, image 77. Upper Abdomen: No acute abnormality. Subcentimeter low-density lesions in the liver too small to further characterize. Musculoskeletal: No acute osseous abnormality Review of the MIP images confirms the above findings. IMPRESSION: 1. Negative for acute pulmonary embolus. Diffuse narrowing and encasement of left-sided pulmonary vasculature by large lung mass. 2. Large poorly defined left upper lobe and hilar lung mass with bulky mediastinal and left hilar metastatic disease. Possible mediastinal invasion by large left upper lobe lung mass. Occlusion of left upper and lower lobe bronchi by tumor. Metastatic lesions in the left lower lobe measuring up to 8.7 cm, increased compared to prior CT. Slight worsening of diffuse ground-glass density in the left  lower lobe, likely worsening postobstructive pneumonia. Continued extensive consolidation and ground-glass densities in the left upper lobe, also consistent with postobstructive pneumonia. Redemonstrated fissural nodularity in the left upper lobe raising concern for metastatic disease. Moderate partially loculated  left-sided pleural effusion. Electronically Signed   By: Donavan Foil M.D.   On: 11/25/2020 21:04   MR Brain W Wo Contrast  Result Date: 12/16/2020 CLINICAL DATA:  Lung mass.  Evaluation for metastatic disease. EXAM: MRI HEAD WITHOUT AND WITH CONTRAST TECHNIQUE: Multiplanar, multiecho pulse sequences of the brain and surrounding structures were obtained without and with intravenous contrast. CONTRAST:  42m GADAVIST GADOBUTROL 1 MMOL/ML IV SOLN COMPARISON:  None. FINDINGS: Brain: There is no evidence of an acute infarct, intracranial hemorrhage, mass, midline shift, or extra-axial fluid collection. The ventricles and sulci are normal. The brain is normal in signal. No abnormal brain parenchymal or meningeal enhancement is identified. A small developmental venous anomaly is incidentally noted laterally in the right cerebellar hemisphere. Vascular: Major intracranial vascular flow voids are preserved. Skull and upper cervical spine: Unremarkable bone marrow signal. Sinuses/Orbits: Unremarkable orbits. Minimal mucosal thickening in the left maxillary sinus. Trace bilateral mastoid fluid. Other: Small right frontal scalp lipoma. IMPRESSION: Unremarkable appearance of the brain. No evidence of intracranial metastases. Electronically Signed   By: ALogan BoresM.D.   On: 12/16/2020 12:06   NM PET Image Initial (PI) Skull Base To Thigh  Result Date: 12/09/2020 CLINICAL DATA:  Initial treatment strategy for non-small cell lung cancer. EXAM: NUCLEAR MEDICINE PET SKULL BASE TO THIGH TECHNIQUE: 7.7 mCi F-18 FDG was injected intravenously. Full-ring PET imaging was performed from the skull base to thigh after the radiotracer. CT data was obtained and used for attenuation correction and anatomic localization. Fasting blood glucose: 121 mg/dl COMPARISON:  CT chest 11/25/2020 FINDINGS: Mediastinal blood pool activity: SUV max 1.4 Liver activity: SUV max NA NECK: A 0.7 cm left level V lymph node on image 60 of series 3 has a  maximum SUV of 6.0. Additional lymph nodes straddle between level IV and the supraclavicular region, and will be counted as supraclavicular nodes below. Incidental CT findings: none CHEST: Hypermetabolic supraclavicular, paratracheal, prevascular, AP window, left paratracheal, left paraspinal, bilateral hilar, left infrahilar, lower paraesophageal, and subcarinal adenopathy noted. Right supraclavicular node 1.4 cm in short axis on image 66 series 3 with maximum SUV 6.7. Anterior AP window lymph node 2.8 cm in short axis on image 100 series 3, maximum SUV 9.0. Index right lower paratracheal lymph node 2.0 cm in short axis on image 99 series 3, maximum SUV 8.9. Subcarinal mass noted involving the left hilum/tracheobronchial tree and measuring about 10.1 by 6.0 cm with maximum SUV 8.4. Left lower lobe mass 8.8 by 4.6 cm on image 113 series 3, centrally photopenic but high activity along the margins with maximum SUV up to 4.4. Consolidation/drowned lung appearance in a substantial portion of the left upper lobe posteriorly with only very low-grade metabolic activity, maximum SUV 1.4. Anteriorly in the left lower lobe adjacent to the major fissure, a 2.9 by 1.9 cm nodule on image 131 series 3 is observed with maximum SUV 2.3. There has been some interval clearing of some of the consolidation anteriorly in the left upper lobe, with some mild residual nodularity in the left upper lobe which for the most part is without significant hypermetabolic activity. A bandlike region of volume loss in the lingula is present with maximum SUV of about 1.6. Some of the ground-glass density in the left  lower lobe has intervally cleared. Small left pleural effusion without specific indicators of malignant pleural effusion. Incidental CT findings: none ABDOMEN/PELVIS: No significant abnormal hypermetabolic activity in this region. Incidental CT findings: Contracted gallbladder with questionable small gallstones. Aortoiliac atherosclerotic  vascular disease. SKELETON: No significant abnormal hypermetabolic activity in this region. Incidental CT findings: Degenerative disc disease at L5-S1. IMPRESSION: 1. Central left hilar and subcarinal mass with metastatic disease to the left lower lobe and substantial mediastinal and bilateral hilar adenopathy as well as mild lower neck level V adenopathy on the left. Appearance favors T4 N3 M0 disease (assuming the left pleural effusion is not malignant) compatible with stage III C non-small cell lung cancer. 2. Improved appearance of consolidation in the left upper lobe. 3. Small left pleural effusion. 4. Suspected cholelithiasis. 5.  Aortic Atherosclerosis (ICD10-I70.0). Electronically Signed   By: Van Clines M.D.   On: 12/09/2020 09:10   DG Chest Port 1 View  Result Date: 11/30/2020 CLINICAL DATA:  Follow-up left pneumothorax after lung biopsy. EXAM: PORTABLE CHEST 1 VIEW COMPARISON:  CTA chest and chest x-ray dated Nov 25, 2020. FINDINGS: Normal heart size. Unchanged large left lung mass and postobstructive pneumonia. The right lung remains clear. Unchanged small left pleural effusion. Trace left pneumothorax seen on post biopsy CT is not readily identified by x-ray. No acute osseous abnormality. IMPRESSION: 1. Trace left pneumothorax seen on post biopsy CT is not readily identified by x-ray. No enlarging pneumothorax. Electronically Signed   By: Titus Dubin M.D.   On: 11/30/2020 12:11   ECHOCARDIOGRAM COMPLETE  Result Date: 11/29/2020    ECHOCARDIOGRAM REPORT   Patient Name:   Curtis Manning. Date of Exam: 11/28/2020 Medical Rec #:  [id number]          Height:       68.0 in Accession #:    [id number]         Weight:       149.6 lb Date of Birth:  03-Sep-1970           BSA:          1.806 m Patient Age:    50 years           BP:           115/80 mmHg Patient Gender: M                  HR:           106 bpm. Exam Location:  [facility] Procedure: 2D Echo, Cardiac Doppler and Color Doppler Indications:      Atrial Fibrillation I48.91  History:         Patient has no prior history of Echocardiogram examinations.  Sonographer:     Alyse Low Roar Referring Phys:  [place] Diagnosing Phys: Neoma Laming MD IMPRESSIONS  1. Left ventricular ejection fraction, by estimation, is 50 to 55%. The left ventricle has low normal function. The left ventricle demonstrates global hypokinesis. The left ventricular internal cavity size was mildly dilated. Left ventricular diastolic parameters are consistent with Grade I diastolic dysfunction (impaired relaxation).  2. Right ventricular systolic function is normal. The right ventricular size is moderately enlarged.  3. Left atrial size was moderately dilated.  4. Right atrial size was moderately dilated.  5. The mitral valve is normal in structure. Mild mitral valve regurgitation. No evidence of mitral stenosis.  6. The aortic valve is normal in structure. Aortic valve regurgitation is not visualized. No aortic stenosis is  present.  7. The inferior vena cava is normal in size with greater than 50% respiratory variability, suggesting right atrial pressure of 3 mmHg. FINDINGS  Left Ventricle: Left ventricular ejection fraction, by estimation, is 50 to 55%. The left ventricle has low normal function. The left ventricle demonstrates global hypokinesis. The left ventricular internal cavity size was mildly dilated. There is no left ventricular hypertrophy. Left ventricular diastolic parameters are consistent with Grade I diastolic dysfunction (impaired relaxation). Right Ventricle: The right ventricular size is moderately enlarged. No increase in right ventricular wall thickness. Right ventricular systolic function is normal. Left Atrium: Left atrial size was moderately dilated. Right Atrium: Right atrial size was moderately dilated. Pericardium: There is no evidence of pericardial effusion. Mitral Valve: The mitral valve is normal in structure. Mild mitral valve regurgitation. No  evidence of mitral valve stenosis. Tricuspid Valve: The tricuspid valve is normal in structure. Tricuspid valve regurgitation is mild . No evidence of tricuspid stenosis. Aortic Valve: The aortic valve is normal in structure. Aortic valve regurgitation is not visualized. No aortic stenosis is present. Aortic valve peak gradient measures 8.2 mmHg. Pulmonic Valve: The pulmonic valve was normal in structure. Pulmonic valve regurgitation is not visualized. No evidence of pulmonic stenosis. Aorta: The aortic root is normal in size and structure. Venous: The inferior vena cava is normal in size with greater than 50% respiratory variability, suggesting right atrial pressure of 3 mmHg. IAS/Shunts: No atrial level shunt detected by color flow Doppler.  LEFT VENTRICLE PLAX 2D LVIDd:         3.55 cm  Diastology LVIDs:         2.58 cm  LV e' medial:    10.00 cm/s LV PW:         0.97 cm  LV E/e' medial:  11.6 LV IVS:        0.99 cm  LV e' lateral:   12.50 cm/s LVOT diam:     1.80 cm  LV E/e' lateral: 9.3 LVOT Area:     2.54 cm  RIGHT VENTRICLE RV Mid diam:    2.66 cm RV S prime:     16.30 cm/s TAPSE (M-mode): 2.0 cm LEFT ATRIUM             Index       RIGHT ATRIUM           Index LA diam:        3.30 cm 1.83 cm/m  RA Area:     12.00 cm LA Vol (A2C):   35.4 ml 19.60 ml/m RA Volume:   25.10 ml  13.89 ml/m LA Vol (A4C):   21.9 ml 12.12 ml/m LA Biplane Vol: 29.1 ml 16.11 ml/m  AORTIC VALVE                PULMONIC VALVE AV Area (Vmax): 1.92 cm    PV Vmax:        0.96 m/s AV Vmax:        143.00 cm/s PV Peak grad:   3.7 mmHg AV Peak Grad:   8.2 mmHg    RVOT Peak grad: 2 mmHg LVOT Vmax:      108.00 cm/s  AORTA Ao Root diam: 2.40 cm MITRAL VALVE MV Area (PHT): 5.97 cm     SHUNTS MV Decel Time: 127 msec     Systemic Diam: 1.80 cm MV E velocity: 116.00 cm/s MV A velocity: 70.80 cm/s MV E/A ratio:  1.64 MV A Prime:    6.8 cm/s  Neoma Laming MD Electronically signed by Neoma Laming MD Signature Date/Time: 11/29/2020/9:01:47 AM     Final    [place] CORE BIOPSY (LYMPH NODES)  Result Date: 12/16/2020 INDICATION: Non-small cell lung cancer.  Multifocal lymphadenopathy. EXAM: ULTRASOUND GUIDED CORE NEEDLE BIOPSY OF ENLARGED LEFT NECK LYMPH NODE MEDICATIONS: None. ANESTHESIA/SEDATION: None PROCEDURE: The procedure, risks, benefits, and alternatives were explained to the patient. Questions regarding the procedure were encouraged and answered. The patient understands and consents to the procedure. The left neck skin was prepped with chlorhexidine in a sterile fashion, and a sterile drape was applied covering the operative field. A sterile gown and sterile gloves were used for the procedure. Local anesthesia was provided with 1% Lidocaine. Following local lidocaine administration, four 18 gauge cores were obtained from the enlarged left supraclavicular lymph node utilizing continuous ultrasound guidance. Samples were sent to pathology in sterile saline. Needle removed and hemostasis achieved with 2 minutes of manual compression. Post procedure ultrasound images showed no evidence of significant hemorrhage. COMPLICATIONS: None immediate. IMPRESSION: Ultrasound-guided core needle biopsy of enlarged left neck lymph node. Electronically Signed   By: Miachel Roux M.D.   On: 12/16/2020 15:43   IR IMAGING GUIDED PORT INSERTION  Result Date: 12/16/2020 INDICATION: Non-small cell lung cancer EXAM: IMPLANTED PORT A CATH PLACEMENT WITH ULTRASOUND AND FLUOROSCOPIC GUIDANCE MEDICATIONS: None ANESTHESIA/SEDATION: Moderate (conscious) sedation was employed during this procedure. A total of Versed 1 mg and Fentanyl 50 mcg was administered intravenously. Moderate Sedation Time: 18 minutes. The patient's level of consciousness and vital signs were monitored continuously by radiology nursing throughout the procedure under my direct supervision. FLUOROSCOPY TIME:  0.6 minutes (9.45 mGy) COMPLICATIONS: None immediate. PROCEDURE: The procedure, risks, benefits, and  alternatives were explained to the patient. Questions regarding the procedure were encouraged and answered. The patient understands and consents to the procedure. A timeout was performed prior to the initiation of the procedure. Patient positioned supine on the angiography table. Right neck and anterior upper chest prepped and draped in the usual sterile fashion. All elements of maximal sterile barrier were utilized including, cap, mask, sterile gown, sterile gloves, large sterile drape, hand scrubbing and 2% Chlorhexidine for skin cleaning. The right internal jugular vein was evaluated with ultrasound and shown to be patent. A permanent ultrasound image was obtained and placed in the patient's medical record. Local anesthesia was provided with 1% lidocaine with epinephrine. Using sterile gel and a sterile probe cover, the right internal jugular vein was entered with a 21 ga needle during real time ultrasound guidance. 0.018 inch guidewire placed and 21 ga needle exchanged for transitional dilator set. Utilizing fluoroscopy, 0.035 inch guidewire advanced through the needle without difficulty. Attention then turned to the right anterior upper chest. Following local lidocaine administration, a port pocket was created. The catheter was connected to the port and brought from the pocket to the venotomy site through a subcutaneous tunnel. The catheter was cut to size and inserted through the peel-away sheath. The catheter tip was positioned at the cavoatrial junction using fluoroscopic guidance. The port aspirated and flushed well. The port pocket was closed with deep and superficial absorbable suture. The port pocket incision and venotomy sites were also sealed with Dermabond. IMPRESSION: Successful placement of a right internal jugular approach power injectable Port-A-Cath. The catheter is ready for immediate use. Electronically Signed   By: Miachel Roux M.D.   On: 12/16/2020 15:11   CT LUNG MASS BIOPSY  Result  Date: 11/30/2020 CLINICAL DATA:  Extensive tumor in  the left lung, mediastinum and left hilum including a nearly 9 cm left lower lobe lung mass. The patient is not a candidate for bronchoscopy currently and request has been made for percutaneous biopsy of tumor in order to establish a diagnosis. EXAM: CT GUIDED CORE BIOPSY OF LEFT LOWER LOBE LUNG MASS ANESTHESIA/SEDATION: 1.0 mg IV Versed; 100 mcg IV Fentanyl Total Moderate Sedation Time:  19 minutes. The patient's level of consciousness and physiologic status were continuously monitored during the procedure by Radiology nursing. PROCEDURE: The procedure risks, benefits, and alternatives were explained to the patient. Questions regarding the procedure were encouraged and answered. The patient understands and consents to the procedure. A time-out was performed prior to initiating the procedure. CT was performed through the chest with the left side rolled up in an oblique position. The left chest wall was prepped with chlorhexidine in a sterile fashion, and a sterile drape was applied covering the operative field. A sterile gown and sterile gloves were used for the procedure. Local anesthesia was provided with 1% Lidocaine. A 17 gauge trocar needle was advanced to the level of a left lower lobe lung mass. After confirming needle tip position, 3 separate coaxial 18 gauge core biopsy samples were obtained and submitted in formalin. The BioSentry device was utilized in depositing a plug at the pleural entry site. Additional CT imaging was performed. COMPLICATIONS: Tiny left lateral pneumothorax. FINDINGS: The large mass of the left lower lobe was targeted for sampling measuring approximately 8.9 cm in greatest diameter. Solid tissue was obtained with core biopsy. After the procedure CT demonstrates a tiny left lateral pneumothorax. IMPRESSION: CT-guided core biopsy of 9 cm left lower lobe lung mass. The procedure was complicated by a tiny left lateral pneumothorax which  will be followed during recovery by chest x-ray. Electronically Signed   By: Aletta Edouard M.D.   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  FISH studies pending. HOLD off bone marrow biopsy.   # Revised IPI-good risk translates to approximately 80% chance of cure. Discussed that goal of lymphoma treatment is cure.  Plus multiple novel therapies available for patient if patient has progress/recurrence post first-line chemotherapy.  #Discussed R-CHOP chemotherapy every 3 weeks x 6 cycles.   would do interim scan after 2-3 cycles of chemo.  I discussed at length regarding the individual drugs in R-CHOP.   # Discussed the potential side effects including but not limited to-increasing fatigue, nausea vomiting, diarrhea, hair loss, sores in the mouth, increase risk of infection and also neuropathy. Also discussed regarding potential side effects of heart failure/leukemia with Adriamycin.  May 2022- 2D echo-55% ejection fraction discussed that benefits outweigh the risk.  Hepatitis work-up pending.   #Skin rash/hoarseness of voice-likely allergic reaction to unknown medication.  The patient denies hydrocodone as a culprit as he had taken hydrocodone prior to any problems.  Continue prednisone as ordered.   #Start chemotherapy next week. Antiemetics-Zofran and Compazine; EMLA cream sent to pharmacy.   # COVID EVUSHELD PROPHYLAXIS: Given the immunosuppressive effects of treatments I would recommend EVUSHELD prophylaxis.  IM injection x2 [same time]-cutdown risk of Covid infection/next 6 months. Patient is vaccinated to [place].  Referral to Ms.Causey, [place]-who will speak to the patient.   #Gout/shingles prophylaxis- allopurinol/acyclovir ordered.   # ordered premeds [singulair/ prednisone/Tylenol/Benadryl prior] for  rituxan-written instructions given.  # DISPOSITION: # labs today-  # R-CHOP chemo ASAP; D-3 fulphila- # follow up TBD-Dr.B-dr.B  # 40 minutes face-to-face with the patient discussing the above plan of care; more than 50% of time spent on prognosis/ natural history; counseling and coordination.  All questions were answered. The patient knows to call the clinic with any problems, questions or concerns.    Cammie Sickle, MD 12/22/2020 8:46 PM

## 2020-12-22 NOTE — Progress Notes (Signed)

## 2020-12-23 ENCOUNTER — Encounter: Payer: Self-pay | Admitting: Internal Medicine

## 2020-12-23 ENCOUNTER — Other Ambulatory Visit: Payer: Self-pay

## 2020-12-23 ENCOUNTER — Other Ambulatory Visit: Payer: Self-pay | Admitting: Internal Medicine

## 2020-12-23 LAB — BETA 2 MICROGLOBULIN, SERUM: Beta-2 Microglobulin: 1.9 mg/L (ref 0.6–2.4)

## 2020-12-23 LAB — HEPATITIS B CORE ANTIBODY, IGM: Hep B C IgM: NONREACTIVE

## 2020-12-23 LAB — HEPATITIS C ANTIBODY: HCV Ab: NONREACTIVE

## 2020-12-23 NOTE — Progress Notes (Signed)
J-please schedule follow-up on 6/27 at 8:45; MD only; No labs-; chemo infusion as planned-Dr.B

## 2020-12-26 ENCOUNTER — Encounter: Payer: Self-pay | Admitting: *Deleted

## 2020-12-26 ENCOUNTER — Inpatient Hospital Stay (HOSPITAL_BASED_OUTPATIENT_CLINIC_OR_DEPARTMENT_OTHER): Payer: Medicaid Other | Admitting: Internal Medicine

## 2020-12-26 ENCOUNTER — Inpatient Hospital Stay: Payer: Medicaid Other

## 2020-12-26 ENCOUNTER — Other Ambulatory Visit: Payer: Self-pay | Admitting: *Deleted

## 2020-12-26 ENCOUNTER — Inpatient Hospital Stay: Payer: Medicaid Other | Admitting: Internal Medicine

## 2020-12-26 VITALS — BP 111/67 | HR 92 | Temp 97.0°F | Resp 20 | Wt 148.8 lb

## 2020-12-26 DIAGNOSIS — C8589 Other specified types of non-Hodgkin lymphoma, extranodal and solid organ sites: Secondary | ICD-10-CM

## 2020-12-26 DIAGNOSIS — Z5112 Encounter for antineoplastic immunotherapy: Secondary | ICD-10-CM | POA: Diagnosis not present

## 2020-12-26 MED ORDER — HEPARIN SOD (PORK) LOCK FLUSH 100 UNIT/ML IV SOLN
500.0000 [IU] | Freq: Once | INTRAVENOUS | Status: AC
  Administered 2020-12-26: 500 [IU] via INTRAVENOUS
  Filled 2020-12-26: qty 5

## 2020-12-26 MED ORDER — ACETAMINOPHEN 325 MG PO TABS
650.0000 mg | ORAL_TABLET | Freq: Once | ORAL | Status: AC
  Administered 2020-12-26: 650 mg via ORAL
  Filled 2020-12-26: qty 2

## 2020-12-26 MED ORDER — SODIUM CHLORIDE 0.9% FLUSH
10.0000 mL | INTRAVENOUS | Status: DC | PRN
  Administered 2020-12-26: 10 mL via INTRAVENOUS
  Filled 2020-12-26: qty 10

## 2020-12-26 MED ORDER — FOSAPREPITANT DIMEGLUMINE INJECTION 150 MG
150.0000 mg | Freq: Once | INTRAVENOUS | Status: AC
  Administered 2020-12-26: 150 mg via INTRAVENOUS
  Filled 2020-12-26: qty 150

## 2020-12-26 MED ORDER — DOXORUBICIN HCL CHEMO IV INJECTION 2 MG/ML
50.0000 mg/m2 | Freq: Once | INTRAVENOUS | Status: AC
  Administered 2020-12-26: 90 mg via INTRAVENOUS
  Filled 2020-12-26: qty 45

## 2020-12-26 MED ORDER — FAMOTIDINE 20 MG IN NS 100 ML IVPB
20.0000 mg | Freq: Once | INTRAVENOUS | Status: AC
  Administered 2020-12-26: 20 mg via INTRAVENOUS
  Filled 2020-12-26: qty 20

## 2020-12-26 MED ORDER — DIPHENHYDRAMINE HCL 25 MG PO CAPS
50.0000 mg | ORAL_CAPSULE | Freq: Once | ORAL | Status: AC
  Administered 2020-12-26: 50 mg via ORAL
  Filled 2020-12-26: qty 2

## 2020-12-26 MED ORDER — MONTELUKAST SODIUM 10 MG PO TABS
10.0000 mg | ORAL_TABLET | Freq: Every day | ORAL | Status: DC
  Administered 2020-12-26: 10 mg via ORAL
  Filled 2020-12-26: qty 1

## 2020-12-26 MED ORDER — PALONOSETRON HCL INJECTION 0.25 MG/5ML
0.2500 mg | Freq: Once | INTRAVENOUS | Status: AC
  Administered 2020-12-26: 0.25 mg via INTRAVENOUS
  Filled 2020-12-26: qty 5

## 2020-12-26 MED ORDER — SODIUM CHLORIDE 0.9 % IV SOLN
10.0000 mg | Freq: Once | INTRAVENOUS | Status: AC
  Administered 2020-12-26: 10 mg via INTRAVENOUS
  Filled 2020-12-26: qty 10

## 2020-12-26 MED ORDER — SODIUM CHLORIDE 0.9 % IV SOLN
Freq: Once | INTRAVENOUS | Status: AC
  Filled 2020-12-26: qty 250

## 2020-12-26 MED ORDER — SODIUM CHLORIDE 0.9 % IV SOLN
750.0000 mg/m2 | Freq: Once | INTRAVENOUS | Status: AC
  Administered 2020-12-26: 1360 mg via INTRAVENOUS
  Filled 2020-12-26: qty 50

## 2020-12-26 MED ORDER — VINCRISTINE SULFATE CHEMO INJECTION 1 MG/ML
2.0000 mg | Freq: Once | INTRAVENOUS | Status: AC
  Administered 2020-12-26: 2 mg via INTRAVENOUS
  Filled 2020-12-26: qty 2

## 2020-12-26 MED ORDER — SODIUM CHLORIDE 0.9 % IV SOLN
375.0000 mg/m2 | Freq: Once | INTRAVENOUS | Status: AC
  Administered 2020-12-26: 700 mg via INTRAVENOUS
  Filled 2020-12-26: qty 20

## 2020-12-26 NOTE — Patient Instructions (Signed)
[facility]  Discharge Instructions: Thank you for choosing [facility] to provide your oncology and hematology care.  If you have a lab appointment with the [facility], please go directly to the [facility] and check in at the registration area.  Wear comfortable clothing and clothing appropriate for easy access to any Portacath or PICC line.   We strive to give you quality time with your provider. You may need to reschedule your appointment if you arrive late (15 or more minutes).  Arriving late affects you and other patients whose appointments are after yours.  Also, if you miss three or more appointments without notifying the office, you may be dismissed from the clinic at the provider's discretion.      For prescription refill requests, have your pharmacy contact our office and allow 72 hours for refills to be completed.    Today you received the following chemotherapy and/or immunotherapy agents R-CHOP      To help prevent nausea and vomiting after your treatment, we encourage you to take your nausea medication as directed.  BELOW ARE SYMPTOMS THAT SHOULD BE REPORTED IMMEDIATELY: *FEVER GREATER THAN 100.4 F (38 C) OR HIGHER *CHILLS OR SWEATING *NAUSEA AND VOMITING THAT IS NOT CONTROLLED WITH YOUR NAUSEA MEDICATION *UNUSUAL SHORTNESS OF BREATH *UNUSUAL BRUISING OR BLEEDING *URINARY PROBLEMS (pain or burning when urinating, or frequent urination) *BOWEL PROBLEMS (unusual diarrhea, constipation, pain near the anus) TENDERNESS IN MOUTH AND THROAT WITH OR WITHOUT PRESENCE OF ULCERS (sore throat, sores in mouth, or a toothache) UNUSUAL RASH, SWELLING OR PAIN  UNUSUAL VAGINAL DISCHARGE OR ITCHING   Items with * indicate a potential emergency and should be followed up as soon as possible or go to the Emergency Department if any problems should occur.  Please show the CHEMOTHERAPY ALERT CARD or IMMUNOTHERAPY ALERT CARD at check-in to  the Emergency Department and triage nurse.  Should you have questions after your visit or need to cancel or reschedule your appointment, please contact [facility]  [phone] and follow the prompts.  Office hours are 8:00 a.m. to 4:30 p.m. Monday - Friday. Please note that voicemails left after 4:00 p.m. may not be returned until the following business day.  We are closed weekends and major holidays. You have access to a nurse at all times for urgent questions. Please call the main number to the clinic [phone] and follow the prompts.  For any non-urgent questions, you may also contact your provider using MyChart. We now offer e-Visits for anyone 50 and older to request care online for non-urgent symptoms. For details visit [email or web address].   Also download the MyChart app! Go to the app store, search "MyChart", open the app, select [facility], and log in with your MyChart username and password.  Due to Covid, a mask is required upon entering the hospital/clinic. If you do not have a mask, one will be given to you upon arrival. For doctor visits, patients may have 1 support person aged 50 or older with them. For treatment visits, patients cannot have anyone with them due to current Covid guidelines and our immunocompromised population. Cyclophosphamide Injection What is this medication? CYCLOPHOSPHAMIDE (sye kloe FOSS fa mide) is a chemotherapy drug. It slows the growth of cancer cells. This medicine is used to treat many types of cancer like lymphoma, myeloma, leukemia, breast cancer, and ovarian cancer, to name afew. This medicine may be used for other purposes; ask your health care  provider orpharmacist if you have questions. COMMON BRAND NAME(S): Cytoxan, Neosar What should I tell my care team before I take this medication? They need to know if you have any of these conditions: heart disease history of irregular heartbeat infection kidney  disease liver disease low blood counts, like white cells, platelets, or red blood cells on hemodialysis recent or ongoing radiation therapy scarring or thickening of the lungs trouble passing urine an unusual or allergic reaction to cyclophosphamide, other medicines, foods, dyes, or preservatives pregnant or trying to get pregnant breast-feeding How should I use this medication? This drug is usually given as an injection into a vein or muscle or by infusion into a vein. It is administered in a hospital or clinic by a specially trainedhealth care professional. Talk to your pediatrician regarding the use of this medicine in children.Special care may be needed. Overdosage: If you think you have taken too much of this medicine contact apoison control center or emergency room at once. NOTE: This medicine is only for you. Do not share this medicine with others. What if I miss a dose? It is important not to miss your dose. Call your doctor or health careprofessional if you are unable to keep an appointment. What may interact with this medication? amphotericin B azathioprine certain antivirals for HIV or hepatitis certain medicines for blood pressure, heart disease, irregular heart beat certain medicines that treat or prevent blood clots like warfarin certain other medicines for cancer cyclosporine etanercept indomethacin medicines that relax muscles for surgery medicines to increase blood counts metronidazole This list may not describe all possible interactions. Give your health care provider a list of all the medicines, herbs, non-prescription drugs, or dietary supplements you use. Also tell them if you smoke, drink alcohol, or use illegaldrugs. Some items may interact with your medicine. What should I watch for while using this medication? Your condition will be monitored carefully while you are receiving thismedicine. You may need blood work done while you are taking this  medicine. Drink water or other fluids as directed. Urinate often, even at night. Some products may contain alcohol. Ask your health care professional if this medicine contains alcohol. Be sure to tell all health care professionals you are taking this medicine. Certain medicines, like metronidazole and disulfiram, can cause an unpleasant reaction when taken with alcohol. The reaction includes flushing, headache, nausea, vomiting, sweating, and increased thirst. Thereaction can last from 30 minutes to several hours. Do not become pregnant while taking this medicine or for 1 year after stopping it. Women should inform their health care professional if they wish to become pregnant or think they might be pregnant. Men should not father a child while taking this medicine and for 4 months after stopping it. There is potential for serious side effects to an unborn child. Talk to your health care professionalfor more information. Do not breast-feed an infant while taking this medicine or for 1 week afterstopping it. This medicine has caused ovarian failure in some women. This medicine may make it more difficult to get pregnant. Talk to your health care professional if [place] concerned about your fertility. This medicine has caused decreased sperm counts in some men. This may make it more difficult to father a child. Talk to your health care professional if [place] concerned about your fertility. Call your health care professional for advice if you get a fever, chills, or sore throat, or other symptoms of a cold or flu. Do not treat yourself. This medicine decreases your body's  ability to fight infections. Try to avoid beingaround people who are sick. Avoid taking medicines that contain aspirin, acetaminophen, ibuprofen, naproxen, or ketoprofen unless instructed by your health care professional.These medicines may hide a fever. Talk to your health care professional about your risk of cancer. You may bemore at risk for  certain types of cancer if you take this medicine. If you are going to need surgery or other procedure, tell your health careprofessional that you are using this medicine. Be careful brushing or flossing your teeth or using a toothpick because you may get an infection or bleed more easily. If you have any dental work done, Primary school teacher you are receiving this medicine. What side effects may I notice from receiving this medication? Side effects that you should report to your doctor or health care professionalas soon as possible: allergic reactions like skin rash, itching or hives, swelling of the face, lips, or tongue breathing problems nausea, vomiting signs and symptoms of bleeding such as bloody or black, tarry stools; red or dark brown urine; spitting up blood or brown material that looks like coffee grounds; red spots on the skin; unusual bruising or bleeding from the eyes, gums, or nose signs and symptoms of heart failure like fast, irregular heartbeat, sudden weight gain; swelling of the ankles, feet, hands signs and symptoms of infection like fever; chills; cough; sore throat; pain or trouble passing urine signs and symptoms of kidney injury like trouble passing urine or change in the amount of urine signs and symptoms of liver injury like dark yellow or brown urine; general ill feeling or flu-like symptoms; light-colored stools; loss of appetite; nausea; right upper belly pain; unusually weak or tired; yellowing of the eyes or skin Side effects that usually do not require medical attention (report to yourdoctor or health care professional if they continue or are bothersome): confusion decreased hearing diarrhea facial flushing hair loss headache loss of appetite missed menstrual periods signs and symptoms of low red blood cells or anemia such as unusually weak or tired; feeling faint or lightheaded; falls skin discoloration This list may not describe all possible side effects. Call  your doctor for medical advice about side effects. You may report side effects to FDA at1-800-FDA-1088. Where should I keep my medication? This drug is given in a hospital or clinic and will not be stored at home. NOTE: This sheet is a summary. It may not cover all possible information. If you have questions about this medicine, talk to your doctor, pharmacist, orhealth care provider.  2022 Elsevier/Gold Standard (2019-03-23 09:53:29) Vincristine injection What is this medication? VINCRISTINE (vin KRIS teen) is a chemotherapy drug. It slows the growth of cancer cells. This medicine is used to treat many types of cancer like Hodgkin's disease, leukemia, non-Hodgkin's lymphoma, neuroblastoma (braincancer), rhabdomyosarcoma, and Wilms' tumor. This medicine may be used for other purposes; ask your health care provider orpharmacist if you have questions. COMMON BRAND NAME(S): Oncovin, Vincasar PFS What should I tell my care team before I take this medication? They need to know if you have any of these conditions: blood disorders gout infection (especially chickenpox, cold sores, or herpes) kidney disease liver disease lung disease nervous system disease like Charcot-Marie-Tooth (CMT) recent or ongoing radiation therapy an unusual or allergic reaction to vincristine, other chemotherapy agents, other medicines, foods, dyes, or preservatives pregnant or trying to get pregnant breast-feeding How should I use this medication? This drug is given as an infusion into a vein. It is administered in a hospital  or clinic by a specially trained health care professional. If you have pain, swelling, burning, or any unusual feeling around the site of your injection,tell your health care professional right away. Talk to your pediatrician regarding the use of this medicine in children. Whilethis drug may be prescribed for selected conditions, precautions do apply. Overdosage: If you think you have taken too much  of this medicine contact apoison control center or emergency room at once. NOTE: This medicine is only for you. Do not share this medicine with others. What if I miss a dose? It is important not to miss your dose. Call your doctor or health careprofessional if you are unable to keep an appointment. What may interact with this medication? certain medicines for fungal infections like itraconazole, ketoconazole, posaconazole, voriconazole certain medicines for seizures like phenytoin This list may not describe all possible interactions. Give your health care provider a list of all the medicines, herbs, non-prescription drugs, or dietary supplements you use. Also tell them if you smoke, drink alcohol, or use illegaldrugs. Some items may interact with your medicine. What should I watch for while using this medication? This drug may make you feel generally unwell. This is not uncommon, as chemotherapy can affect healthy cells as well as cancer cells. Report any side effects. Continue your course of treatment even though you feel ill unless yourdoctor tells you to stop. You may need blood work done while you are taking this medicine. This medicine will cause constipation. Try to have a bowel movement at least every 2 to 3 days. If you do not have a bowel movement for 3 days, call yourdoctor or health care professional. In some cases, you may be given additional medicines to help with side effects.Follow all directions for their use. Do not become pregnant while taking this medicine. Women should inform their doctor if they wish to become pregnant or think they might be pregnant. There is a potential for serious side effects to an unborn child. Talk to your health care professional or pharmacist for more information. Do not breast-feed aninfant while taking this medicine. This medicine may make it more difficult to get pregnant or to father a child.Talk to your healthcare professional if you are concerned  about your fertility. What side effects may I notice from receiving this medication? Side effects that you should report to your doctor or health care professionalas soon as possible: allergic reactions like skin rash, itching or hives, swelling of the face, lips, or tongue breathing problems confusion or changes in emotions or moods constipation cough mouth sores muscle weakness nausea and vomiting pain, swelling, redness or irritation at the injection site pain, tingling, numbness in the hands or feet problems with balance, talking, walking seizures stomach pain trouble passing urine or change in the amount of urine Side effects that usually do not require medical attention (report to yourdoctor or health care professional if they continue or are bothersome): diarrhea hair loss jaw pain loss of appetite This list may not describe all possible side effects. Call your doctor for medical advice about side effects. You may report side effects to FDA at1-800-FDA-1088. Where should I keep my medication? This drug is given in a hospital or clinic and will not be stored at home. NOTE: This sheet is a summary. It may not cover all possible information. If you have questions about this medicine, talk to your doctor, pharmacist, orhealth care provider.  2022 Elsevier/Gold Standard (2019-05-19 17:05:13) Doxorubicin injection What is this medication? DOXORUBICIN (dox  oh ROO bi sin) is a chemotherapy drug. It is used to treat many kinds of cancer like leukemia, lymphoma, neuroblastoma, sarcoma, and Wilms' tumor. It is also used to treat bladder cancer, breast cancer, lungcancer, ovarian cancer, stomach cancer, and thyroid cancer. This medicine may be used for other purposes; ask your health care provider orpharmacist if you have questions. COMMON BRAND NAME(S): Adriamycin, Adriamycin PFS, Adriamycin RDF, Rubex What should I tell my care team before I take this medication? They need to know if  you have any of these conditions: heart disease history of low blood counts caused by a medicine liver disease recent or ongoing radiation therapy an unusual or allergic reaction to doxorubicin, other chemotherapy agents, other medicines, foods, dyes, or preservatives pregnant or trying to get pregnant breast-feeding How should I use this medication? This drug is given as an infusion into a vein. It is administered in a hospital or clinic by a specially trained health care professional. If you have pain, swelling, burning or any unusual feeling around the site of your injection,tell your health care professional right away. Talk to your pediatrician regarding the use of this medicine in children.Special care may be needed. Overdosage: If you think you have taken too much of this medicine contact apoison control center or emergency room at once. NOTE: This medicine is only for you. Do not share this medicine with others. What if I miss a dose? It is important not to miss your dose. Call your doctor or health careprofessional if you are unable to keep an appointment. What may interact with this medication? This medicine may interact with the following medications: 6-mercaptopurine paclitaxel phenytoin St. John's Wort trastuzumab verapamil This list may not describe all possible interactions. Give your health care provider a list of all the medicines, herbs, non-prescription drugs, or dietary supplements you use. Also tell them if you smoke, drink alcohol, or use illegaldrugs. Some items may interact with your medicine. What should I watch for while using this medication? This drug may make you feel generally unwell. This is not uncommon, as chemotherapy can affect healthy cells as well as cancer cells. Report any side effects. Continue your course of treatment even though you feel ill unless yourdoctor tells you to stop. There is a maximum amount of this medicine you should receive throughout  your life. The amount depends on the medical condition being treated and your overall health. Your doctor will watch how much of this medicine you receive inyour lifetime. Tell your doctor if you have taken this medicine before. You may need blood work done while you are taking this medicine. Your urine may turn red for a few days after your dose. This is not blood. Ifyour urine is dark or brown, call your doctor. In some cases, you may be given additional medicines to help with side effects.Follow all directions for their use. Call your doctor or health care professional for advice if you get a fever, chills or sore throat, or other symptoms of a cold or flu. Do not treat yourself. This drug decreases your body's ability to fight infections. Try toavoid being around people who are sick. This medicine may increase your risk to bruise or bleed. Call your doctor orhealth care professional if you notice any unusual bleeding. Talk to your doctor about your risk of cancer. You may be more at risk forcertain types of cancers if you take this medicine. Do not become pregnant while taking this medicine or for 6 months after stopping  it. Women should inform their doctor if they wish to become pregnant or think they might be pregnant. Men should not father a child while taking this medicine and for 6 months after stopping it. There is a potential for serious side effects to an unborn child. Talk to your health care professional or pharmacist for more information. Do not breast-feed an infant while takingthis medicine. This medicine has caused ovarian failure in some women and reduced sperm counts in some men This medicine may interfere with the ability to have a child. Talk with your doctor or health care professional if you are concerned about yourfertility. This medicine may cause a decrease in Co-Enzyme Q-10. You should make sure that you get enough Co-Enzyme Q-10 while you are taking this medicine. Discuss  thefoods you eat and the vitamins you take with your health care professional. What side effects may I notice from receiving this medication? Side effects that you should report to your doctor or health care professionalas soon as possible: allergic reactions like skin rash, itching or hives, swelling of the face, lips, or tongue breathing problems chest pain fast or irregular heartbeat low blood counts - this medicine may decrease the number of white blood cells, red blood cells and platelets. You may be at increased risk for infections and bleeding. pain, redness, or irritation at site where injected signs of infection - fever or chills, cough, sore throat, pain or difficulty passing urine signs of decreased platelets or bleeding - bruising, pinpoint red spots on the skin, black, tarry stools, blood in the urine swelling of the ankles, feet, hands tiredness weakness Side effects that usually do not require medical attention (report to yourdoctor or health care professional if they continue or are bothersome): diarrhea hair loss mouth sores nail discoloration or damage nausea red colored urine vomiting This list may not describe all possible side effects. Call your doctor for medical advice about side effects. You may report side effects to FDA at1-800-FDA-1088. Where should I keep my medication? This drug is given in a hospital or clinic and will not be stored at home. NOTE: This sheet is a summary. It may not cover all possible information. If you have questions about this medicine, talk to your doctor, pharmacist, orhealth care provider.  2022 Elsevier/Gold Standard (2017-01-30 11:01:26) Rituximab Injection What is this medication? RITUXIMAB (ri TUX i mab) is a monoclonal antibody. It is used to treat certain types of cancer like non-Hodgkin lymphoma and chronic lymphocytic leukemia. It is also used to treat rheumatoid arthritis, granulomatosis with polyangiitis,microscopic  polyangiitis, and pemphigus vulgaris. This medicine may be used for other purposes; ask your health care provider orpharmacist if you have questions. COMMON BRAND NAME(S): RIABNI, Rituxan, RUXIENCE What should I tell my care team before I take this medication? They need to know if you have any of these conditions: chest pain heart disease infection especially a viral infection such as chickenpox, cold sores, hepatitis B, or herpes immune system problems irregular heartbeat or rhythm kidney disease low blood counts (white cells, platelets, or red cells) lung disease recent or upcoming vaccine an unusual or allergic reaction to rituximab, other medicines, foods, dyes, or preservatives pregnant or trying to get pregnant breast-feeding How should I use this medication? This medicine is injected into a vein. It is given by a health care provider ina hospital or clinic setting. A special MedGuide will be given to you before each treatment. Be sure to readthis information carefully each time. Talk to your health  care provider about the use of this medicine in children. While this drug may be prescribed for children as young as 6 months forselected conditions, precautions do apply. Overdosage: If you think you have taken too much of this medicine contact apoison control center or emergency room at once. NOTE: This medicine is only for you. Do not share this medicine with others. What if I miss a dose? Keep appointments for follow-up doses. It is important not to miss your dose.Call your health care provider if you are unable to keep an appointment. What may interact with this medication? Do not take this medicine with any of the following medicines: live vaccines This medicine may also interact with the following medicines: cisplatin This list may not describe all possible interactions. Give your health care provider a list of all the medicines, herbs, non-prescription drugs, or dietary  supplements you use. Also tell them if you smoke, drink alcohol, or use illegaldrugs. Some items may interact with your medicine. What should I watch for while using this medication? Your condition will be monitored carefully while you are receiving thismedicine. You may need blood work done while you are taking this medicine. This medicine can cause serious infusion reactions. To reduce the risk your health care provider may give you other medicines to take before receiving thisone. Be sure to follow the directions from your health care provider. This medicine may increase your risk of getting an infection. Call your health care provider for advice if you get a fever, chills, sore throat, or other symptoms of a cold or flu. Do not treat yourself. Try to avoid being aroundpeople who are sick. Call your health care provider if you are around anyone with measles,chickenpox, or if you develop sores or blisters that do not heal properly. Avoid taking medicines that contain aspirin, acetaminophen, ibuprofen, naproxen, or ketoprofen unless instructed by your health care provider. Thesemedicines may hide a fever. This medicine may cause serious skin reactions. They can happen weeks to months after starting the medicine. Contact your health care provider right away if you notice fevers or flu-like symptoms with a rash. The rash may be red or purple and then turn into blisters or peeling of the skin. Or, you might notice a red rash with swelling of the face, lips or lymph nodes in your neck or underyour arms. In some patients, this medicine may cause a serious brain infection that may cause death. If you have any problems seeing, thinking, speaking, walking, or standing, tell your healthcare professional right away. If you cannot reachyour healthcare professional, urgently seek other source of medical care. Do not become pregnant while taking this medicine or for at least 12 months after stopping it. Women should  inform their health care provider if they wish to become pregnant or think they might be pregnant. There is potential for serious harm to an unborn child. Talk to your health care provider for more information. Women should use a reliable form of birth control while taking this medicine and for 12 months after stopping it. Do not breast-feed whiletaking this medicine or for at least 6 months after stopping it. What side effects may I notice from receiving this medication? Side effects that you should report to your health care provider as soon aspossible: allergic reactions (skin rash, itching or hives; swelling of the face, lips, or tongue) diarrhea edema (sudden weight gain; swelling of the ankles, feet, hands or other unusual swelling; trouble breathing) fast, irregular heartbeat heart attack (trouble breathing;  pain or tightness in the chest, neck, back or arms; unusually weak or tired) infection (fever, chills, cough, sore throat, pain or trouble passing urine) kidney injury (trouble passing urine or change in the amount of urine) liver injury (dark yellow or brown urine; general ill feeling or flu-like symptoms; loss of appetite, right upper belly pain; unusually weak or tired, yellowing of the eyes or skin) low blood pressure (dizziness; feeling faint or lightheaded, falls; unusually weak or tired) low red blood cell counts (trouble breathing; feeling faint; lightheaded, falls; unusually weak or tired) mouth sores redness, blistering, peeling, or loosening of the skin, including inside the mouth stomach pain unusual bruising or bleeding wheezing (trouble breathing with loud or whistling sounds) vomiting Side effects that usually do not require medical attention (report to yourhealth care provider if they continue or are bothersome): headache joint pain muscle cramps, pain nausea This list may not describe all possible side effects. Call your doctor for medical advice about side  effects. You may report side effects to FDA at1-800-FDA-1088. Where should I keep my medication? This medicine is given in a hospital or clinic. It will not be stored at home. NOTE: This sheet is a summary. It may not cover all possible information. If you have questions about this medicine, talk to your doctor, pharmacist, orhealth care provider.  2022 [employer] (2020-06-09 15:47:26)

## 2020-12-26 NOTE — Progress Notes (Signed)
[place] NOTE  Patient Care Team: System, Provider Not In as PCP - General [name], RN as Oncology Nurse Navigator  CHIEF COMPLAINTS/PURPOSE OF CONSULTATION: lymphoma   #  Oncology History Overview Note  #June 2022-Large cell lymphoma-s/p biopsy left supraclavicular lymph node.  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  FISH studies pending. Recommend hold off bone marrow biopsy. PET June 9th 2022-Central left hilar and subcarinal mass with metastatic disease to the left lower lobe and substantial mediastinal and bilateral hilar adenopathy as well as mild lower neck level V adenopathy on the left. Revised IPI-good risk .  # June 27th, 2022- R-CHOP cycle #1.  # SURVIVORSHIP:   # GENETICS:   DIAGNOSIS:   STAGE:         ;  GOALS:  CURRENT/MOST RECENT THERAPY :     Large cell lymphoma of extranodal and solid organ sites [facility])  12/22/2020 Initial Diagnosis   Large cell lymphoma of extranodal and solid organ sites [facility])    12/26/2020 -  Chemotherapy    Patient is on Treatment Plan: NON-HODGKINS LYMPHOMA R-CHOP Q21D          HISTORY OF PRESENTING ILLNESS:  Curtis Manning. 50 y.o.  male patient stage IV diffuse large B-cell lymphoma is here to proceed with chemotherapy cycle #1 today.  Patient noted to have complete resolution of the rash on his abdomen/back.  He is currently s/p steroids.  He has taken his premedications.  Denies any hoarseness of voice.  Denies any chest pain.  Denies any nausea vomiting abdominal pain.  Continues have difficulty breathing mild cough.   Review of Systems  Constitutional:  Positive for malaise/fatigue and weight loss. Negative for chills, diaphoresis and fever.  HENT:  Positive for sore throat. Negative for nosebleeds.   Eyes:  Negative for double vision.  Respiratory:  Positive for shortness of breath. Negative for cough, hemoptysis, sputum production and wheezing.   Cardiovascular:   Negative for chest pain, palpitations, orthopnea and leg swelling.  Gastrointestinal:  Negative for abdominal pain, blood in stool, constipation, diarrhea, heartburn, melena, nausea and vomiting.  Genitourinary:  Negative for dysuria, frequency and urgency.  Musculoskeletal:  Negative for back pain and joint pain.  Skin: Negative.  Negative for itching and rash.  Neurological:  Negative for dizziness, tingling, focal weakness, weakness and headaches.  Endo/Heme/Allergies:  Does not bruise/bleed easily.  Psychiatric/Behavioral:  Negative for depression. The patient is not nervous/anxious and does not have insomnia.     MEDICAL HISTORY:  Past Medical History:  Diagnosis Date  . A-fib ([place])   . Asthma   . GERD (gastroesophageal reflux disease)   . Lung mass   . Pleural effusion     SURGICAL HISTORY: Past Surgical History:  Procedure Laterality Date  . IR IMAGING GUIDED PORT INSERTION  12/16/2020    SOCIAL HISTORY: Social History   Socioeconomic History  . Marital status: Married    Spouse name: Not on file  . Number of children: Not on file  . Years of education: Not on file  . Highest education level: Not on file  Occupational History  . Not on file  Tobacco Use  . Smoking status: Former    Packs/day: 0.50    Years: 20.00    Pack years: 10.00    Types: Cigarettes    Quit date: 11/25/2020    Years since quitting: 0.0  . Smokeless tobacco: Never  Vaping Use  . Vaping Use:  Never used  Substance and Sexual Activity  . Alcohol use: Yes    Comment: "5 to 6 shots on weekends"  . Drug use: Never  . Sexual activity: Not on file  Other Topics Concern  . Not on file  Social History Narrative   Patient is a active smoker.;  Also liquor.  Lives with his wife in [place].  No children.  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 High blood pressure Mother   . Heart Problems Father   . Stomach cancer Paternal Grandmother     ALLERGIES:  has No Known Allergies.  MEDICATIONS:  Current Outpatient Medications  Medication Sig Dispense Refill  . acyclovir (ZOVIRAX) 400 MG tablet Take 1 tablet (400 mg total) by mouth 2 (two) times daily.  [to prevent shingles] 60 tablet 3  . albuterol (PROVENTIL) (2.5 MG/3ML) 0.083% nebulizer solution Inhale 3 mLs into the lungs every 6 (six) hours as needed.    Marland Kitchen albuterol (VENTOLIN HFA) 108 (90 Base) MCG/ACT inhaler Inhale 1-2 puffs into the lungs every 4 (four) hours as needed for wheezing or shortness of breath. 1 each 2  . allopurinol (ZYLOPRIM) 300 MG tablet Take 1 tablet (300 mg total) by mouth 2 (two) times daily. 120 tablet 0  . chlorpheniramine-HYDROcodone (TUSSIONEX) 10-8 MG/5ML SUER Take 5 mLs by mouth every 12 (twelve) hours as needed for cough. (Patient not taking: No sig reported)    . cholecalciferol (VITAMIN D) 25 MCG tablet Take 1 tablet (1,000 Units total) by mouth daily. Can take any over-the-counter.    . diltiazem (CARDIZEM CD) 240 MG 24 hr capsule Take 1 capsule (240 mg total) by mouth daily. 30 capsule 0  . diphenhydrAMINE (BENADRYL) 25 MG tablet Take 25 mg by mouth every 6 (six) hours as needed.    . feeding supplement (ENSURE ENLIVE / ENSURE PLUS) LIQD Take 237 mLs by mouth 3 (three) times daily between meals. 237 mL 12  . HYDROcodone-acetaminophen (NORCO/VICODIN) 5-325 MG tablet Take 1 tablet by mouth every 6 (six) hours as needed.    Marland Kitchen ipratropium-albuterol (DUONEB) 0.5-2.5 (3) MG/3ML SOLN Take 3 mLs by nebulization every 6 (six) hours as needed. 75 mL 2  . lidocaine-prilocaine (EMLA) cream Apply 30 -45 mins prior to port access. 30 g 0  . metoprolol tartrate (LOPRESSOR) 50 MG tablet Take 1  tablet (50 mg total) by mouth every 6 (six) hours. 120 tablet 0  . montelukast (SINGULAIR) 10 MG tablet Start 2 days prior to infusion as direction by your provider 30 tablet 0  . Multiple Vitamin (MULTIVITAMIN WITH MINERALS) TABS tablet Take 1 tablet by mouth daily.    Marland Kitchen omeprazole (PRILOSEC) 10 MG capsule Take 20 mg by mouth daily.    . ondansetron (ZOFRAN) 4 MG tablet Take 2 tablets (8 mg) by mouth every 8 hours as needed for nausea or vomiting 80 tablet 1  . predniSONE (DELTASONE) 20 MG tablet Take 5 tablets (100 mg) by mouth daily for 5 days. Start on the day chemo starts. 25 tablet 5  . prochlorperazine (COMPAZINE) 10 MG tablet Take 1 tablet (10 mg total) by mouth every  6 (six) hours as needed for nausea or vomiting. 40 tablet 1   No current facility-administered medications for this visit.   Facility-Administered Medications Ordered in Other Visits  Medication Dose Route Frequency Provider Last Rate Last Admin  . montelukast (SINGULAIR) tablet 10 mg  10 mg Oral QHS Cammie Sickle, MD   10 mg at 12/26/20 0857  . sodium chloride flush (NS) 0.9 % injection 10 mL  10 mL Intravenous PRN Cammie Sickle, MD   10 mL at 12/26/20 0835      .  PHYSICAL EXAMINATION: ECOG PERFORMANCE STATUS: 1 - Symptomatic but completely ambulatory  There were no vitals filed for this visit.  There were no vitals filed for this visit.   Physical Exam Vitals and nursing note reviewed.  Constitutional:      Comments: Ambulating: Independently; alone.  HENT:     Head: Normocephalic and atraumatic.     Mouth/Throat:     Pharynx: Oropharynx is clear.  Eyes:     Extraocular Movements: Extraocular movements intact.     Pupils: Pupils are equal, round, and reactive to light.  Cardiovascular:     Rate and Rhythm: Normal rate and regular rhythm.  Pulmonary:     Comments: Decreased breath sounds bilaterally left more than right.  Abdominal:     Palpations: Abdomen is soft.  Musculoskeletal:         General: Normal range of motion.     Cervical back: Normal range of motion.  Skin:    General: Skin is warm.  Neurological:     General: No focal deficit present.     Mental Status: He is alert and oriented to person, place, and time.  Psychiatric:        Behavior: Behavior normal.        Judgment: Judgment normal.     LABORATORY DATA:  I have reviewed the data as listed Lab Results  Component Value Date   WBC 11.1 (H) 12/22/2020   HGB 12.2 (L) 12/22/2020   HCT 36.4 (L) 12/22/2020   MCV 91.2 12/22/2020   PLT 240 12/22/2020   Recent Labs    11/11/20 1702 11/12/20 0439 11/30/20 0601 12/13/20 1216 12/22/20 1549  NA 133*   < > 138 134* 139  K 4.3   < > 4.0 4.3 3.5  CL 99   < > 99 100 104  CO2 23   < > 29 26 29   GLUCOSE 124*   < > 118* 97 109*  BUN 9   < > 13 22* 14  CREATININE 0.44*   < > 0.60* 0.63 0.59*  CALCIUM 9.0   < > 8.8* 8.5* 9.3  GFRNONAA >60   < > >60 >60 >60  PROT 6.9  --   --  6.1* 7.1  ALBUMIN 2.9*  --   --  3.3* 3.4*  AST 25  --   --  22 24  ALT 13  --   --  22 24  ALKPHOS 76  --   --  70 70  BILITOT 0.5  --   --  0.8 0.5   < > = values in this interval not displayed.    RADIOGRAPHIC STUDIES: I have personally reviewed the radiological images as listed and agreed with the findings in the report. DG Chest 2 View  Result Date: 12/01/2020 CLINICAL DATA:  Status post biopsy EXAM: CHEST - 2 VIEW COMPARISON:  November 30, 2020 FINDINGS: No pneumothorax is appreciable by radiography. There  is a persistent residual pleural effusion on the left. Apparent masses in the left upper and left lower lobe regions with surrounding pneumonitis remains stable. Right lung is clear. Heart size and pulmonary vascularity normal. No adenopathy. No bone lesions. IMPRESSION: No pneumothorax evident by radiography. Mass lesions with surrounding pneumonitis on the left remain essentially stable with small left pleural effusion also noted. No new opacity evident. Right lung clear.  Heart size normal. Electronically Signed   By: Lowella Grip III M.D.   On: 12/01/2020 09:37   MR Brain W Wo Contrast  Result Date: 12/16/2020 CLINICAL DATA:  Lung mass.  Evaluation for metastatic disease. EXAM: MRI HEAD WITHOUT AND WITH CONTRAST TECHNIQUE: Multiplanar, multiecho pulse sequences of the brain and surrounding structures were obtained without and with intravenous contrast. CONTRAST:  50m GADAVIST GADOBUTROL 1 MMOL/ML IV SOLN COMPARISON:  None. FINDINGS: Brain: There is no evidence of an acute infarct, intracranial hemorrhage, mass, midline shift, or extra-axial fluid collection. The ventricles and sulci are normal. The brain is normal in signal. No abnormal brain parenchymal or meningeal enhancement is identified. A small developmental venous anomaly is incidentally noted laterally in the right cerebellar hemisphere. Vascular: Major intracranial vascular flow voids are preserved. Skull and upper cervical spine: Unremarkable bone marrow signal. Sinuses/Orbits: Unremarkable orbits. Minimal mucosal thickening in the left maxillary sinus. Trace bilateral mastoid fluid. Other: Small right frontal scalp lipoma. IMPRESSION: Unremarkable appearance of the brain. No evidence of intracranial metastases. Electronically Signed   By: ALogan BoresM.D.   On: 12/16/2020 12:06   NM PET Image Initial (PI) Skull Base To Thigh  Result Date: 12/09/2020 CLINICAL DATA:  Initial treatment strategy for non-small cell lung cancer. EXAM: NUCLEAR MEDICINE PET SKULL BASE TO THIGH TECHNIQUE: 7.7 mCi F-18 FDG was injected intravenously. Full-ring PET imaging was performed from the skull base to thigh after the radiotracer. CT data was obtained and used for attenuation correction and anatomic localization. Fasting blood glucose: 121 mg/dl COMPARISON:  CT chest 11/25/2020 FINDINGS: Mediastinal blood pool activity: SUV max 1.4 Liver activity: SUV max NA NECK: A 0.7 cm left level V lymph node on image 60 of series 3 has a  maximum SUV of 6.0. Additional lymph nodes straddle between level IV and the supraclavicular region, and will be counted as supraclavicular nodes below. Incidental CT findings: none CHEST: Hypermetabolic supraclavicular, paratracheal, prevascular, AP window, left paratracheal, left paraspinal, bilateral hilar, left infrahilar, lower paraesophageal, and subcarinal adenopathy noted. Right supraclavicular node 1.4 cm in short axis on image 66 series 3 with maximum SUV 6.7. Anterior AP window lymph node 2.8 cm in short axis on image 100 series 3, maximum SUV 9.0. Index right lower paratracheal lymph node 2.0 cm in short axis on image 99 series 3, maximum SUV 8.9. Subcarinal mass noted involving the left hilum/tracheobronchial tree and measuring about 10.1 by 6.0 cm with maximum SUV 8.4. Left lower lobe mass 8.8 by 4.6 cm on image 113 series 3, centrally photopenic but high activity along the margins with maximum SUV up to 4.4. Consolidation/drowned lung appearance in a substantial portion of the left upper lobe posteriorly with only very low-grade metabolic activity, maximum SUV 1.4. Anteriorly in the left lower lobe adjacent to the major fissure, a 2.9 by 1.9 cm nodule on image 131 series 3 is observed with maximum SUV 2.3. There has been some interval clearing of some of the consolidation anteriorly in the left upper lobe, with some mild residual nodularity in the left upper lobe which  for the most part is without significant hypermetabolic activity. A bandlike region of volume loss in the lingula is present with maximum SUV of about 1.6. Some of the ground-glass density in the left lower lobe has intervally cleared. Small left pleural effusion without specific indicators of malignant pleural effusion. Incidental CT findings: none ABDOMEN/PELVIS: No significant abnormal hypermetabolic activity in this region. Incidental CT findings: Contracted gallbladder with questionable small gallstones. Aortoiliac atherosclerotic  vascular disease. SKELETON: No significant abnormal hypermetabolic activity in this region. Incidental CT findings: Degenerative disc disease at L5-S1. IMPRESSION: 1. Central left hilar and subcarinal mass with metastatic disease to the left lower lobe and substantial mediastinal and bilateral hilar adenopathy as well as mild lower neck level V adenopathy on the left. Appearance favors T4 N3 M0 disease (assuming the left pleural effusion is not malignant) compatible with stage III C non-small cell lung cancer. 2. Improved appearance of consolidation in the left upper lobe. 3. Small left pleural effusion. 4. Suspected cholelithiasis. 5.  Aortic Atherosclerosis (ICD10-I70.0). Electronically Signed   By: Van Clines M.D.   On: 12/09/2020 09:10   DG Chest Port 1 View  Result Date: 11/30/2020 CLINICAL DATA:  Follow-up left pneumothorax after lung biopsy. EXAM: PORTABLE CHEST 1 VIEW COMPARISON:  CTA chest and chest x-ray dated Nov 25, 2020. FINDINGS: Normal heart size. Unchanged large left lung mass and postobstructive pneumonia. The right lung remains clear. Unchanged small left pleural effusion. Trace left pneumothorax seen on post biopsy CT is not readily identified by x-ray. No acute osseous abnormality. IMPRESSION: 1. Trace left pneumothorax seen on post biopsy CT is not readily identified by x-ray. No enlarging pneumothorax. Electronically Signed   By: Titus Dubin M.D.   On: 11/30/2020 12:11   ECHOCARDIOGRAM COMPLETE  Result Date: 11/29/2020    ECHOCARDIOGRAM REPORT   Patient Name:   Curtis Manning. Date of Exam: 11/28/2020 Medical Rec #:  [id number]          Height:       68.0 in Accession #:    [id number]         Weight:       149.6 lb Date of Birth:  1971-02-12           BSA:          1.806 m Patient Age:    40 years           BP:           115/80 mmHg Patient Gender: M                  HR:           106 bpm. Exam Location:  [facility] Procedure: 2D Echo, Cardiac Doppler and Color Doppler Indications:      Atrial Fibrillation I48.91  History:         Patient has no prior history of Echocardiogram examinations.  Sonographer:     Alyse Low Roar Referring Phys:  [place] Diagnosing Phys: Neoma Laming MD IMPRESSIONS  1. Left ventricular ejection fraction, by estimation, is 50 to 55%. The left ventricle has low normal function. The left ventricle demonstrates global hypokinesis. The left ventricular internal cavity size was mildly dilated. Left ventricular diastolic parameters are consistent with Grade I diastolic dysfunction (impaired relaxation).  2. Right ventricular systolic function is normal. The right ventricular size is moderately enlarged.  3. Left atrial size was moderately dilated.  4. Right atrial size was moderately dilated.  5. The  mitral valve is normal in structure. Mild mitral valve regurgitation. No evidence of mitral stenosis.  6. The aortic valve is normal in structure. Aortic valve regurgitation is not visualized. No aortic stenosis is present.  7. The inferior vena cava is normal in size with greater than 50% respiratory variability, suggesting right atrial pressure of 3 mmHg. FINDINGS  Left Ventricle: Left ventricular ejection fraction, by estimation, is 50 to 55%. The left ventricle has low normal function. The left ventricle demonstrates global hypokinesis. The left ventricular internal cavity size was mildly dilated. There is no left ventricular hypertrophy. Left ventricular diastolic parameters are consistent with Grade I diastolic dysfunction (impaired relaxation). Right Ventricle: The right ventricular size is moderately enlarged. No increase in right ventricular wall thickness. Right ventricular systolic function is normal. Left Atrium: Left atrial size was moderately dilated. Right Atrium: Right atrial size was moderately dilated. Pericardium: There is no evidence of pericardial effusion. Mitral Valve: The mitral valve is normal in structure. Mild mitral valve regurgitation. No  evidence of mitral valve stenosis. Tricuspid Valve: The tricuspid valve is normal in structure. Tricuspid valve regurgitation is mild . No evidence of tricuspid stenosis. Aortic Valve: The aortic valve is normal in structure. Aortic valve regurgitation is not visualized. No aortic stenosis is present. Aortic valve peak gradient measures 8.2 mmHg. Pulmonic Valve: The pulmonic valve was normal in structure. Pulmonic valve regurgitation is not visualized. No evidence of pulmonic stenosis. Aorta: The aortic root is normal in size and structure. Venous: The inferior vena cava is normal in size with greater than 50% respiratory variability, suggesting right atrial pressure of 3 mmHg. IAS/Shunts: No atrial level shunt detected by color flow Doppler.  LEFT VENTRICLE PLAX 2D LVIDd:         3.55 cm  Diastology LVIDs:         2.58 cm  LV e' medial:    10.00 cm/s LV PW:         0.97 cm  LV E/e' medial:  11.6 LV IVS:        0.99 cm  LV e' lateral:   12.50 cm/s LVOT diam:     1.80 cm  LV E/e' lateral: 9.3 LVOT Area:     2.54 cm  RIGHT VENTRICLE RV Mid diam:    2.66 cm RV S prime:     16.30 cm/s TAPSE (M-mode): 2.0 cm LEFT ATRIUM             Index       RIGHT ATRIUM           Index LA diam:        3.30 cm 1.83 cm/m  RA Area:     12.00 cm LA Vol (A2C):   35.4 ml 19.60 ml/m RA Volume:   25.10 ml  13.89 ml/m LA Vol (A4C):   21.9 ml 12.12 ml/m LA Biplane Vol: 29.1 ml 16.11 ml/m  AORTIC VALVE                PULMONIC VALVE AV Area (Vmax): 1.92 cm    PV Vmax:        0.96 m/s AV Vmax:        143.00 cm/s PV Peak grad:   3.7 mmHg AV Peak Grad:   8.2 mmHg    RVOT Peak grad: 2 mmHg LVOT Vmax:      108.00 cm/s  AORTA Ao Root diam: 2.40 cm MITRAL VALVE MV Area (PHT): 5.97 cm     SHUNTS MV Decel  Time: 127 msec     Systemic Diam: 1.80 cm MV E velocity: 116.00 cm/s MV A velocity: 70.80 cm/s MV E/A ratio:  1.64 MV A Prime:    6.8 cm/s Neoma Laming MD Electronically signed by Neoma Laming MD Signature Date/Time: 11/29/2020/9:01:47 AM     Final    [place] CORE BIOPSY (LYMPH NODES)  Result Date: 12/16/2020 INDICATION: Non-small cell lung cancer.  Multifocal lymphadenopathy. EXAM: ULTRASOUND GUIDED CORE NEEDLE BIOPSY OF ENLARGED LEFT NECK LYMPH NODE MEDICATIONS: None. ANESTHESIA/SEDATION: None PROCEDURE: The procedure, risks, benefits, and alternatives were explained to the patient. Questions regarding the procedure were encouraged and answered. The patient understands and consents to the procedure. The left neck skin was prepped with chlorhexidine in a sterile fashion, and a sterile drape was applied covering the operative field. A sterile gown and sterile gloves were used for the procedure. Local anesthesia was provided with 1% Lidocaine. Following local lidocaine administration, four 18 gauge cores were obtained from the enlarged left supraclavicular lymph node utilizing continuous ultrasound guidance. Samples were sent to pathology in sterile saline. Needle removed and hemostasis achieved with 2 minutes of manual compression. Post procedure ultrasound images showed no evidence of significant hemorrhage. COMPLICATIONS: None immediate. IMPRESSION: Ultrasound-guided core needle biopsy of enlarged left neck lymph node. Electronically Signed   By: Miachel Roux M.D.   On: 12/16/2020 15:43   IR IMAGING GUIDED PORT INSERTION  Result Date: 12/16/2020 INDICATION: Non-small cell lung cancer EXAM: IMPLANTED PORT A CATH PLACEMENT WITH ULTRASOUND AND FLUOROSCOPIC GUIDANCE MEDICATIONS: None ANESTHESIA/SEDATION: Moderate (conscious) sedation was employed during this procedure. A total of Versed 1 mg and Fentanyl 50 mcg was administered intravenously. Moderate Sedation Time: 18 minutes. The patient's level of consciousness and vital signs were monitored continuously by radiology nursing throughout the procedure under my direct supervision. FLUOROSCOPY TIME:  0.6 minutes (2.37 mGy) COMPLICATIONS: None immediate. PROCEDURE: The procedure, risks, benefits, and  alternatives were explained to the patient. Questions regarding the procedure were encouraged and answered. The patient understands and consents to the procedure. A timeout was performed prior to the initiation of the procedure. Patient positioned supine on the angiography table. Right neck and anterior upper chest prepped and draped in the usual sterile fashion. All elements of maximal sterile barrier were utilized including, cap, mask, sterile gown, sterile gloves, large sterile drape, hand scrubbing and 2% Chlorhexidine for skin cleaning. The right internal jugular vein was evaluated with ultrasound and shown to be patent. A permanent ultrasound image was obtained and placed in the patient's medical record. Local anesthesia was provided with 1% lidocaine with epinephrine. Using sterile gel and a sterile probe cover, the right internal jugular vein was entered with a 21 ga needle during real time ultrasound guidance. 0.018 inch guidewire placed and 21 ga needle exchanged for transitional dilator set. Utilizing fluoroscopy, 0.035 inch guidewire advanced through the needle without difficulty. Attention then turned to the right anterior upper chest. Following local lidocaine administration, a port pocket was created. The catheter was connected to the port and brought from the pocket to the venotomy site through a subcutaneous tunnel. The catheter was cut to size and inserted through the peel-away sheath. The catheter tip was positioned at the cavoatrial junction using fluoroscopic guidance. The port aspirated and flushed well. The port pocket was closed with deep and superficial absorbable suture. The port pocket incision and venotomy sites were also sealed with Dermabond. IMPRESSION: Successful placement of a right internal jugular approach power injectable Port-A-Cath. The catheter is ready  for immediate use. Electronically Signed   By: Miachel Roux M.D.   On: 12/16/2020 15:11   CT LUNG MASS BIOPSY  Result  Date: 11/30/2020 CLINICAL DATA:  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 The patient is not a candidate for bronchoscopy currently and request has been made for percutaneous biopsy of tumor in order to establish a diagnosis. EXAM: CT GUIDED CORE BIOPSY OF LEFT LOWER LOBE LUNG MASS ANESTHESIA/SEDATION: 1.0 mg IV Versed; 100 mcg IV Fentanyl Total Moderate Sedation Time:  19 minutes. The patient's level of consciousness and physiologic status were continuously monitored during the procedure by Radiology nursing. PROCEDURE: The procedure risks, benefits, and alternatives were explained to the patient. Questions regarding the procedure were encouraged and answered. The patient understands and consents to the procedure. A time-out was performed prior to initiating the procedure. CT was performed through the chest with the left side rolled up in an oblique position. The left chest wall was prepped with chlorhexidine in a sterile fashion, and a sterile drape was applied covering the operative field. A sterile gown and sterile gloves were used for the procedure. Local anesthesia was provided with 1% Lidocaine. A 17 gauge trocar needle was advanced to the level of a left lower lobe lung mass. After confirming needle tip position, 3 separate coaxial 18 gauge core biopsy samples were obtained and submitted in formalin. The BioSentry device was utilized in depositing a plug at the pleural entry site. Additional CT imaging was performed. COMPLICATIONS: Tiny left lateral pneumothorax. FINDINGS: The large mass of the left lower lobe was targeted for sampling measuring approximately 8.9 cm in greatest diameter. Solid tissue was obtained with core biopsy. After the procedure CT demonstrates a tiny left lateral pneumothorax. IMPRESSION: CT-guided core biopsy of 9 cm left lower lobe lung mass. The procedure was complicated by a tiny left lateral pneumothorax which  will be followed during recovery by chest x-ray. Electronically Signed   By: Aletta Edouard M.D.   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) #Large cell lymphoma-s/p biopsy left supraclavicular lymph node.  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  FISH studies pending. HOLD off bone marrow biopsy.   # Revised IPI-good risk translates to approximately 80% chance of cure. Discussed that goal of lymphoma treatment is cure.  Plus multiple novel therapies available for patient if patient has progress/recurrence post first-line chemotherapy.  #Discussed R-CHOP chemotherapy every 3 weeks x 6 cycles.   would do interim scan after 2-3 cycles of chemo.  I discussed at length regarding the individual drugs in R-CHOP.   # Discussed the potential side effects including but not limited to-increasing fatigue, nausea vomiting, diarrhea, hair loss, sores in the mouth, increase risk of infection and also neuropathy. Also discussed regarding potential side effects of heart failure/leukemia with Adriamycin.  May 2022- 2D echo-55% ejection fraction discussed that benefits outweigh the risk.  Hepatitis work-up pending.   #Skin rash/hoarseness of voice-likely allergic reaction to unknown medication.  The patient denies hydrocodone as a culprit as he had taken hydrocodone prior to any problems.  Continue prednisone as ordered.   #Start chemotherapy next week. Antiemetics-Zofran and Compazine; EMLA cream sent to pharmacy.   # COVID EVUSHELD PROPHYLAXIS: Given the immunosuppressive effects of treatments I would recommend EVUSHELD prophylaxis.  IM injection x2 [same time]-cutdown risk of Covid  infection/next 6 months. Patient is vaccinated to [place].  Referral to Ms.Causey, [place]-who will speak to the patient.   #Gout/shingles prophylaxis- allopurinol/acyclovir ordered.   # ordered premeds [singulair/ prednisone/Tylenol/Benadryl prior] for  rituxan-written instructions given.  # DISPOSITION: # labs today-  # R-CHOP chemo ASAP; D-3 fulphila- # follow up TBD-Dr.B-dr.B  # 40 minutes face-to-face with the patient discussing the above plan of care; more than 50% of time spent on prognosis/ natural history; counseling and coordination.  All questions were answered. The patient knows to call the clinic with any problems, questions or concerns.    Cammie Sickle, MD 12/26/2020 3:31 PM

## 2020-12-26 NOTE — Assessment & Plan Note (Addendum)
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  FISH studies pending.  Good risk IPI.  #Proceed with R-CHOP chemotherapy today cycle 1. Labs today reviewed;  acceptable for treatment today; hepatitis labs-negative.  2D echo 55% ejection fraction May 2022.  Again discussed regarding the infusion reaction from Rituxan.  Patient s/p premedications.  #Skin rash/hoarseness of voice-likely allergic reaction to unknown medication-s/p prednisone significant improvement noted.  #Start chemotherapy next week. Antiemetics-Zofran and Compazine; EMLA cream sent to pharmacy.  # COVID EVUSHELD PROPHYLAXIS: Given the immunosuppressive effects of treatments I would recommend EVUSHELD prophylaxis.  IM injection x2 [same time]-cutdown risk of Covid infection/next 6 months. Patient is vaccinated to [place].  Awaiting call from [facility] regarding evusheld.   #Gout/shingles prophylaxis- allopurinol/acyclovir -STABLE  # DISPOSITION: # follow up in 3 weeks- MD; Labs- cbc/cmp;LDH;  R-CHOP chemo D-2 fulphila-Dr.B

## 2020-12-26 NOTE — Progress Notes (Signed)
The following Medication: Curtis Manning is approved for drug replacement program by [employer]. The enrollment period is from 12/23/2020 to 12/23/2021.  Reason for Assistance: Self. ID: [id number] First DOS:12/26/2020.   Madalyn Rob, CPhT IV Drug Replacement Specialist  [place] Phone: [phone]

## 2020-12-26 NOTE — Progress Notes (Signed)
The following Medication: Rituxan is approved for drug replacement program by [place]. The enrollment period is from 12/26/20 to 12/26/21.  Reason for Assistance: Self. ID: [id number] First DOS:12/26/2020.   Madalyn Rob, CPhT IV Drug Replacement Specialist  [place] Phone: [phone]

## 2020-12-27 ENCOUNTER — Telehealth: Payer: Self-pay | Admitting: Adult Health

## 2020-12-27 ENCOUNTER — Telehealth: Payer: Self-pay

## 2020-12-27 ENCOUNTER — Inpatient Hospital Stay: Payer: Medicaid Other

## 2020-12-27 DIAGNOSIS — Z5112 Encounter for antineoplastic immunotherapy: Secondary | ICD-10-CM | POA: Diagnosis not present

## 2020-12-27 DIAGNOSIS — C8589 Other specified types of non-Hodgkin lymphoma, extranodal and solid organ sites: Secondary | ICD-10-CM

## 2020-12-27 MED ORDER — PEGFILGRASTIM-JMDB 6 MG/0.6ML ~~LOC~~ SOSY
6.0000 mg | PREFILLED_SYRINGE | Freq: Once | SUBCUTANEOUS | Status: AC
  Administered 2020-12-27: 6 mg via SUBCUTANEOUS
  Filled 2020-12-27: qty 0.6

## 2020-12-27 NOTE — Telephone Encounter (Signed)
I called patient to discuss Evusheld, a long acting monoclonal antibody injection administered to patients with decreased immune systems or intolerance/allergy to the COVID 19 vaccine as COVID19 prevention.    Unable to reach patient.  Unable to leave voice mail, the mailbox was full.   Wilber Bihari, NP

## 2020-12-27 NOTE — Telephone Encounter (Signed)
Telephone call to patient for follow up after receiving first infusion.   Patient wife states infusion went great.  States eating good and drinking plenty of fluids.   Denies any nausea or vomiting.  Encouraged patient to call for any concerns or questions.

## 2020-12-28 ENCOUNTER — Encounter: Payer: Self-pay | Admitting: Internal Medicine

## 2020-12-28 ENCOUNTER — Other Ambulatory Visit: Payer: Self-pay

## 2020-12-28 ENCOUNTER — Other Ambulatory Visit: Payer: Self-pay | Admitting: Anatomic Pathology & Clinical Pathology

## 2020-12-28 LAB — SURGICAL PATHOLOGY

## 2020-12-29 ENCOUNTER — Encounter: Payer: Self-pay | Admitting: *Deleted

## 2021-01-03 LAB — ACID FAST CULTURE WITH REFLEXED SENSITIVITIES (MYCOBACTERIA): Acid Fast Culture: NEGATIVE

## 2021-01-06 ENCOUNTER — Other Ambulatory Visit: Payer: Self-pay

## 2021-01-06 ENCOUNTER — Inpatient Hospital Stay: Payer: Medicaid Other | Attending: Internal Medicine | Admitting: Nurse Practitioner

## 2021-01-06 ENCOUNTER — Encounter: Payer: Self-pay | Admitting: Internal Medicine

## 2021-01-06 DIAGNOSIS — Z87891 Personal history of nicotine dependence: Secondary | ICD-10-CM | POA: Insufficient documentation

## 2021-01-06 DIAGNOSIS — Z5189 Encounter for other specified aftercare: Secondary | ICD-10-CM | POA: Insufficient documentation

## 2021-01-06 DIAGNOSIS — Z8249 Family history of ischemic heart disease and other diseases of the circulatory system: Secondary | ICD-10-CM | POA: Insufficient documentation

## 2021-01-06 DIAGNOSIS — Z800 Family history of malignant neoplasm of digestive organs: Secondary | ICD-10-CM | POA: Insufficient documentation

## 2021-01-06 DIAGNOSIS — Z79899 Other long term (current) drug therapy: Secondary | ICD-10-CM | POA: Insufficient documentation

## 2021-01-06 DIAGNOSIS — Z5112 Encounter for antineoplastic immunotherapy: Secondary | ICD-10-CM | POA: Insufficient documentation

## 2021-01-06 DIAGNOSIS — I4891 Unspecified atrial fibrillation: Secondary | ICD-10-CM | POA: Insufficient documentation

## 2021-01-06 DIAGNOSIS — U071 COVID-19: Secondary | ICD-10-CM

## 2021-01-06 DIAGNOSIS — C8339 Diffuse large B-cell lymphoma, extranodal and solid organ sites: Secondary | ICD-10-CM | POA: Insufficient documentation

## 2021-01-06 DIAGNOSIS — Z5111 Encounter for antineoplastic chemotherapy: Secondary | ICD-10-CM | POA: Insufficient documentation

## 2021-01-06 MED ORDER — NIRMATRELVIR/RITONAVIR (PAXLOVID)TABLET
3.0000 | ORAL_TABLET | Freq: Two times a day (BID) | ORAL | 0 refills | Status: AC
  Filled 2021-01-06: qty 30, 5d supply, fill #0

## 2021-01-06 NOTE — Patient Instructions (Addendum)
You are being prescribed PAXLOVID for COVID-19 infection.   Please pick up your prescription at: [facility]  Address:  [facility], [address], [place], [place] [address]      Hours:  Monday - Friday 7:30 am - 5:30 pm.  Saturday Closed   Sunday Closed  Phone: [phone]   Please call the pharmacy or go through the drive through vs going inside if you are picking up the mediation yourself to prevent further spread. If prescribed to a [facility] affiliated pharmacy, a pharmacist will bring the medication out to your car.   ADMINISTRATION INSTRUCTIONS: Take with or without food. Swallow the tablets whole. Don't chew, crush, or break the medications because it might not work as well  For each dose of the medication, you should be taking 3 tablets together TWICE a day for FIVE days   Finish your full five-day course of Paxlovid even if you feel better before you're done. Stopping this medication too early can make it less effective to prevent severe illness related to [place].    Paxlovid is prescribed for YOU ONLY. Don't share it with others, even if they have similar symptoms as you. This medication might not be right for everyone.  Make sure to take steps to protect yourself and others while you're taking this medication in order to get well soon and to prevent others from getting sick with COVID-19.  Paxlovid (nirmatrelvir / ritonavir) can cause hormonal birth control medications to not work well. If you or your partner is currently taking hormonal birth control, use condoms or other birth control methods to prevent unintended pregnancies.  COMMON SIDE EFFECTS: Altered or bad taste in your mouth  Diarrhea  High blood pressure (1% of people) Muscle aches (1% of people)   If your COVID-19 symptoms get worse, get medical help right away. Call 911 if you experience symptoms such as worsening cough, trouble breathing, chest pain that doesn't go away,  confusion, a hard time staying awake, and pale or blue-colored skin.  This medication won't prevent all COVID-19 cases from getting worse.

## 2021-01-06 NOTE — Progress Notes (Signed)
Virtual Visit Progress Note  Symptom [facility] [place]  Telephone:([phone] [phone]  I connected with Orbie Pyo. on 01/06/21 at  3:30 PM EDT by video enabled telemedicine visit and verified that I am speaking with the correct person using two identifiers.   I discussed the limitations, risks, security and privacy concerns of performing an evaluation and management service by telemedicine and the availability of in-person appointments. I also discussed with the patient that there may be a patient responsible charge related to this service. The patient expressed understanding and agreed to proceed.   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  FISH studies pending. Recommend hold off bone marrow biopsy. PET June 9th 2022-Central left hilar and subcarinal mass with metastatic disease to the left lower lobe and substantial mediastinal and bilateral hilar adenopathy as well as mild lower neck level V adenopathy on the left. Revised IPI-good risk .  # June 27th, 2022- R-CHOP cycle #1.  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  Family members were sick and tested positive.  Patient tested today and tested positive.  He reports hoarseness.  Additionally, fatigued and sore throat.  No cough.  He is unclear if symptoms are related to his lymphoma or his COVID infection.  Has been somewhat worse today. He has not received Evusheld.  Review of systems- Review of Systems  Constitutional:  Positive for malaise/fatigue. Negative for chills, diaphoresis, fever and weight loss.  HENT:  Positive for congestion. Negative for ear discharge, ear pain, hearing loss, nosebleeds, sinus pain, sore throat and tinnitus.   Eyes:  Negative for blurred vision, double vision, pain and redness.  Respiratory:  Negative for cough, hemoptysis, sputum production, shortness of breath, wheezing and stridor.   Cardiovascular:  Negative for chest pain, palpitations and leg swelling.  Gastrointestinal:  Negative for abdominal pain, blood in stool, constipation, diarrhea, melena, nausea and vomiting.  Genitourinary:  Negative for dysuria and urgency.  Musculoskeletal:  Negative for back pain, falls, joint pain and myalgias.  Skin:  Negative for itching and rash.  Neurological:  Negative for dizziness, tingling, sensory change, loss of consciousness, weakness and headaches.  Endo/Heme/Allergies:  Negative for environmental allergies. Does not bruise/bleed easily.  Psychiatric/Behavioral:  Negative for depression. The patient is not nervous/anxious and does not have insomnia.   All other systems reviewed and are  negative.   Current treatment- R-CHOP   No Known Allergies  Past Medical History:  Diagnosis Date   A-fib (HCC)    Asthma    GERD (gastroesophageal reflux disease)    Lung mass    Pleural effusion     Past Surgical  History:  Procedure Laterality Date   IR IMAGING GUIDED PORT INSERTION  12/16/2020    Social History   Socioeconomic History   Marital status: Married    Spouse name: Not on file   Number of children: Not on file   Years of education: Not on file   Highest education level: Not on file  Occupational History   Not on file  Tobacco Use   Smoking status: Former    Packs/day: 0.50    Years: 20.00    Pack years: 10.00    Types: Cigarettes    Quit date: 11/25/2020    Years since quitting: 0.1   Smokeless tobacco: Never  Vaping Use   Vaping Use: Never used  Substance and Sexual Activity   Alcohol use: Yes    Comment: "5 to 6 shots on weekends"   Drug use: Never   Sexual activity: Not on file  Other Topics Concern   Not on file  Social History Narrative   Patient is a active smoker.;  Also liquor.  Lives with his wife in [place].  No children.  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  [to prevent shingles], Disp: 60 tablet, Rfl: 3   albuterol (PROVENTIL) (2.5 MG/3ML) 0.083% nebulizer solution, Inhale 3 mLs into the lungs every 6 (six) hours as needed., Disp: , Rfl:    albuterol (VENTOLIN HFA) 108 (90 Base) MCG/ACT inhaler, Inhale 1-2 puffs into the lungs every 4 (four) hours as needed for wheezing or shortness of breath., Disp: 1 each, Rfl: 2   allopurinol (ZYLOPRIM) 300 MG tablet, Take 1 tablet (300 mg total) by mouth 2 (two) times daily., Disp: 120  tablet, Rfl: 0   chlorpheniramine-HYDROcodone (TUSSIONEX) 10-8 MG/5ML SUER, Take 5 mLs by mouth every 12 (twelve) hours as needed for cough. (Patient not taking: No sig reported), Disp: , Rfl:    cholecalciferol (VITAMIN D) 25 MCG tablet, Take 1 tablet (1,000 Units total) by mouth daily. Can take any over-the-counter., Disp: , Rfl:    diltiazem (CARDIZEM CD) 240 MG 24 hr capsule, Take 1 capsule (240 mg total) by mouth daily., Disp: 30 capsule, Rfl: 0   diphenhydrAMINE (BENADRYL) 25 MG tablet, Take 25 mg by mouth every 6 (six) hours as needed., Disp: , Rfl:    feeding supplement (ENSURE ENLIVE / ENSURE PLUS) LIQD, Take 237 mLs by mouth 3 (three) times daily between meals., Disp: 237 mL, Rfl: 12   HYDROcodone-acetaminophen (NORCO/VICODIN) 5-325 MG tablet, Take 1 tablet by mouth every 6 (six) hours as needed., Disp: , Rfl:    ipratropium-albuterol (DUONEB) 0.5-2.5 (3)  MG/3ML SOLN, Take 3 mLs by nebulization every 6 (six) hours as needed., Disp: 75 mL, Rfl: 2   lidocaine-prilocaine (EMLA) cream, Apply 30 -45 mins prior to port access., Disp: 30 g, Rfl: 0   metoprolol tartrate (LOPRESSOR) 50 MG tablet, Take 1 tablet (50 mg total) by mouth every 6 (six) hours., Disp: 120 tablet, Rfl: 0   montelukast (SINGULAIR) 10 MG tablet, Start 2 days prior to infusion as direction by your provider, Disp: 30 tablet, Rfl: 0   Multiple Vitamin (MULTIVITAMIN WITH MINERALS) TABS tablet, Take 1 tablet by mouth daily., Disp: , Rfl:    omeprazole (PRILOSEC) 10 MG capsule, Take 20 mg by mouth daily., Disp: , Rfl:    ondansetron (ZOFRAN) 4 MG tablet, Take 2 tablets (8 mg) by mouth every 8 hours as needed for nausea or vomiting, Disp: 80 tablet, Rfl: 1   predniSONE (DELTASONE) 20 MG tablet, Take 5 tablets (100 mg) by mouth daily for 5 days. Start on the day chemo starts., Disp: 25 tablet, Rfl: 5   prochlorperazine (COMPAZINE) 10 MG tablet, Take 1 tablet (10 mg total) by mouth every 6 (six) hours as needed for nausea or vomiting.,  Disp: 40 tablet, Rfl: 1  Physical exam: Exam limited due to telemedicine  There were no vitals filed for this visit. Physical Exam Constitutional:      Appearance: He is not ill-appearing.  Pulmonary:     Comments: hoarse Neurological:     Mental Status: He is alert.  Psychiatric:        Mood and Affect: Mood normal.        Behavior: Behavior normal.     CMP Latest Ref Rng & Units 12/22/2020  Glucose 70 - 99 mg/dL 109(H)  BUN 6 - 20 mg/dL 14  Creatinine 0.61 - 1.24 mg/dL 0.59(L)  Sodium 135 - 145 mmol/L 139  Potassium 3.5 - 5.1 mmol/L 3.5  Chloride 98 - 111 mmol/L 104  CO2 22 - 32 mmol/L 29  Calcium 8.9 - 10.3 mg/dL 9.3  Total Protein 6.5 - 8.1 g/dL 7.1  Total Bilirubin 0.3 - 1.2 mg/dL 0.5  Alkaline Phos 38 - 126 U/L 70  AST 15 - 41 U/L 24  ALT 0 - 44 U/L 24   CBC Latest Ref Rng & Units 12/22/2020  WBC 4.0 - 10.5 K/uL 11.1(H)  Hemoglobin 13.0 - 17.0 g/dL 12.2(L)  Hematocrit 39.0 - 52.0 % 36.4(L)  Platelets 150 - 400 K/uL 240    No images are attached to the encounter.  MR Brain W Wo Contrast  Result Date: 12/16/2020 CLINICAL DATA:  Lung mass.  Evaluation for metastatic disease. EXAM: MRI HEAD WITHOUT AND WITH CONTRAST TECHNIQUE: Multiplanar, multiecho pulse sequences of the brain and surrounding structures were obtained without and with intravenous contrast. CONTRAST:  40m GADAVIST GADOBUTROL 1 MMOL/ML IV SOLN COMPARISON:  None. FINDINGS: Brain: There is no evidence of an acute infarct, intracranial hemorrhage, mass, midline shift, or extra-axial fluid collection. The ventricles and sulci are normal. The brain is normal in signal. No abnormal brain parenchymal or meningeal enhancement is identified. A small developmental venous anomaly is incidentally noted laterally in the right cerebellar hemisphere. Vascular: Major intracranial vascular flow voids are preserved. Skull and upper cervical spine: Unremarkable bone marrow signal. Sinuses/Orbits: Unremarkable orbits. Minimal  mucosal thickening in the left maxillary sinus. Trace bilateral mastoid fluid. Other: Small right frontal scalp lipoma. IMPRESSION: Unremarkable appearance of the brain. No evidence of intracranial metastases. Electronically Signed   By: AZenia Resides  Jeralyn Ruths M.D.   On: 12/16/2020 12:06   NM PET Image Initial (PI) Skull Base To Thigh  Result Date: 12/09/2020 CLINICAL DATA:  Initial treatment strategy for non-small cell lung cancer. EXAM: NUCLEAR MEDICINE PET SKULL BASE TO THIGH TECHNIQUE: 7.7 mCi F-18 FDG was injected intravenously. Full-ring PET imaging was performed from the skull base to thigh after the radiotracer. CT data was obtained and used for attenuation correction and anatomic localization. Fasting blood glucose: 121 mg/dl COMPARISON:  CT chest 11/25/2020 FINDINGS: Mediastinal blood pool activity: SUV max 1.4 Liver activity: SUV max NA NECK: A 0.7 cm left level V lymph node on image 60 of series 3 has a maximum SUV of 6.0. Additional lymph nodes straddle between level IV and the supraclavicular region, and will be counted as supraclavicular nodes below. Incidental CT findings: none CHEST: Hypermetabolic supraclavicular, paratracheal, prevascular, AP window, left paratracheal, left paraspinal, bilateral hilar, left infrahilar, lower paraesophageal, and subcarinal adenopathy noted. Right supraclavicular node 1.4 cm in short axis on image 66 series 3 with maximum SUV 6.7. Anterior AP window lymph node 2.8 cm in short axis on image 100 series 3, maximum SUV 9.0. Index right lower paratracheal lymph node 2.0 cm in short axis on image 99 series 3, maximum SUV 8.9. Subcarinal mass noted involving the left hilum/tracheobronchial tree and measuring about 10.1 by 6.0 cm with maximum SUV 8.4. Left lower lobe mass 8.8 by 4.6 cm on image 113 series 3, centrally photopenic but high activity along the margins with maximum SUV up to 4.4. Consolidation/drowned lung appearance in a substantial portion of the left upper lobe  posteriorly with only very low-grade metabolic activity, maximum SUV 1.4. Anteriorly in the left lower lobe adjacent to the major fissure, a 2.9 by 1.9 cm nodule on image 131 series 3 is observed with maximum SUV 2.3. There has been some interval clearing of some of the consolidation anteriorly in the left upper lobe, with some mild residual nodularity in the left upper lobe which for the most part is without significant hypermetabolic activity. A bandlike region of volume loss in the lingula is present with maximum SUV of about 1.6. Some of the ground-glass density in the left lower lobe has intervally cleared. Small left pleural effusion without specific indicators of malignant pleural effusion. Incidental CT findings: none ABDOMEN/PELVIS: No significant abnormal hypermetabolic activity in this region. Incidental CT findings: Contracted gallbladder with questionable small gallstones. Aortoiliac atherosclerotic vascular disease. SKELETON: No significant abnormal hypermetabolic activity in this region. Incidental CT findings: Degenerative disc disease at L5-S1. IMPRESSION: 1. Central left hilar and subcarinal mass with metastatic disease to the left lower lobe and substantial mediastinal and bilateral hilar adenopathy as well as mild lower neck level V adenopathy on the left. Appearance favors T4 N3 M0 disease (assuming the left pleural effusion is not malignant) compatible with stage III C non-small cell lung cancer. 2. Improved appearance of consolidation in the left upper lobe. 3. Small left pleural effusion. 4. Suspected cholelithiasis. 5.  Aortic Atherosclerosis (ICD10-I70.0). Electronically Signed   By: Van Clines M.D.   On: 12/09/2020 09:10   [place] CORE BIOPSY (LYMPH NODES)  Result Date: 12/16/2020 INDICATION: Non-small cell lung cancer.  Multifocal lymphadenopathy. EXAM: ULTRASOUND GUIDED CORE NEEDLE BIOPSY OF ENLARGED LEFT NECK LYMPH NODE MEDICATIONS: None. ANESTHESIA/SEDATION: None PROCEDURE: The  procedure, risks, benefits, and alternatives were explained to the patient. Questions regarding the procedure were encouraged and answered. The patient understands and consents to the procedure. The left neck skin was prepped  with chlorhexidine in a sterile fashion, and a sterile drape was applied covering the operative field. A sterile gown and sterile gloves were used for the procedure. Local anesthesia was provided with 1% Lidocaine. Following local lidocaine administration, four 18 gauge cores were obtained from the enlarged left supraclavicular lymph node utilizing continuous ultrasound guidance. Samples were sent to pathology in sterile saline. Needle removed and hemostasis achieved with 2 minutes of manual compression. Post procedure ultrasound images showed no evidence of significant hemorrhage. COMPLICATIONS: None immediate. IMPRESSION: Ultrasound-guided core needle biopsy of enlarged left neck lymph node. Electronically Signed   By: Miachel Roux M.D.   On: 12/16/2020 15:43   IR IMAGING GUIDED PORT INSERTION  Result Date: 12/16/2020 INDICATION: Non-small cell lung cancer EXAM: IMPLANTED PORT A CATH PLACEMENT WITH ULTRASOUND AND FLUOROSCOPIC GUIDANCE MEDICATIONS: None ANESTHESIA/SEDATION: Moderate (conscious) sedation was employed during this procedure. A total of Versed 1 mg and Fentanyl 50 mcg was administered intravenously. Moderate Sedation Time: 18 minutes. The patient's level of consciousness and vital signs were monitored continuously by radiology nursing throughout the procedure under my direct supervision. FLUOROSCOPY TIME:  0.6 minutes (6.14 mGy) COMPLICATIONS: None immediate. PROCEDURE: The procedure, risks, benefits, and alternatives were explained to the patient. Questions regarding the procedure were encouraged and answered. The patient understands and consents to the procedure. A timeout was performed prior to the initiation of the procedure. Patient positioned supine on the angiography  table. Right neck and anterior upper chest prepped and draped in the usual sterile fashion. All elements of maximal sterile barrier were utilized including, cap, mask, sterile gown, sterile gloves, large sterile drape, hand scrubbing and 2% Chlorhexidine for skin cleaning. The right internal jugular vein was evaluated with ultrasound and shown to be patent. A permanent ultrasound image was obtained and placed in the patient's medical record. Local anesthesia was provided with 1% lidocaine with epinephrine. Using sterile gel and a sterile probe cover, the right internal jugular vein was entered with a 21 ga needle during real time ultrasound guidance. 0.018 inch guidewire placed and 21 ga needle exchanged for transitional dilator set. Utilizing fluoroscopy, 0.035 inch guidewire advanced through the needle without difficulty. Attention then turned to the right anterior upper chest. Following local lidocaine administration, a port pocket was created. The catheter was connected to the port and brought from the pocket to the venotomy site through a subcutaneous tunnel. The catheter was cut to size and inserted through the peel-away sheath. The catheter tip was positioned at the cavoatrial junction using fluoroscopic guidance. The port aspirated and flushed well. The port pocket was closed with deep and superficial absorbable suture. The port pocket incision and venotomy sites were also sealed with Dermabond. IMPRESSION: Successful placement of a right internal jugular approach power injectable Port-A-Cath. The catheter is ready for immediate use. Electronically Signed   By: Miachel Roux M.D.   On: 12/16/2020 15:11    Assessment and plan- Patient is a 50 y.o. male    1) Covid-19 Virus Infection - I advised patient to stay well hydrated, particularly in the event of sustained or high fevers, in whom insensible fluid losses may be significant  - Cough that is persistent, interferes with sleep, or causes discomfort can  be managed with an over-the-counter cough medication (eg, dextromethorphan) or prescription benzonatate, 100 to 200 mg orally three times daily as needed - I recommend rest as needed during the acute illness and frequent repositioning ambulation is encouraged. In addition, we encourage all patients to advance activity  as soon as tolerated during recovery. - take mucinex to thin secretions - take tylenol and/or ibuprofen per label as needed for pain/fever/ body aches. Do not exceed 3000 mg in 24 hour period of tylenol - Reviewed symptoms that would warrant ER or hospital care - encouraged patient to monitor oxygen levels with pulse oximeter, reviewed troubleshooting, and to seek ER for SpO2 < 94% - CDC criteria for quarantine and isolation reviewed - Prescription for paxlovid sent to pharmacy today. Medication review performed. Sent to [facility]. GFR > 60.   Dr. Aletha Halim team coordinating follow up care with Dr. Rogue Bussing  Visit Diagnosis 1. COVID-19 virus infection     Patient expressed understanding and was in agreement with this plan. He also understands that He can call clinic at any time with any questions, concerns, or complaints.   I discussed the assessment and treatment plan with the patient. The patient was provided an opportunity to ask questions and all were answered. The patient agreed with the plan and demonstrated an understanding of the instructions.   The patient was advised to call back or seek an in-person evaluation if the symptoms worsen or if the condition fails to improve as anticipated.   I spent 15 minutes face-to-face video visit time dedicated to the care of this patient on the date of this encounter to include pre-visit review of labs, medical oncology notes, face-to-face time with the patient, and post visit ordering of testing/documentation.   Thank you for allowing me to participate in the care of this very pleasant patient.   Beckey Rutter,  DNP, AGNP-[facility]

## 2021-01-12 ENCOUNTER — Other Ambulatory Visit: Payer: Self-pay

## 2021-01-13 ENCOUNTER — Encounter: Payer: Self-pay | Admitting: Internal Medicine

## 2021-01-13 ENCOUNTER — Other Ambulatory Visit: Payer: Self-pay

## 2021-01-13 MED ORDER — PREDNISONE 10 MG PO TABS
10.0000 mg | ORAL_TABLET | ORAL | 0 refills | Status: DC
  Filled 2021-01-13: qty 21, 6d supply, fill #0

## 2021-01-13 MED ORDER — DOXYCYCLINE HYCLATE 100 MG PO TABS
ORAL_TABLET | ORAL | 0 refills | Status: DC
  Filled 2021-01-13: qty 20, 10d supply, fill #0

## 2021-01-13 MED ORDER — ALBUTEROL SULFATE HFA 108 (90 BASE) MCG/ACT IN AERS
INHALATION_SPRAY | RESPIRATORY_TRACT | 0 refills | Status: DC
  Filled 2021-01-13: qty 6.7, 25d supply, fill #0

## 2021-01-16 ENCOUNTER — Inpatient Hospital Stay: Payer: Medicaid Other

## 2021-01-16 ENCOUNTER — Other Ambulatory Visit: Payer: Self-pay

## 2021-01-16 ENCOUNTER — Encounter: Payer: Self-pay | Admitting: Internal Medicine

## 2021-01-16 ENCOUNTER — Inpatient Hospital Stay (HOSPITAL_BASED_OUTPATIENT_CLINIC_OR_DEPARTMENT_OTHER): Payer: Medicaid Other | Admitting: Internal Medicine

## 2021-01-16 VITALS — BP 141/86 | HR 103 | Resp 20

## 2021-01-16 DIAGNOSIS — Z5112 Encounter for antineoplastic immunotherapy: Secondary | ICD-10-CM | POA: Diagnosis present

## 2021-01-16 DIAGNOSIS — C8339 Diffuse large B-cell lymphoma, extranodal and solid organ sites: Secondary | ICD-10-CM | POA: Diagnosis present

## 2021-01-16 DIAGNOSIS — Z5189 Encounter for other specified aftercare: Secondary | ICD-10-CM | POA: Diagnosis not present

## 2021-01-16 DIAGNOSIS — C8589 Other specified types of non-Hodgkin lymphoma, extranodal and solid organ sites: Secondary | ICD-10-CM

## 2021-01-16 DIAGNOSIS — Z8249 Family history of ischemic heart disease and other diseases of the circulatory system: Secondary | ICD-10-CM | POA: Diagnosis not present

## 2021-01-16 DIAGNOSIS — Z79899 Other long term (current) drug therapy: Secondary | ICD-10-CM | POA: Diagnosis not present

## 2021-01-16 DIAGNOSIS — Z87891 Personal history of nicotine dependence: Secondary | ICD-10-CM | POA: Diagnosis not present

## 2021-01-16 DIAGNOSIS — Z5111 Encounter for antineoplastic chemotherapy: Secondary | ICD-10-CM | POA: Diagnosis present

## 2021-01-16 DIAGNOSIS — I4891 Unspecified atrial fibrillation: Secondary | ICD-10-CM | POA: Diagnosis not present

## 2021-01-16 DIAGNOSIS — Z800 Family history of malignant neoplasm of digestive organs: Secondary | ICD-10-CM | POA: Diagnosis not present

## 2021-01-16 LAB — CBC WITH DIFFERENTIAL/PLATELET
Abs Immature Granulocytes: 0.48 10*3/uL — ABNORMAL HIGH (ref 0.00–0.07)
Basophils Absolute: 0.1 10*3/uL (ref 0.0–0.1)
Basophils Relative: 1 %
Eosinophils Absolute: 0 10*3/uL (ref 0.0–0.5)
Eosinophils Relative: 0 %
HCT: 32.5 % — ABNORMAL LOW (ref 39.0–52.0)
Hemoglobin: 10.7 g/dL — ABNORMAL LOW (ref 13.0–17.0)
Immature Granulocytes: 3 %
Lymphocytes Relative: 13 %
Lymphs Abs: 2.3 10*3/uL (ref 0.7–4.0)
MCH: 30.1 pg (ref 26.0–34.0)
MCHC: 32.9 g/dL (ref 30.0–36.0)
MCV: 91.3 fL (ref 80.0–100.0)
Monocytes Absolute: 1.7 10*3/uL — ABNORMAL HIGH (ref 0.1–1.0)
Monocytes Relative: 10 %
Neutro Abs: 13.3 10*3/uL — ABNORMAL HIGH (ref 1.7–7.7)
Neutrophils Relative %: 73 %
Platelets: 357 10*3/uL (ref 150–400)
RBC: 3.56 MIL/uL — ABNORMAL LOW (ref 4.22–5.81)
RDW: 16.5 % — ABNORMAL HIGH (ref 11.5–15.5)
WBC: 17.8 10*3/uL — ABNORMAL HIGH (ref 4.0–10.5)
nRBC: 0 % (ref 0.0–0.2)

## 2021-01-16 LAB — COMPREHENSIVE METABOLIC PANEL
ALT: 16 U/L (ref 0–44)
AST: 28 U/L (ref 15–41)
Albumin: 3.4 g/dL — ABNORMAL LOW (ref 3.5–5.0)
Alkaline Phosphatase: 77 U/L (ref 38–126)
Anion gap: 11 (ref 5–15)
BUN: 11 mg/dL (ref 6–20)
CO2: 24 mmol/L (ref 22–32)
Calcium: 9.2 mg/dL (ref 8.9–10.3)
Chloride: 102 mmol/L (ref 98–111)
Creatinine, Ser: 0.64 mg/dL (ref 0.61–1.24)
GFR, Estimated: >60 mL/min (ref >60)
Glucose, Bld: 104 mg/dL — ABNORMAL HIGH (ref 70–99)
Potassium: 3.7 mmol/L (ref 3.5–5.1)
Sodium: 137 mmol/L (ref 135–145)
Total Bilirubin: 0.4 mg/dL (ref 0.3–1.2)
Total Protein: 6.3 g/dL — ABNORMAL LOW (ref 6.5–8.1)

## 2021-01-16 MED ORDER — SODIUM CHLORIDE 0.9 % IV SOLN: 375.0000 mg/m2 | Freq: Once | INTRAVENOUS | Status: DC

## 2021-01-16 MED ORDER — ACETAMINOPHEN 325 MG PO TABS
650.0000 mg | ORAL_TABLET | Freq: Once | ORAL | Status: AC
  Administered 2021-01-16: 650 mg via ORAL
  Filled 2021-01-16: qty 2

## 2021-01-16 MED ORDER — FAMOTIDINE 20 MG IN NS 100 ML IVPB
20.0000 mg | Freq: Once | INTRAVENOUS | Status: AC
  Administered 2021-01-16: 20 mg via INTRAVENOUS
  Filled 2021-01-16: qty 20

## 2021-01-16 MED ORDER — SODIUM CHLORIDE 0.9 % IV SOLN
750.0000 mg/m2 | Freq: Once | INTRAVENOUS | Status: AC
  Administered 2021-01-16: 1360 mg via INTRAVENOUS
  Filled 2021-01-16: qty 50

## 2021-01-16 MED ORDER — HEPARIN SOD (PORK) LOCK FLUSH 100 UNIT/ML IV SOLN
500.0000 [IU] | Freq: Once | INTRAVENOUS | Status: AC | PRN
  Administered 2021-01-16: 500 [IU]
  Filled 2021-01-16: qty 5

## 2021-01-16 MED ORDER — SODIUM CHLORIDE 0.9 % IV SOLN
150.0000 mg | Freq: Once | INTRAVENOUS | Status: AC
  Administered 2021-01-16: 150 mg via INTRAVENOUS
  Filled 2021-01-16: qty 150

## 2021-01-16 MED ORDER — DIPHENHYDRAMINE HCL 25 MG PO CAPS
50.0000 mg | ORAL_CAPSULE | Freq: Once | ORAL | Status: AC
  Administered 2021-01-16: 50 mg via ORAL
  Filled 2021-01-16: qty 2

## 2021-01-16 MED ORDER — PALONOSETRON HCL INJECTION 0.25 MG/5ML
0.2500 mg | Freq: Once | INTRAVENOUS | Status: AC
  Administered 2021-01-16: 0.25 mg via INTRAVENOUS
  Filled 2021-01-16: qty 5

## 2021-01-16 MED ORDER — MONTELUKAST SODIUM 10 MG PO TABS
10.0000 mg | ORAL_TABLET | Freq: Once | ORAL | Status: AC
  Administered 2021-01-16: 10 mg via ORAL
  Filled 2021-01-16: qty 1

## 2021-01-16 MED ORDER — VINCRISTINE SULFATE CHEMO INJECTION 1 MG/ML
2.0000 mg | Freq: Once | INTRAVENOUS | Status: AC
  Administered 2021-01-16: 2 mg via INTRAVENOUS
  Filled 2021-01-16: qty 2

## 2021-01-16 MED ORDER — SODIUM CHLORIDE 0.9 % IV SOLN
375.0000 mg/m2 | Freq: Once | INTRAVENOUS | Status: AC
  Administered 2021-01-16: 700 mg via INTRAVENOUS
  Filled 2021-01-16: qty 50

## 2021-01-16 MED ORDER — SODIUM CHLORIDE 0.9 % IV SOLN
Freq: Once | INTRAVENOUS | Status: AC
  Filled 2021-01-16: qty 250

## 2021-01-16 MED ORDER — HEPARIN SOD (PORK) LOCK FLUSH 100 UNIT/ML IV SOLN
500.0000 [IU] | Freq: Once | INTRAVENOUS | Status: AC
Start: 2021-01-16 — End: 2021-01-16
  Filled 2021-01-16: qty 5

## 2021-01-16 MED ORDER — MONTELUKAST SODIUM 10 MG PO TABS
10.0000 mg | ORAL_TABLET | Freq: Every day | ORAL | Status: DC
  Filled 2021-01-16: qty 1

## 2021-01-16 MED ORDER — SODIUM CHLORIDE 0.9 % IV SOLN
375.0000 mg/m2 | Freq: Once | INTRAVENOUS | Status: DC
  Filled 2021-01-16: qty 70

## 2021-01-16 MED ORDER — SODIUM CHLORIDE 0.9 % IV SOLN
10.0000 mg | Freq: Once | INTRAVENOUS | Status: AC
  Administered 2021-01-16: 10 mg via INTRAVENOUS
  Filled 2021-01-16: qty 10

## 2021-01-16 MED ORDER — DOXORUBICIN HCL CHEMO IV INJECTION 2 MG/ML
50.0000 mg/m2 | Freq: Once | INTRAVENOUS | Status: AC
  Administered 2021-01-16: 90 mg via INTRAVENOUS
  Filled 2021-01-16: qty 45

## 2021-01-16 MED ORDER — SODIUM CHLORIDE 0.9% FLUSH
10.0000 mL | INTRAVENOUS | Status: DC | PRN
  Administered 2021-01-16: 10 mL via INTRAVENOUS
  Filled 2021-01-16: qty 10

## 2021-01-16 NOTE — Patient Instructions (Signed)
[facility] ONCOLOGY  Discharge Instructions: Thank you for choosing [facility] to provide your oncology and hematology care.  If you have a lab appointment with the [facility], please go directly to the [facility] and check in at the registration area.  Wear comfortable clothing and clothing appropriate for easy access to any Portacath or PICC line.   We strive to give you quality time with your provider. You may need to reschedule your appointment if you arrive late (15 or more minutes).  Arriving late affects you and other patients whose appointments are after yours.  Also, if you miss three or more appointments without notifying the office, you may be dismissed from the clinic at the provider's discretion.      For prescription refill requests, have your pharmacy contact our office and allow 72 hours for refills to be completed.    Today you received the following chemotherapy and/or immunotherapy agents Adriamycin, Vincristine, Cytoxan, Rituxan   To help prevent nausea and vomiting after your treatment, we encourage you to take your nausea medication as directed.  BELOW ARE SYMPTOMS THAT SHOULD BE REPORTED IMMEDIATELY: *FEVER GREATER THAN 100.4 F (38 C) OR HIGHER *CHILLS OR SWEATING *NAUSEA AND VOMITING THAT IS NOT CONTROLLED WITH YOUR NAUSEA MEDICATION *UNUSUAL SHORTNESS OF BREATH *UNUSUAL BRUISING OR BLEEDING *URINARY PROBLEMS (pain or burning when urinating, or frequent urination) *BOWEL PROBLEMS (unusual diarrhea, constipation, pain near the anus) TENDERNESS IN MOUTH AND THROAT WITH OR WITHOUT PRESENCE OF ULCERS (sore throat, sores in mouth, or a toothache) UNUSUAL RASH, SWELLING OR PAIN  UNUSUAL VAGINAL DISCHARGE OR ITCHING   Items with * indicate a potential emergency and should be followed up as soon as possible or go to the Emergency Department if any problems should occur.  Please show the CHEMOTHERAPY ALERT CARD or  IMMUNOTHERAPY ALERT CARD at check-in to the Emergency Department and triage nurse.  Should you have questions after your visit or need to cancel or reschedule your appointment, please contact [facility]  [phone] and follow the prompts.  Office hours are 8:00 a.m. to 4:30 p.m. Monday - Friday. Please note that voicemails left after 4:00 p.m. may not be returned until the following business day.  We are closed weekends and major holidays. You have access to a nurse at all times for urgent questions. Please call the main number to the clinic [phone] and follow the prompts.  For any non-urgent questions, you may also contact your provider using MyChart. We now offer e-Visits for anyone 28 and older to request care online for non-urgent symptoms. For details visit [email or web address].   Also download the MyChart app! Go to the app store, search "MyChart", open the app, select [facility], and log in with your MyChart username and password.  Due to Covid, a mask is required upon entering the hospital/clinic. If you do not have a mask, one will be given to you upon arrival. For doctor visits, patients may have 1 support person aged 27 or older with them. For treatment visits, patients cannot have anyone with them due to current Covid guidelines and our immunocompromised population.

## 2021-01-16 NOTE — Progress Notes (Signed)
[facility] NOTE  Patient Care Team: System, Provider Not In as PCP - General [name], RN as Oncology Nurse Navigator  CHIEF COMPLAINTS/PURPOSE OF CONSULTATION: lymphoma   #  Oncology History Overview Note  #June 2022-Large cell lymphoma-s/p biopsy left supraclavicular lymph node.  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  FISH studies pending. Recommend hold off bone marrow biopsy. PET June 9th 2022-Central left hilar and subcarinal mass with metastatic disease to the left lower lobe and substantial mediastinal and bilateral hilar adenopathy as well as mild lower neck level V adenopathy on the left. Revised IPI-good risk .  # FISH PANEL: BCL6 gene arrangement; negative FISH-Bcl-2/MCY; however triple expresser.   # June 27th, 2022- R-CHOP cycle #1.  # SURVIVORSHIP:   # GENETICS:   DIAGNOSIS:   STAGE:         ;  GOALS:  CURRENT/MOST RECENT THERAPY :     Large cell lymphoma of extranodal and solid organ sites [facility])  12/22/2020 Initial Diagnosis   Large cell lymphoma of extranodal and solid organ sites [facility])    12/26/2020 -  Chemotherapy    Patient is on Treatment Plan: NON-HODGKINS LYMPHOMA R-CHOP Q21D          HISTORY OF PRESENTING ILLNESS:  Curtis Manning. 50 y.o.  male patient stage IV diffuse large B-cell lymphoma is here to proceed with chemotherapy cycle #2 today.  Patient complains of continued hoarseness of voice.  Denies any nausea vomiting.  Appetite is fair.  No chest pain no cough.  Review of Systems  Constitutional:  Positive for malaise/fatigue and weight loss. Negative for chills, diaphoresis and fever.  HENT:  Positive for sore throat. Negative for nosebleeds.   Eyes:  Negative for double vision.  Respiratory:  Positive for shortness of breath. Negative for cough, hemoptysis, sputum production and wheezing.   Cardiovascular:  Negative for chest pain, palpitations, orthopnea and leg swelling.   Gastrointestinal:  Negative for abdominal pain, blood in stool, constipation, diarrhea, heartburn, melena, nausea and vomiting.  Genitourinary:  Negative for dysuria, frequency and urgency.  Musculoskeletal:  Negative for back pain and joint pain.  Skin: Negative.  Negative for itching and rash.  Neurological:  Negative for dizziness, tingling, focal weakness, weakness and headaches.  Endo/Heme/Allergies:  Does not bruise/bleed easily.  Psychiatric/Behavioral:  Negative for depression. The patient is not nervous/anxious and does not have insomnia.     MEDICAL HISTORY:  Past Medical History:  Diagnosis Date   A-fib (HCC)    Asthma    GERD (gastroesophageal reflux disease)    History of COVID-19    Lung mass    Pleural effusion     SURGICAL HISTORY: Past Surgical History:  Procedure Laterality Date   IR IMAGING GUIDED PORT INSERTION  12/16/2020    SOCIAL HISTORY: Social History   Socioeconomic History   Marital status: Married    Spouse name: Not on file   Number of children: Not on file   Years of education: Not on file   Highest education level: Not on file  Occupational History   Not on file  Tobacco Use   Smoking status: Former    Packs/day: 0.50    Years: 20.00    Pack years: 10.00    Types: Cigarettes    Quit date: 11/25/2020    Years since quitting: 0.1   Smokeless tobacco: Never  Vaping Use   Vaping Use: Never used  Substance and Sexual Activity  Alcohol use: Yes    Comment: "5 to 6 shots on weekends"   Drug use: Never   Sexual activity: Not on file  Other Topics Concern   Not on file  Social History Narrative   Patient is a active smoker.;  Also liquor.  Lives with his wife in [place].  No children.  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  MEDICATIONS:  Current Outpatient Medications  Medication Sig Dispense Refill   acyclovir (ZOVIRAX) 400 MG tablet Take 1 tablet (400 mg total) by mouth 2 (two) times daily.  [to prevent shingles] 60 tablet 3   albuterol (PROVENTIL) (2.5 MG/3ML) 0.083% nebulizer solution Inhale 3 mLs into the lungs every 6 (six) hours as needed.     albuterol (VENTOLIN HFA) 108 (90 Base) MCG/ACT inhaler Inhale 1-2 puffs into the lungs every 4 (four) hours as needed for wheezing or shortness of breath. 1 each 2   albuterol (VENTOLIN HFA) 108 (90 Base) MCG/ACT inhaler Inhale 2 inhalations into the lungs once every 6 (six) hours as needed. 6.7 g 0   allopurinol (ZYLOPRIM) 300 MG tablet Take 1 tablet (300 mg total) by mouth 2 (two) times daily. 120 tablet 0   cholecalciferol (VITAMIN D) 25 MCG tablet Take 1 tablet (1,000 Units total) by mouth daily. Can take any over-the-counter.     diltiazem (CARDIZEM CD) 240 MG 24 hr capsule Take 1 capsule (240 mg total) by mouth daily. 30 capsule 0   diphenhydrAMINE (BENADRYL) 25 MG tablet Take 25 mg by mouth every 6 (six) hours as needed.     doxycycline (VIBRA-TABS) 100 MG tablet Take 1 tablet by mouth twice daily for 10 days 20 tablet 0   feeding supplement (ENSURE ENLIVE / ENSURE PLUS) LIQD Take 237 mLs by mouth 3 (three) times daily between meals. 237 mL 12   HYDROcodone-acetaminophen (NORCO/VICODIN) 5-325 MG tablet Take 1 tablet by mouth every 6 (six) hours as needed.     ipratropium-albuterol (DUONEB) 0.5-2.5 (3) MG/3ML SOLN Take 3 mLs by nebulization every 6 (six) hours as needed. 75 mL 2   lidocaine-prilocaine (EMLA) cream Apply 30 -45 mins prior to port access. 30 g 0   metoprolol tartrate (LOPRESSOR) 50 MG tablet Take 1 tablet  (50 mg total) by mouth every 6 (six) hours. 120 tablet 0   Multiple Vitamin (MULTIVITAMIN WITH MINERALS) TABS tablet Take 1 tablet by mouth daily.     omeprazole (PRILOSEC) 10 MG capsule Take 20 mg by mouth daily.     ondansetron (ZOFRAN) 4 MG tablet Take 2 tablets (8 mg) by mouth every 8 hours as needed for nausea or vomiting 80 tablet 1   predniSONE (DELTASONE) 10 MG tablet Take 6 tablets by mouth once daily on day 1 with food.  Then decrease by 1 tablet each day until gone. 21 tablet 0   predniSONE (DELTASONE) 20 MG tablet Take 5 tablets (100 mg) by mouth daily for 5 days. Start on the day chemo  starts. 25 tablet 5   prochlorperazine (COMPAZINE) 10 MG tablet Take 1 tablet (10 mg total) by mouth every 6 (six) hours as needed for nausea or vomiting. 40 tablet 1   chlorpheniramine-HYDROcodone (TUSSIONEX) 10-8 MG/5ML SUER Take 5 mLs by mouth every 12 (twelve) hours as needed for cough. 140 mL 0   montelukast (SINGULAIR) 10 MG tablet Start 2 days prior to infusion as direction by your provider 30 tablet 2   No current facility-administered medications for this visit.      Marland Kitchen  PHYSICAL EXAMINATION: ECOG PERFORMANCE STATUS: 1 - Symptomatic but completely ambulatory  Vitals:   01/16/21 0856  BP: 131/89  Pulse: (!) 109  Resp: (!) 22  Temp: 98.9 F (37.2 C)  SpO2: 97%    Filed Weights   01/16/21 0856  Weight: (P) 145 lb (65.8 kg)     Physical Exam Vitals and nursing note reviewed.  Constitutional:      Comments: Ambulating: Independently; alone.  HENT:     Head: Normocephalic and atraumatic.     Mouth/Throat:     Pharynx: Oropharynx is clear.  Eyes:     Extraocular Movements: Extraocular movements intact.     Pupils: Pupils are equal, round, and reactive to light.  Cardiovascular:     Rate and Rhythm: Normal rate and regular rhythm.  Pulmonary:     Comments: Decreased breath sounds bilaterally left more than right.  Abdominal:     Palpations: Abdomen is soft.   Musculoskeletal:        General: Normal range of motion.     Cervical back: Normal range of motion.  Skin:    General: Skin is warm.  Neurological:     General: No focal deficit present.     Mental Status: He is alert and oriented to person, place, and time.  Psychiatric:        Behavior: Behavior normal.        Judgment: Judgment normal.     LABORATORY DATA:  I have reviewed the data as listed Lab Results  Component Value Date   WBC 17.8 (H) 01/16/2021   HGB 10.7 (L) 01/16/2021   HCT 32.5 (L) 01/16/2021   MCV 91.3 01/16/2021   PLT 357 01/16/2021   Recent Labs    12/13/20 1216 12/22/20 1549 01/16/21 0829  NA 134* 139 137  K 4.3 3.5 3.7  CL 100 104 102  CO2 26 29 24   GLUCOSE 97 109* 104*  BUN 22* 14 11  CREATININE 0.63 0.59* 0.64  CALCIUM 8.5* 9.3 9.2  GFRNONAA >60 >60 >60  PROT 6.1* 7.1 6.3*  ALBUMIN 3.3* 3.4* 3.4*  AST 22 24 28   ALT 22 24 16   ALKPHOS 70 70 77  BILITOT 0.8 0.5 0.4    RADIOGRAPHIC STUDIES: I have personally reviewed the radiological images as listed and agreed with the findings in the report. No results found.  ASSESSMENT & PLAN:   Large cell lymphoma of extranodal and solid organ sites [facility]) #Large cell lymphoma-s/p biopsy left supraclavicular lymph node.  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  FISH-BCL6 gene rearrangement; triple expresser.  Good risk IPI.  #Proceed with R-CHOP chemotherapy today cycle 2. Labs today reviewed;  acceptable for treatment today.   2D echo 55% ejection fraction May 2022.    # hoarseness of voice/likely secondary to mediastinal lymphadenopathy/vocal cord palsy. Refer to ENT ASAP   # COVID EVUSHELD PROPHYLAXIS: Given the immunosuppressive effects of treatments I would recommend EVUSHELD prophylaxis.  IM injection x2 [same time]-cutdown risk of Covid infection/next 6 months. Patient is vaccinated to [place].  Missed a  call from [facility] regarding evusheld; will inorm Ms.Causey again.      #Gout/shingles prophylaxis- allopurinol/acyclovir STABLE.    # DISPOSITION: # refer to [facility] ASAP re: hoarseness of voice/lymphoma # labs in 10 days.  # follow up in 3 weeks- MD; Labs- cbc/cmp;LDH;  R-CHOP chemo D-2 fulphila-Dr.B  All questions were answered. The patient knows to call the clinic with any problems, questions or concerns.    Cammie Sickle, MD 01/22/2021 8:48 PM

## 2021-01-16 NOTE — Assessment & Plan Note (Addendum)
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  FISH-BCL6 gene rearrangement; triple expresser.  Good risk IPI.  #Proceed with R-CHOP chemotherapy today cycle 2. Labs today reviewed;  acceptable for treatment today.   2D echo 55% ejection fraction May 2022.   # hoarseness of voice/likely secondary to mediastinal lymphadenopathy/vocal cord palsy. Refer to ENT ASAP  # COVID EVUSHELD PROPHYLAXIS: Given the immunosuppressive effects of treatments I would recommend EVUSHELD prophylaxis.  IM injection x2 [same time]-cutdown risk of Covid infection/next 6 months. Patient is vaccinated to [place].  Missed a  call from [facility] regarding evusheld; will inorm Ms.Causey again.    #Gout/shingles prophylaxis- allopurinol/acyclovir STABLE.   # DISPOSITION: # refer to [facility] ASAP re: hoarseness of voice/lymphoma # labs in 10 days.  # follow up in 3 weeks- MD; Labs- cbc/cmp;LDH;  R-CHOP chemo D-2 fulphila-Dr.B

## 2021-01-16 NOTE — Progress Notes (Signed)
Vitals reviewed with MD and treatment team. Per MD to continue with treatment regimen. Treatment team updated.   Paquita Printy [employer]

## 2021-01-17 ENCOUNTER — Inpatient Hospital Stay: Payer: Medicaid Other

## 2021-01-17 ENCOUNTER — Other Ambulatory Visit: Payer: Self-pay | Admitting: Internal Medicine

## 2021-01-17 DIAGNOSIS — C8589 Other specified types of non-Hodgkin lymphoma, extranodal and solid organ sites: Secondary | ICD-10-CM

## 2021-01-17 DIAGNOSIS — Z5112 Encounter for antineoplastic immunotherapy: Secondary | ICD-10-CM | POA: Diagnosis not present

## 2021-01-17 MED ORDER — PEGFILGRASTIM-JMDB 6 MG/0.6ML ~~LOC~~ SOSY
6.0000 mg | PREFILLED_SYRINGE | Freq: Once | SUBCUTANEOUS | Status: AC
  Administered 2021-01-17: 6 mg via SUBCUTANEOUS
  Filled 2021-01-17: qty 0.6

## 2021-01-18 ENCOUNTER — Encounter: Payer: Self-pay | Admitting: Internal Medicine

## 2021-01-18 ENCOUNTER — Other Ambulatory Visit: Payer: Self-pay

## 2021-01-18 MED ORDER — MONTELUKAST SODIUM 10 MG PO TABS
ORAL_TABLET | ORAL | 2 refills | Status: DC
  Filled 2021-01-18 – 2021-01-24 (×3): qty 30, 30d supply, fill #0
  Filled 2021-02-22: qty 30, 30d supply, fill #1

## 2021-01-20 ENCOUNTER — Telehealth: Payer: Self-pay | Admitting: Internal Medicine

## 2021-01-20 ENCOUNTER — Other Ambulatory Visit: Payer: Self-pay

## 2021-01-20 MED ORDER — HYDROCOD POLST-CPM POLST ER 10-8 MG/5ML PO SUER: 5.0000 mL | Freq: Two times a day (BID) | ORAL | 0 refills | Status: DC | PRN

## 2021-01-20 NOTE — Telephone Encounter (Signed)
On 7/21-spoke with patient's wife regarding the possibility that patient might not be able to go back to work for the next 12 months or so given his aggressive lymphoma and need for aggressive therapies.  She will call us if any further questions.   Current prescription for Tussionex.  Follow-up as planned

## 2021-01-22 ENCOUNTER — Encounter: Payer: Self-pay | Admitting: Internal Medicine

## 2021-01-24 ENCOUNTER — Other Ambulatory Visit: Payer: Self-pay

## 2021-01-24 ENCOUNTER — Encounter: Payer: Self-pay | Admitting: Internal Medicine

## 2021-01-24 ENCOUNTER — Other Ambulatory Visit: Payer: Self-pay | Admitting: Internal Medicine

## 2021-01-24 MED ORDER — LIDOCAINE-PRILOCAINE 2.5-2.5 % EX CREA
TOPICAL_CREAM | CUTANEOUS | 0 refills | Status: DC
  Filled 2021-01-24: qty 30, 30d supply, fill #0

## 2021-01-25 ENCOUNTER — Other Ambulatory Visit: Payer: Self-pay

## 2021-01-26 ENCOUNTER — Inpatient Hospital Stay: Payer: Medicaid Other

## 2021-01-26 ENCOUNTER — Other Ambulatory Visit: Payer: Self-pay

## 2021-01-26 DIAGNOSIS — C8589 Other specified types of non-Hodgkin lymphoma, extranodal and solid organ sites: Secondary | ICD-10-CM

## 2021-01-26 DIAGNOSIS — Z5112 Encounter for antineoplastic immunotherapy: Secondary | ICD-10-CM | POA: Diagnosis not present

## 2021-01-26 LAB — CBC WITH DIFFERENTIAL/PLATELET
Abs Immature Granulocytes: 0.56 10*3/uL — ABNORMAL HIGH (ref 0.00–0.07)
Basophils Absolute: 0 10*3/uL (ref 0.0–0.1)
Basophils Relative: 0 %
Eosinophils Absolute: 0 10*3/uL (ref 0.0–0.5)
Eosinophils Relative: 0 %
HCT: 34.6 % — ABNORMAL LOW (ref 39.0–52.0)
Hemoglobin: 11.5 g/dL — ABNORMAL LOW (ref 13.0–17.0)
Immature Granulocytes: 7 %
Lymphocytes Relative: 15 %
Lymphs Abs: 1.2 10*3/uL (ref 0.7–4.0)
MCH: 30.5 pg (ref 26.0–34.0)
MCHC: 33.2 g/dL (ref 30.0–36.0)
MCV: 91.8 fL (ref 80.0–100.0)
Monocytes Absolute: 1.1 10*3/uL — ABNORMAL HIGH (ref 0.1–1.0)
Monocytes Relative: 13 %
Neutro Abs: 5.3 10*3/uL (ref 1.7–7.7)
Neutrophils Relative %: 65 %
Platelets: 189 10*3/uL (ref 150–400)
RBC: 3.77 MIL/uL — ABNORMAL LOW (ref 4.22–5.81)
RDW: 18.2 % — ABNORMAL HIGH (ref 11.5–15.5)
Smear Review: NORMAL
WBC: 8.2 10*3/uL (ref 4.0–10.5)
nRBC: 0 % (ref 0.0–0.2)

## 2021-01-26 LAB — COMPREHENSIVE METABOLIC PANEL
ALT: 12 U/L (ref 0–44)
AST: 26 U/L (ref 15–41)
Albumin: 3.5 g/dL (ref 3.5–5.0)
Alkaline Phosphatase: 96 U/L (ref 38–126)
Anion gap: 9 (ref 5–15)
BUN: <5 mg/dL — ABNORMAL LOW (ref 6–20)
CO2: 27 mmol/L (ref 22–32)
Calcium: 9.4 mg/dL (ref 8.9–10.3)
Chloride: 101 mmol/L (ref 98–111)
Creatinine, Ser: 0.49 mg/dL — ABNORMAL LOW (ref 0.61–1.24)
GFR, Estimated: >60 mL/min (ref >60)
Glucose, Bld: 115 mg/dL — ABNORMAL HIGH (ref 70–99)
Potassium: 4.2 mmol/L (ref 3.5–5.1)
Sodium: 137 mmol/L (ref 135–145)
Total Bilirubin: 0.1 mg/dL — ABNORMAL LOW (ref 0.3–1.2)
Total Protein: 6.4 g/dL — ABNORMAL LOW (ref 6.5–8.1)

## 2021-02-01 ENCOUNTER — Encounter: Payer: Self-pay | Admitting: Internal Medicine

## 2021-02-01 ENCOUNTER — Other Ambulatory Visit: Payer: Self-pay

## 2021-02-01 ENCOUNTER — Ambulatory Visit: Payer: Self-pay | Admitting: Pharmacy Technician

## 2021-02-01 DIAGNOSIS — Z79899 Other long term (current) drug therapy: Secondary | ICD-10-CM

## 2021-02-01 NOTE — Progress Notes (Signed)
Completed [facility] application and contract.  Patient agreed to all terms of the [facility] contract.    [facility] will provide medication assistance to patient until Medicaid becomes active.    Provided patient with Civil engineer, contracting based on his particular needs.    [facility]

## 2021-02-06 ENCOUNTER — Inpatient Hospital Stay: Payer: Medicaid Other | Attending: Oncology

## 2021-02-06 ENCOUNTER — Encounter: Payer: Self-pay | Admitting: Internal Medicine

## 2021-02-06 ENCOUNTER — Other Ambulatory Visit: Payer: Self-pay

## 2021-02-06 ENCOUNTER — Inpatient Hospital Stay: Payer: Medicaid Other

## 2021-02-06 ENCOUNTER — Inpatient Hospital Stay (HOSPITAL_BASED_OUTPATIENT_CLINIC_OR_DEPARTMENT_OTHER): Payer: Medicaid Other | Admitting: Internal Medicine

## 2021-02-06 VITALS — BP 135/82 | HR 111 | Temp 97.8°F | Resp 16

## 2021-02-06 VITALS — BP 123/85 | HR 116 | Resp 16 | Wt 148.2 lb

## 2021-02-06 DIAGNOSIS — R490 Dysphonia: Secondary | ICD-10-CM

## 2021-02-06 DIAGNOSIS — C8589 Other specified types of non-Hodgkin lymphoma, extranodal and solid organ sites: Secondary | ICD-10-CM

## 2021-02-06 DIAGNOSIS — M109 Gout, unspecified: Secondary | ICD-10-CM | POA: Diagnosis not present

## 2021-02-06 DIAGNOSIS — C8339 Diffuse large B-cell lymphoma, extranodal and solid organ sites: Secondary | ICD-10-CM | POA: Insufficient documentation

## 2021-02-06 DIAGNOSIS — R590 Localized enlarged lymph nodes: Secondary | ICD-10-CM | POA: Insufficient documentation

## 2021-02-06 DIAGNOSIS — R000 Tachycardia, unspecified: Secondary | ICD-10-CM | POA: Diagnosis not present

## 2021-02-06 LAB — CBC WITH DIFFERENTIAL/PLATELET
Abs Immature Granulocytes: 0.2 10*3/uL — ABNORMAL HIGH (ref 0.00–0.07)
Basophils Absolute: 0.1 10*3/uL (ref 0.0–0.1)
Basophils Relative: 0 %
Eosinophils Absolute: 0.1 10*3/uL (ref 0.0–0.5)
Eosinophils Relative: 1 %
HCT: 35.5 % — ABNORMAL LOW (ref 39.0–52.0)
Hemoglobin: 11.8 g/dL — ABNORMAL LOW (ref 13.0–17.0)
Immature Granulocytes: 1 %
Lymphocytes Relative: 10 %
Lymphs Abs: 1.7 10*3/uL (ref 0.7–4.0)
MCH: 30.4 pg (ref 26.0–34.0)
MCHC: 33.2 g/dL (ref 30.0–36.0)
MCV: 91.5 fL (ref 80.0–100.0)
Monocytes Absolute: 2.2 10*3/uL — ABNORMAL HIGH (ref 0.1–1.0)
Monocytes Relative: 13 %
Neutro Abs: 12.1 10*3/uL — ABNORMAL HIGH (ref 1.7–7.7)
Neutrophils Relative %: 75 %
Platelets: 346 10*3/uL (ref 150–400)
RBC: 3.88 MIL/uL — ABNORMAL LOW (ref 4.22–5.81)
RDW: 18.7 % — ABNORMAL HIGH (ref 11.5–15.5)
WBC: 16.3 10*3/uL — ABNORMAL HIGH (ref 4.0–10.5)
nRBC: 0 % (ref 0.0–0.2)

## 2021-02-06 LAB — COMPREHENSIVE METABOLIC PANEL
ALT: 12 U/L (ref 0–44)
AST: 43 U/L — ABNORMAL HIGH (ref 15–41)
Albumin: 3.4 g/dL — ABNORMAL LOW (ref 3.5–5.0)
Alkaline Phosphatase: 98 U/L (ref 38–126)
Anion gap: 9 (ref 5–15)
BUN: 7 mg/dL (ref 6–20)
CO2: 26 mmol/L (ref 22–32)
Calcium: 9.1 mg/dL (ref 8.9–10.3)
Chloride: 101 mmol/L (ref 98–111)
Creatinine, Ser: 0.45 mg/dL — ABNORMAL LOW (ref 0.61–1.24)
GFR, Estimated: >60 mL/min (ref >60)
Glucose, Bld: 101 mg/dL — ABNORMAL HIGH (ref 70–99)
Potassium: 3.4 mmol/L — ABNORMAL LOW (ref 3.5–5.1)
Sodium: 136 mmol/L (ref 135–145)
Total Bilirubin: 0.5 mg/dL (ref 0.3–1.2)
Total Protein: 6.3 g/dL — ABNORMAL LOW (ref 6.5–8.1)

## 2021-02-06 MED ORDER — MONTELUKAST SODIUM 10 MG PO TABS
10.0000 mg | ORAL_TABLET | Freq: Every day | ORAL | Status: DC
  Administered 2021-02-06: 10 mg via ORAL
  Filled 2021-02-06: qty 1

## 2021-02-06 MED ORDER — CYCLOPHOSPHAMIDE CHEMO INJECTION 1 GM
750.0000 mg/m2 | Freq: Once | INTRAMUSCULAR | Status: AC
  Administered 2021-02-06: 1360 mg via INTRAVENOUS
  Filled 2021-02-06: qty 68

## 2021-02-06 MED ORDER — SODIUM CHLORIDE 0.9 % IV SOLN
10.0000 mg | Freq: Once | INTRAVENOUS | Status: AC
  Administered 2021-02-06: 10 mg via INTRAVENOUS
  Filled 2021-02-06: qty 10

## 2021-02-06 MED ORDER — DOXORUBICIN HCL CHEMO IV INJECTION 2 MG/ML
50.0000 mg/m2 | Freq: Once | INTRAVENOUS | Status: AC
  Administered 2021-02-06: 90 mg via INTRAVENOUS
  Filled 2021-02-06: qty 25

## 2021-02-06 MED ORDER — FAMOTIDINE 20 MG IN NS 100 ML IVPB
20.0000 mg | Freq: Once | INTRAVENOUS | Status: AC
  Administered 2021-02-06: 20 mg via INTRAVENOUS
  Filled 2021-02-06: qty 20

## 2021-02-06 MED ORDER — VINCRISTINE SULFATE CHEMO INJECTION 1 MG/ML
2.0000 mg | Freq: Once | INTRAVENOUS | Status: AC
  Administered 2021-02-06: 2 mg via INTRAVENOUS
  Filled 2021-02-06: qty 2

## 2021-02-06 MED ORDER — HEPARIN SOD (PORK) LOCK FLUSH 100 UNIT/ML IV SOLN
500.0000 [IU] | Freq: Once | INTRAVENOUS | Status: AC | PRN
  Administered 2021-02-06: 500 [IU]
  Filled 2021-02-06: qty 5

## 2021-02-06 MED ORDER — HEPARIN SOD (PORK) LOCK FLUSH 100 UNIT/ML IV SOLN
INTRAVENOUS | Status: AC
  Filled 2021-02-06: qty 5

## 2021-02-06 MED ORDER — PALONOSETRON HCL INJECTION 0.25 MG/5ML
0.2500 mg | Freq: Once | INTRAVENOUS | Status: AC
  Administered 2021-02-06: 0.25 mg via INTRAVENOUS
  Filled 2021-02-06: qty 5

## 2021-02-06 MED ORDER — DIPHENHYDRAMINE HCL 25 MG PO CAPS
50.0000 mg | ORAL_CAPSULE | Freq: Once | ORAL | Status: AC
  Administered 2021-02-06: 50 mg via ORAL
  Filled 2021-02-06: qty 2

## 2021-02-06 MED ORDER — SODIUM CHLORIDE 0.9 % IV SOLN
150.0000 mg | Freq: Once | INTRAVENOUS | Status: AC
  Administered 2021-02-06: 150 mg via INTRAVENOUS
  Filled 2021-02-06: qty 150

## 2021-02-06 MED ORDER — ACETAMINOPHEN 325 MG PO TABS
650.0000 mg | ORAL_TABLET | Freq: Once | ORAL | Status: AC
  Administered 2021-02-06: 650 mg via ORAL
  Filled 2021-02-06: qty 2

## 2021-02-06 MED ORDER — SODIUM CHLORIDE 0.9 % IV SOLN
Freq: Once | INTRAVENOUS | Status: AC
  Filled 2021-02-06: qty 250

## 2021-02-06 MED ORDER — SODIUM CHLORIDE 0.9 % IV SOLN
375.0000 mg/m2 | Freq: Once | INTRAVENOUS | Status: AC
  Administered 2021-02-06: 700 mg via INTRAVENOUS
  Filled 2021-02-06: qty 50

## 2021-02-06 NOTE — Patient Instructions (Signed)
#  Call as regarding the medication-metoprolol.

## 2021-02-06 NOTE — Patient Instructions (Signed)
[facility] ONCOLOGY  Discharge Instructions: Thank you for choosing [facility] to provide your oncology and hematology care.  If you have a lab appointment with the [facility], please go directly to the [facility] and check in at the registration area.  Wear comfortable clothing and clothing appropriate for easy access to any Portacath or PICC line.   We strive to give you quality time with your provider. You may need to reschedule your appointment if you arrive late (15 or more minutes).  Arriving late affects you and other patients whose appointments are after yours.  Also, if you miss three or more appointments without notifying the office, you may be dismissed from the clinic at the provider's discretion.      For prescription refill requests, have your pharmacy contact our office and allow 72 hours for refills to be completed.    Today you received the following chemotherapy and/or immunotherapy agents Adriamycin, Vincristine, Cytoxan, & Rituxan.      To help prevent nausea and vomiting after your treatment, we encourage you to take your nausea medication as directed.  BELOW ARE SYMPTOMS THAT SHOULD BE REPORTED IMMEDIATELY: *FEVER GREATER THAN 100.4 F (38 C) OR HIGHER *CHILLS OR SWEATING *NAUSEA AND VOMITING THAT IS NOT CONTROLLED WITH YOUR NAUSEA MEDICATION *UNUSUAL SHORTNESS OF BREATH *UNUSUAL BRUISING OR BLEEDING *URINARY PROBLEMS (pain or burning when urinating, or frequent urination) *BOWEL PROBLEMS (unusual diarrhea, constipation, pain near the anus) TENDERNESS IN MOUTH AND THROAT WITH OR WITHOUT PRESENCE OF ULCERS (sore throat, sores in mouth, or a toothache) UNUSUAL RASH, SWELLING OR PAIN  UNUSUAL VAGINAL DISCHARGE OR ITCHING   Items with * indicate a potential emergency and should be followed up as soon as possible or go to the Emergency Department if any problems should occur.  Please show the CHEMOTHERAPY ALERT CARD or  IMMUNOTHERAPY ALERT CARD at check-in to the Emergency Department and triage nurse.  Should you have questions after your visit or need to cancel or reschedule your appointment, please contact [facility]  [phone] and follow the prompts.  Office hours are 8:00 a.m. to 4:30 p.m. Monday - Friday. Please note that voicemails left after 4:00 p.m. may not be returned until the following business day.  We are closed weekends and major holidays. You have access to a nurse at all times for urgent questions. Please call the main number to the clinic [phone] and follow the prompts.  For any non-urgent questions, you may also contact your provider using MyChart. We now offer e-Visits for anyone 72 and older to request care online for non-urgent symptoms. For details visit [email or web address].   Also download the MyChart app! Go to the app store, search "MyChart", open the app, select [facility], and log in with your MyChart username and password.  Due to Covid, a mask is required upon entering the hospital/clinic. If you do not have a mask, one will be given to you upon arrival. For doctor visits, patients may have 1 support person aged 21 or older with them. For treatment visits, patients cannot have anyone with them due to current Covid guidelines and our immunocompromised population.

## 2021-02-06 NOTE — Progress Notes (Signed)
HR 116. Per Jonne Ply., RN per Dr. Rogue Bussing, okay to proceed with treatment.

## 2021-02-06 NOTE — Progress Notes (Signed)
[place] NOTE  Patient Care Team: System, Provider Not In as PCP - General [name], RN as Oncology Nurse Navigator  CHIEF COMPLAINTS/PURPOSE OF CONSULTATION: lymphoma   #  Oncology History Overview Note  #June 2022-Large cell lymphoma-s/p biopsy left supraclavicular lymph node.  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  FISH studies pending. Recommend hold off bone marrow biopsy. PET June 9th 2022-Central left hilar and subcarinal mass with metastatic disease to the left lower lobe and substantial mediastinal and bilateral hilar adenopathy as well as mild lower neck level V adenopathy on the left. Revised IPI-good risk .  # FISH PANEL: BCL6 gene arrangement; negative FISH-Bcl-2/MCY; however triple expresser.   # June 27th, 2022- R-CHOP cycle #1.  # SURVIVORSHIP:   # GENETICS:   DIAGNOSIS:   STAGE:         ;  GOALS:  CURRENT/MOST RECENT THERAPY :     Large cell lymphoma of extranodal and solid organ sites [facility])  12/22/2020 Initial Diagnosis   Large cell lymphoma of extranodal and solid organ sites [facility])    12/26/2020 -  Chemotherapy    Patient is on Treatment Plan: NON-HODGKINS LYMPHOMA R-CHOP Q21D          HISTORY OF PRESENTING ILLNESS:  Curtis Manning. 50 y.o.  male patient stage IV diffuse large B-cell lymphoma is here to proceed with chemotherapy cycle #3 today.  Patient continues to complain of hoarseness of voice.  Patient has not been evaluated by ENT as referred at last visit.  No skin rash.  No nausea vomiting.  No fever chills.   Review of Systems  Constitutional:  Positive for malaise/fatigue and weight loss. Negative for chills, diaphoresis and fever.  HENT:  Positive for sore throat. Negative for nosebleeds.   Eyes:  Negative for double vision.  Respiratory:  Positive for shortness of breath. Negative for cough, hemoptysis, sputum production and wheezing.   Cardiovascular:  Negative for chest pain,  palpitations, orthopnea and leg swelling.  Gastrointestinal:  Negative for abdominal pain, blood in stool, constipation, diarrhea, heartburn, melena, nausea and vomiting.  Genitourinary:  Negative for dysuria, frequency and urgency.  Musculoskeletal:  Negative for back pain and joint pain.  Skin: Negative.  Negative for itching and rash.  Neurological:  Negative for dizziness, tingling, focal weakness, weakness and headaches.  Endo/Heme/Allergies:  Does not bruise/bleed easily.  Psychiatric/Behavioral:  Negative for depression. The patient is not nervous/anxious and does not have insomnia.     MEDICAL HISTORY:  Past Medical History:  Diagnosis Date  . A-fib ([place])   . Asthma   . GERD (gastroesophageal reflux disease)   . History of COVID-19   . Lung mass   . Pleural effusion     SURGICAL HISTORY: Past Surgical History:  Procedure Laterality Date  . IR IMAGING GUIDED PORT INSERTION  12/16/2020    SOCIAL HISTORY: Social History   Socioeconomic History  . Marital status: Married    Spouse name: Not on file  . Number of children: Not on file  . Years of education: Not on file  . Highest education level: Not on file  Occupational History  . Not on file  Tobacco Use  . Smoking status: Former    Packs/day: 0.50    Years: 20.00    Pack years: 10.00    Types: Cigarettes    Quit date: 11/25/2020    Years since quitting: 0.2  . Smokeless tobacco: Never  Vaping Use  .  Vaping Use: Never used  Substance and Sexual Activity  . Alcohol use: Yes    Comment: "5 to 6 shots on weekends"  . Drug use: Never  . Sexual activity: Not on file  Other Topics Concern  . Not on file  Social History Narrative   Patient is a active smoker.;  Also liquor.  Lives with his wife in [place].  No children.  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 High blood pressure Mother   . Heart Problems Father   . Stomach cancer Paternal Grandmother     ALLERGIES:  has No Known Allergies.  MEDICATIONS:  Current Outpatient Medications  Medication Sig Dispense Refill  . acyclovir (ZOVIRAX) 400 MG tablet Take 1 tablet (400 mg total) by mouth 2 (two) times daily.  [to prevent shingles] 60 tablet 3  . albuterol (PROVENTIL) (2.5 MG/3ML) 0.083% nebulizer solution Inhale 3 mLs into the lungs every 6 (six) hours as needed.    Marland Kitchen albuterol (VENTOLIN HFA) 108 (90 Base) MCG/ACT inhaler Inhale 1-2 puffs into the lungs every 4 (four) hours as needed for wheezing or shortness of breath. 1 each 2  . allopurinol (ZYLOPRIM) 300 MG tablet Take 1 tablet (300 mg total) by mouth 2 (two) times daily. 120 tablet 0  . cholecalciferol (VITAMIN D) 25 MCG tablet Take 1 tablet (1,000 Units total) by mouth daily. Can take any over-the-counter.    . diltiazem (CARDIZEM CD) 240 MG 24 hr capsule Take 1 capsule (240 mg total) by mouth daily. 30 capsule 0  . diphenhydrAMINE (BENADRYL) 25 MG tablet Take 25 mg by mouth every 6 (six) hours as needed.    . feeding supplement (ENSURE ENLIVE / ENSURE PLUS) LIQD Take 237 mLs by mouth 3 (three) times daily between meals. 237 mL 12  . guaiFENesin-codeine 100-10 MG/5ML syrup Take 5 mLs by mouth every 6 (six) hours as needed.    Marland Kitchen HYDROcodone-acetaminophen (NORCO/VICODIN) 5-325 MG tablet Take 1 tablet by mouth every 6 (six) hours as needed.    Marland Kitchen ipratropium-albuterol (DUONEB) 0.5-2.5 (3) MG/3ML SOLN Take 3 mLs by nebulization every 6 (six) hours as needed. 75 mL 2  . lidocaine-prilocaine (EMLA) cream Apply topically as directed 30-45 minutes prior to port access. 30 g 0  . metoprolol tartrate (LOPRESSOR) 50 MG tablet Take 1 tablet (50 mg total) by mouth every 6 (six)  hours. 120 tablet 0  . montelukast (SINGULAIR) 10 MG tablet Start 2 days prior to infusion as direction by your provider. 30 tablet 2  . Multiple Vitamin (MULTIVITAMIN WITH MINERALS) TABS tablet Take 1 tablet by mouth daily.    Marland Kitchen omeprazole (PRILOSEC) 10 MG capsule Take 20 mg by mouth daily.    . ondansetron (ZOFRAN) 4 MG tablet Take 2 tablets (8 mg) by mouth every 8 hours as needed for nausea or vomiting 80 tablet 1  . predniSONE (DELTASONE) 20 MG tablet Take 5 tablets (100 mg) by mouth daily for 5 days. Start on the day chemo starts. 25 tablet 5  . prochlorperazine (COMPAZINE) 10 MG tablet Take 1 tablet (10 mg total) by mouth every 6 (six) hours as needed  for nausea or vomiting. 40 tablet 1  . albuterol (VENTOLIN HFA) 108 (90 Base) MCG/ACT inhaler Inhale 2 inhalations into the lungs once every 6 (six) hours as needed. (Patient not taking: Reported on 02/06/2021) 6.7 g 0  . chlorpheniramine-HYDROcodone (TUSSIONEX) 10-8 MG/5ML SUER Take 5 mLs by mouth every 12 (twelve) hours as needed for cough. (Patient not taking: Reported on 02/06/2021) 140 mL 0   No current facility-administered medications for this visit.      Marland Kitchen  PHYSICAL EXAMINATION: ECOG PERFORMANCE STATUS: 1 - Symptomatic but completely ambulatory  Vitals:   02/06/21 0846 02/06/21 0928  BP: 123/85   Pulse: (!) 123 (!) 116  Resp: 16   SpO2: 96%     Filed Weights   02/06/21 0846  Weight: 148 lb 3.2 oz (67.2 kg)     Physical Exam Vitals and nursing note reviewed.  Constitutional:      Comments: Ambulating: Independently; alone.  HENT:     Head: Normocephalic and atraumatic.     Mouth/Throat:     Pharynx: Oropharynx is clear.  Eyes:     Extraocular Movements: Extraocular movements intact.     Pupils: Pupils are equal, round, and reactive to light.  Cardiovascular:     Rate and Rhythm: Regular rhythm. Tachycardia present.  Pulmonary:     Comments: Decreased breath sounds bilaterally left more than right.  Abdominal:      Palpations: Abdomen is soft.  Musculoskeletal:        General: Normal range of motion.     Cervical back: Normal range of motion.  Skin:    General: Skin is warm.  Neurological:     General: No focal deficit present.     Mental Status: He is alert and oriented to person, place, and time.  Psychiatric:        Behavior: Behavior normal.        Judgment: Judgment normal.     LABORATORY DATA:  I have reviewed the data as listed Lab Results  Component Value Date   WBC 16.3 (H) 02/06/2021   HGB 11.8 (L) 02/06/2021   HCT 35.5 (L) 02/06/2021   MCV 91.5 02/06/2021   PLT 346 02/06/2021   Recent Labs    01/16/21 0829 01/26/21 1450 02/06/21 0824  NA 137 137 136  K 3.7 4.2 3.4*  CL 102 101 101  CO2 24 27 26   GLUCOSE 104* 115* 101*  BUN 11 <5* 7  CREATININE 0.64 0.49* 0.45*  CALCIUM 9.2 9.4 9.1  GFRNONAA >60 >60 >60  PROT 6.3* 6.4* 6.3*  ALBUMIN 3.4* 3.5 3.4*  AST 28 26 43*  ALT 16 12 12   ALKPHOS 77 96 98  BILITOT 0.4 0.1* 0.5    RADIOGRAPHIC STUDIES: I have personally reviewed the radiological images as listed and agreed with the findings in the report. No results found.  ASSESSMENT & PLAN:   Large cell lymphoma of extranodal and solid organ sites [facility]) #Large cell lymphoma-s/p biopsy left supraclavicular lymph node.  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  FISH-BCL6 gene rearrangement; triple expresser.  Good risk IPI.  # Proceed with R-CHOP chemotherapy today cycle 3. Labs today reviewed;  acceptable for treatment today.   2D echo 55% ejection fraction May 2022.  We will plan to get PET scan after this cycle.   # hoarseness of voice/likely secondary to mediastinal lymphadenopathy/vocal cord palsy. awaiting to ENT ASAP   # COVID EVUSHELD PROPHYLAXIS: Given the immunosuppressive effects of treatments I would  recommend EVUSHELD prophylaxis.  IM injection x2 [same time]-cutdown risk of Covid infection/next 6 months. Patient is vaccinated to [place].   Missed a  call from [facility] regarding evusheld.    #Gout/shingles prophylaxis- allopurinol/acyclovir- STABLE.   #Sinus tachycardia-chronic.  Unclear if patient is on metoprolol.  Patient will call us with the medication/dose.  If patient not on beta-blocker would recommend Coreg.   # DISPOSITION: # chemo today; fulhila tomorrw # SECOND time-refer to [facility] ASAP re: hoarseness of voice/lymphoma # labs in 10 days.  # follow up in 3 weeks- MD; Labs- cbc/cmp;LDH;  R-CHOP chemo D-2 fulphila' PET prior--Dr.B  All questions were answered. The patient knows to call the clinic with any problems, questions or concerns.    Cammie Sickle, MD 02/06/2021 10:17 PM

## 2021-02-06 NOTE — Progress Notes (Signed)
Pt states he coughed up a handful of blood a few days ago (not ale to remember the exact day).

## 2021-02-06 NOTE — Assessment & Plan Note (Addendum)
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  FISH-BCL6 gene rearrangement; triple expresser.  Good risk IPI.  # Proceed with R-CHOP chemotherapy today cycle 3. Labs today reviewed;  acceptable for treatment today.   2D echo 55% ejection fraction May 2022.  We will plan to get PET scan after this cycle.  # hoarseness of voice/likely secondary to mediastinal lymphadenopathy/vocal cord palsy. awaiting to ENT ASAP  # COVID EVUSHELD PROPHYLAXIS: Given the immunosuppressive effects of treatments I would recommend EVUSHELD prophylaxis.  IM injection x2 [same time]-cutdown risk of Covid infection/next 6 months. Patient is vaccinated to [place].  Missed a  call from [facility] regarding evusheld.   #Gout/shingles prophylaxis- allopurinol/acyclovir- STABLE.   #Sinus tachycardia-chronic.  Unclear if patient is on metoprolol.  Patient will call us with the medication/dose.  If patient not on beta-blocker would recommend Coreg.  # DISPOSITION: # chemo today; fulhila tomorrw # SECOND time-refer to [facility] ASAP re: hoarseness of voice/lymphoma # labs in 10 days.  # follow up in 3 weeks- MD; Labs- cbc/cmp;LDH;  R-CHOP chemo D-2 fulphila' PET prior--Dr.B

## 2021-02-07 ENCOUNTER — Inpatient Hospital Stay: Payer: Medicaid Other

## 2021-02-07 DIAGNOSIS — C8589 Other specified types of non-Hodgkin lymphoma, extranodal and solid organ sites: Secondary | ICD-10-CM

## 2021-02-07 DIAGNOSIS — C8339 Diffuse large B-cell lymphoma, extranodal and solid organ sites: Secondary | ICD-10-CM | POA: Diagnosis not present

## 2021-02-07 MED ORDER — PEGFILGRASTIM-JMDB 6 MG/0.6ML ~~LOC~~ SOSY
6.0000 mg | PREFILLED_SYRINGE | Freq: Once | SUBCUTANEOUS | Status: AC
  Administered 2021-02-07: 6 mg via SUBCUTANEOUS
  Filled 2021-02-07: qty 0.6

## 2021-02-08 ENCOUNTER — Other Ambulatory Visit: Payer: Self-pay

## 2021-02-08 ENCOUNTER — Encounter: Payer: Self-pay | Admitting: Internal Medicine

## 2021-02-08 MED ORDER — ALBUTEROL SULFATE HFA 108 (90 BASE) MCG/ACT IN AERS
INHALATION_SPRAY | RESPIRATORY_TRACT | 11 refills | Status: DC
  Filled 2021-02-08: qty 6.7, 25d supply, fill #0

## 2021-02-10 ENCOUNTER — Encounter: Payer: Self-pay | Admitting: Internal Medicine

## 2021-02-10 ENCOUNTER — Other Ambulatory Visit: Payer: Self-pay

## 2021-02-10 MED ORDER — ALBUTEROL SULFATE (2.5 MG/3ML) 0.083% IN NEBU
INHALATION_SOLUTION | RESPIRATORY_TRACT | 12 refills | Status: DC
  Filled 2021-02-10: qty 90, 8d supply, fill #0

## 2021-02-13 ENCOUNTER — Other Ambulatory Visit: Payer: Self-pay

## 2021-02-14 ENCOUNTER — Other Ambulatory Visit: Payer: Self-pay

## 2021-02-14 ENCOUNTER — Encounter: Payer: Self-pay | Admitting: Internal Medicine

## 2021-02-14 MED ORDER — AMOXICILLIN-POT CLAVULANATE 500-125 MG PO TABS
ORAL_TABLET | ORAL | 0 refills | Status: DC
  Filled 2021-02-14: qty 20, 10d supply, fill #0

## 2021-02-14 MED ORDER — METHYLPREDNISOLONE 4 MG PO TBPK
ORAL_TABLET | ORAL | 0 refills | Status: DC
  Filled 2021-02-14: qty 21, 6d supply, fill #0

## 2021-02-16 ENCOUNTER — Inpatient Hospital Stay: Payer: Medicaid Other

## 2021-02-16 DIAGNOSIS — C8339 Diffuse large B-cell lymphoma, extranodal and solid organ sites: Secondary | ICD-10-CM | POA: Diagnosis not present

## 2021-02-16 DIAGNOSIS — C8589 Other specified types of non-Hodgkin lymphoma, extranodal and solid organ sites: Secondary | ICD-10-CM

## 2021-02-16 LAB — CBC WITH DIFFERENTIAL/PLATELET
Abs Immature Granulocytes: 0.48 10*3/uL — ABNORMAL HIGH (ref 0.00–0.07)
Basophils Absolute: 0.1 10*3/uL (ref 0.0–0.1)
Basophils Relative: 1 %
Eosinophils Absolute: 0 10*3/uL (ref 0.0–0.5)
Eosinophils Relative: 0 %
HCT: 33.9 % — ABNORMAL LOW (ref 39.0–52.0)
Hemoglobin: 11.4 g/dL — ABNORMAL LOW (ref 13.0–17.0)
Immature Granulocytes: 7 %
Lymphocytes Relative: 11 %
Lymphs Abs: 0.7 10*3/uL (ref 0.7–4.0)
MCH: 31.4 pg (ref 26.0–34.0)
MCHC: 33.6 g/dL (ref 30.0–36.0)
MCV: 93.4 fL (ref 80.0–100.0)
Monocytes Absolute: 1 10*3/uL (ref 0.1–1.0)
Monocytes Relative: 15 %
Neutro Abs: 4.6 10*3/uL (ref 1.7–7.7)
Neutrophils Relative %: 66 %
Platelets: 172 10*3/uL (ref 150–400)
RBC: 3.63 MIL/uL — ABNORMAL LOW (ref 4.22–5.81)
RDW: 19 % — ABNORMAL HIGH (ref 11.5–15.5)
Smear Review: ADEQUATE
WBC: 6.9 10*3/uL (ref 4.0–10.5)
nRBC: 0.6 % — ABNORMAL HIGH (ref 0.0–0.2)

## 2021-02-16 LAB — BASIC METABOLIC PANEL
Anion gap: 9 (ref 5–15)
BUN: 7 mg/dL (ref 6–20)
CO2: 28 mmol/L (ref 22–32)
Calcium: 9.4 mg/dL (ref 8.9–10.3)
Chloride: 99 mmol/L (ref 98–111)
Creatinine, Ser: 0.53 mg/dL — ABNORMAL LOW (ref 0.61–1.24)
GFR, Estimated: >60 mL/min (ref >60)
Glucose, Bld: 167 mg/dL — ABNORMAL HIGH (ref 70–99)
Potassium: 4.5 mmol/L (ref 3.5–5.1)
Sodium: 136 mmol/L (ref 135–145)

## 2021-02-20 ENCOUNTER — Encounter: Payer: Self-pay | Admitting: Internal Medicine

## 2021-02-22 ENCOUNTER — Other Ambulatory Visit: Payer: Self-pay | Admitting: Internal Medicine

## 2021-02-23 ENCOUNTER — Other Ambulatory Visit: Payer: Self-pay

## 2021-02-23 ENCOUNTER — Encounter: Payer: Self-pay | Admitting: Internal Medicine

## 2021-02-23 ENCOUNTER — Ambulatory Visit
Admission: RE | Admit: 2021-02-23 | Discharge: 2021-02-23 | Disposition: A | Discharge status: Home / Self Care | Payer: Medicaid Other | Source: Ambulatory Visit | Attending: Internal Medicine | Admitting: Internal Medicine

## 2021-02-23 DIAGNOSIS — C8589 Other specified types of non-Hodgkin lymphoma, extranodal and solid organ sites: Secondary | ICD-10-CM

## 2021-02-23 LAB — GLUCOSE, CAPILLARY: Glucose-Capillary: 96 mg/dL (ref 70–99)

## 2021-02-23 MED ORDER — ALLOPURINOL 300 MG PO TABS
300.0000 mg | ORAL_TABLET | Freq: Two times a day (BID) | ORAL | 0 refills | Status: DC
  Filled 2021-02-23: qty 60, 30d supply, fill #0

## 2021-02-23 MED ORDER — FLUDEOXYGLUCOSE F - 18 (FDG) INJECTION
8.4700 | Freq: Once | INTRAVENOUS | Status: AC
  Administered 2021-02-23: 8.47 via INTRAVENOUS

## 2021-02-27 ENCOUNTER — Inpatient Hospital Stay: Payer: Medicaid Other

## 2021-02-27 ENCOUNTER — Other Ambulatory Visit: Payer: Self-pay

## 2021-02-27 ENCOUNTER — Encounter: Payer: Self-pay | Admitting: Internal Medicine

## 2021-02-27 ENCOUNTER — Inpatient Hospital Stay (HOSPITAL_BASED_OUTPATIENT_CLINIC_OR_DEPARTMENT_OTHER): Payer: Medicaid Other | Admitting: Internal Medicine

## 2021-02-27 VITALS — BP 128/80 | HR 140 | Temp 99.6°F | Resp 16 | Ht 69.0 in | Wt 153.0 lb

## 2021-02-27 DIAGNOSIS — C8589 Other specified types of non-Hodgkin lymphoma, extranodal and solid organ sites: Secondary | ICD-10-CM

## 2021-02-27 DIAGNOSIS — C8339 Diffuse large B-cell lymphoma, extranodal and solid organ sites: Secondary | ICD-10-CM | POA: Diagnosis not present

## 2021-02-27 DIAGNOSIS — R000 Tachycardia, unspecified: Secondary | ICD-10-CM

## 2021-02-27 DIAGNOSIS — Z95828 Presence of other vascular implants and grafts: Secondary | ICD-10-CM

## 2021-02-27 LAB — CBC WITH DIFFERENTIAL/PLATELET
Abs Immature Granulocytes: 0.06 10*3/uL (ref 0.00–0.07)
Basophils Absolute: 0.1 10*3/uL (ref 0.0–0.1)
Basophils Relative: 1 %
Eosinophils Absolute: 0 10*3/uL (ref 0.0–0.5)
Eosinophils Relative: 0 %
HCT: 36.3 % — ABNORMAL LOW (ref 39.0–52.0)
Hemoglobin: 12 g/dL — ABNORMAL LOW (ref 13.0–17.0)
Immature Granulocytes: 1 %
Lymphocytes Relative: 8 %
Lymphs Abs: 0.9 10*3/uL (ref 0.7–4.0)
MCH: 32.1 pg (ref 26.0–34.0)
MCHC: 33.1 g/dL (ref 30.0–36.0)
MCV: 97.1 fL (ref 80.0–100.0)
Monocytes Absolute: 1 10*3/uL (ref 0.1–1.0)
Monocytes Relative: 8 %
Neutro Abs: 10 10*3/uL — ABNORMAL HIGH (ref 1.7–7.7)
Neutrophils Relative %: 82 %
Platelets: 252 10*3/uL (ref 150–400)
RBC: 3.74 MIL/uL — ABNORMAL LOW (ref 4.22–5.81)
RDW: 20.1 % — ABNORMAL HIGH (ref 11.5–15.5)
WBC: 12.1 10*3/uL — ABNORMAL HIGH (ref 4.0–10.5)
nRBC: 0 % (ref 0.0–0.2)

## 2021-02-27 LAB — COMPREHENSIVE METABOLIC PANEL
ALT: 14 U/L (ref 0–44)
AST: 40 U/L (ref 15–41)
Albumin: 3.9 g/dL (ref 3.5–5.0)
Alkaline Phosphatase: 96 U/L (ref 38–126)
Anion gap: 10 (ref 5–15)
BUN: 7 mg/dL (ref 6–20)
CO2: 28 mmol/L (ref 22–32)
Calcium: 9.1 mg/dL (ref 8.9–10.3)
Chloride: 104 mmol/L (ref 98–111)
Creatinine, Ser: 0.33 mg/dL — ABNORMAL LOW (ref 0.61–1.24)
GFR, Estimated: >60 mL/min (ref >60)
Glucose, Bld: 119 mg/dL — ABNORMAL HIGH (ref 70–99)
Potassium: 3.5 mmol/L (ref 3.5–5.1)
Sodium: 142 mmol/L (ref 135–145)
Total Bilirubin: 0.4 mg/dL (ref 0.3–1.2)
Total Protein: 6.9 g/dL (ref 6.5–8.1)

## 2021-02-27 LAB — LACTATE DEHYDROGENASE: LDH: 517 U/L — ABNORMAL HIGH (ref 98–192)

## 2021-02-27 MED ORDER — DILTIAZEM HCL ER COATED BEADS 240 MG PO CP24: 240.0000 mg | ORAL_CAPSULE | Freq: Every day | ORAL | 1 refills | Status: DC

## 2021-02-27 MED ORDER — HEPARIN SOD (PORK) LOCK FLUSH 100 UNIT/ML IV SOLN
500.0000 [IU] | Freq: Once | INTRAVENOUS | Status: AC | PRN
  Administered 2021-02-27: 500 [IU]
  Filled 2021-02-27: qty 5

## 2021-02-27 MED ORDER — SODIUM CHLORIDE 0.9% FLUSH
10.0000 mL | Freq: Once | INTRAVENOUS | Status: AC
  Administered 2021-02-27: 10 mL via INTRAVENOUS
  Filled 2021-02-27: qty 10

## 2021-02-27 MED ORDER — SODIUM CHLORIDE 0.9 % IV SOLN
Freq: Once | INTRAVENOUS | Status: AC
  Filled 2021-02-27: qty 250

## 2021-02-27 MED ORDER — HEPARIN SOD (PORK) LOCK FLUSH 100 UNIT/ML IV SOLN
INTRAVENOUS | Status: AC
  Filled 2021-02-27: qty 5

## 2021-02-27 MED ORDER — HEPARIN SOD (PORK) LOCK FLUSH 100 UNIT/ML IV SOLN
500.0000 [IU] | Freq: Once | INTRAVENOUS | Status: DC
  Filled 2021-02-27: qty 5

## 2021-02-27 NOTE — Assessment & Plan Note (Addendum)
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  FISH-BCL6 gene rearrangement; triple expresser.  Good risk IPI. S/p 3 cycles of R-CHOP- PET scan-AUG 26th, 2022- 1. No interval improvement in intensely hypermetabolic bulky mediastinal lymphadenopathy. Some of the nodes masses are increased in size with no decrease in metabolic activity. No significant change in rounded hypermetabolic necrotic mass in the LEFT lower lobe; New hypermetabolic activity associated nodule along the LEFT oblique fissure. No improvement in bilateral hypermetabolic supraclavicular nodes.  # HOLD chemo today;  Discussed re: salvage chemo- RICE-followed by consolidation autologous stem cell transplant/or CAR-T therapy.  Given patient's preference we will reach out to [place] program for further directions.  We will make a referral.  # SInus Tachycardia: 140 heart rate get STAT EKG.   # hoarseness of voice/likely secondary to mediastinal lymphadenopathy/vocal cord palsy. HOLD off  ENT evaluation given above active issues   # COVID EVUSHELD PROPHYLAXIS: pt missed phone calls.   #Gout/shingles prophylaxis- allopurinol/acyclovir- STABLE.   #Sinus tachycardia-chronic- sec to underlying lymphoma-as per wife patient on metoprolol 200 mg once a day; also recommend Cardizem 240 mg CD once a day.    # DISPOSITION: # HOLD chemo # IVFs- 1lit today # STAT EKG- e; tachyacrdia # referral to [facility]/ Lymphoma program- Diffuse Large B cell lymphoma ASAP- # follow up TBD-Dr.B  Addendum EKG reviewed: Sinus tachycardia no evidence of any atrial/ventricular arrhythmias.  We will contact patient regarding initiation of salvage therapy.  # I reviewed the blood work- with the patient in detail; also reviewed the imaging independently [as summarized above]; and with the patient in detail.    # 40 minutes face-to-face with the patient discussing the above plan  of care; more than 50% of time spent on prognosis/ natural history; counseling and coordination.

## 2021-02-27 NOTE — Progress Notes (Signed)
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  FISH studies pending. Recommend hold off bone marrow biopsy. PET June 9th 2022-Central left hilar and subcarinal mass with metastatic disease to the left lower lobe and substantial mediastinal and bilateral hilar adenopathy as well as mild lower neck level V adenopathy on the left. Revised IPI-good risk .  # FISH PANEL: BCL6 gene arrangement; negative FISH-Bcl-2/MCY; however triple expresser.   # June 27th, 2022- R-CHOP cycle #1 s/p R-CHOP 3 cycles -August 2022 25th th -1. No interval improvement in intensely hypermetabolic bulky mediastinal lymphadenopathy. Some of the nodes masses are increased in size with no decrease in metabolic activity. 2. No significant change in rounded hypermetabolic necrotic mass in the LEFT lower lobe. 3. New hypermetabolic activity associated nodule along the LEFT oblique fissure. 4. No improvement in bilateral hypermetabolic supraclavicular nodes.  # SURVIVORSHIP:   # GENETICS:   DIAGNOSIS:   STAGE:         ;  GOALS:  CURRENT/MOST RECENT THERAPY :     Large cell lymphoma of extranodal and solid organ sites [facility])  12/22/2020 Initial Diagnosis   Large cell lymphoma of extranodal and solid organ sites [facility])   12/26/2020 - 02/07/2021 Chemotherapy          03/02/2021 -  Chemotherapy    Patient is on Treatment Plan: NON-HODGKINS LYMPHOMA R-ICE Q21D          HISTORY OF PRESENTING ILLNESS: Ambulating: Independently; accompanied by his wife. Curtis Manning. 50 y.o.   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  Patient continues to feel poorly.  Continues to have hoarseness of voice.  However no skin rash.  No nausea or vomiting.  No fevers or chills.   Review of Systems  Constitutional:  Positive for malaise/fatigue and weight loss. Negative for chills, diaphoresis and fever.  HENT:  Positive for sore throat. Negative for nosebleeds.   Eyes:  Negative for double vision.  Respiratory:  Positive for shortness of breath. Negative for cough, hemoptysis, sputum production and wheezing.   Cardiovascular:  Negative for chest pain, palpitations, orthopnea and leg swelling.  Gastrointestinal:  Negative for abdominal pain, blood in stool, constipation, diarrhea, heartburn, melena, nausea and vomiting.  Genitourinary:  Negative for dysuria, frequency and urgency.  Musculoskeletal:  Negative for back pain and joint pain.  Skin: Negative.  Negative for itching and rash.  Neurological:  Negative for dizziness, tingling, focal weakness, weakness and headaches.  Endo/Heme/Allergies:  Does not bruise/bleed easily.  Psychiatric/Behavioral:  Negative for depression. The patient is not nervous/anxious and does not have insomnia.     MEDICAL HISTORY:  Past Medical History:  Diagnosis Date  . A-fib ([place])   . Asthma   . GERD (gastroesophageal reflux disease)   . History of COVID-19   . Lung mass   . Lymphoma ([place])   . Pleural effusion     SURGICAL HISTORY: Past Surgical History:  Procedure Laterality Date  . IR IMAGING GUIDED PORT INSERTION  12/16/2020    SOCIAL HISTORY: Social History  Socioeconomic History  . Marital status: Married    Spouse name: Not on file  . Number of children: Not on file  . Years of education: Not on file  . Highest education level: Not on file  Occupational History  . Not on file  Tobacco Use  . Smoking status: Former    Packs/day: 0.50     Years: 20.00    Pack years: 10.00    Types: Cigarettes    Quit date: 11/25/2020    Years since quitting: 0.2  . Smokeless tobacco: Never  Vaping Use  . Vaping Use: Never used  Substance and Sexual Activity  . Alcohol use: Yes    Comment: "5 to 6 shots on weekends"  . Drug use: Never  . Sexual activity: Not on file  Other Topics Concern  . Not on file  Social History Narrative   Patient is a active smoker.;  Also liquor.  Lives with his wife in [place].  No children.  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 High blood pressure Mother   . Heart Problems Father   . Stomach cancer Paternal Grandmother     ALLERGIES:  has No Known Allergies.  MEDICATIONS:  Current Outpatient Medications  Medication Sig Dispense Refill  . acyclovir (ZOVIRAX) 400 MG tablet Take 1 tablet (400 mg total) by mouth 2 (two) times daily.  [to prevent shingles] 60 tablet 3  . albuterol (PROVENTIL) (2.5 MG/3ML) 0.083% nebulizer solution Inhale 3 mLs (2.5 mg total) by nebulization once every 6 (six) hours as needed for wheezing. 75 mL 12  . albuterol (VENTOLIN HFA) 108 (90 Base) MCG/ACT inhaler Inhale 1-2 puffs into the lungs every 4 (four) hours as needed for wheezing or shortness of breath. 1 each 2  . allopurinol (ZYLOPRIM) 300 MG tablet Take 1 tablet (300 mg total) by mouth 2 (two) times daily. 120 tablet 0  . cholecalciferol (VITAMIN D) 25 MCG tablet Take 1 tablet (1,000 Units total) by mouth daily. Can take any over-the-counter.    . diphenhydrAMINE (BENADRYL) 25 MG tablet Take 25 mg by mouth every 6 (six) hours as needed.    . feeding supplement (ENSURE ENLIVE / ENSURE PLUS) LIQD Take 237  mLs by mouth 3 (three) times daily between meals. 237 mL 12  . guaiFENesin-codeine 100-10 MG/5ML syrup Take 5 mLs by mouth every 6 (six) hours as needed.    Marland Kitchen HYDROcodone-acetaminophen (NORCO/VICODIN) 5-325 MG tablet Take 1 tablet by mouth every 6 (six) hours as needed.    Marland Kitchen ipratropium-albuterol (DUONEB) 0.5-2.5 (3) MG/3ML SOLN Take 3 mLs by nebulization every 6 (six) hours as needed. 75 mL 2  . montelukast (SINGULAIR) 10 MG tablet Take one tablet by mouth daily. Start 2 days prior to infusion as direction by your provider. 30 tablet 2  . Multiple Vitamin (MULTIVITAMIN WITH MINERALS) TABS tablet Take 1 tablet by mouth daily.    Marland Kitchen omeprazole (PRILOSEC) 10 MG capsule Take 20 mg by mouth daily.    . ondansetron (ZOFRAN) 4 MG tablet Take 2 tablets (8 mg) by mouth every 8 hours as needed for nausea or vomiting 80 tablet 1  .  prochlorperazine (COMPAZINE) 10 MG tablet Take 1 tablet (10 mg total) by mouth every 6 (six) hours as needed for nausea or vomiting. 40 tablet 1  . diltiazem (CARDIZEM CD) 240 MG 24 hr capsule Take 1 capsule (240 mg total) by mouth daily. 60 capsule 1  . fluconazole (DIFLUCAN) 100 MG tablet Take 4 tablets (400 mg total) by mouth once daily for 30 days. 120 tablet 0  . loratadine (CLARITIN) 10 MG tablet Take 10 mg by mouth daily.    . metoprolol (TOPROL-XL) 200 MG 24 hr tablet Take 1 tablet by mouth daily.    . pantoprazole (PROTONIX) 40 MG tablet TAKE ONE TABLET BY MOUTH ONCE DAILY. 30 tablet 0  . predniSONE (DELTASONE) 10 MG tablet Take 7 tablets (70 mg total) by mouth once daily for 30 days 210 tablet 0  . sulfamethoxazole-trimethoprim (BACTRIM DS) 800-160 MG tablet Take 1 tablet (160 mg of trimethoprim total) by mouth every Monday, Wednesday, and Friday for 30 days 12 tablet 0   No current facility-administered medications for this visit.      Marland Kitchen  PHYSICAL EXAMINATION: ECOG PERFORMANCE STATUS: 1 - Symptomatic but completely ambulatory  Vitals:   02/27/21 0829  BP:  128/80  Pulse: (!) 140  Resp: 16  Temp: 99.6 F (37.6 C)  SpO2: 96%    Filed Weights   02/27/21 0829  Weight: 153 lb (69.4 kg)     Physical Exam Vitals and nursing note reviewed.  HENT:     Head: Normocephalic and atraumatic.     Mouth/Throat:     Pharynx: Oropharynx is clear.  Eyes:     Extraocular Movements: Extraocular movements intact.     Pupils: Pupils are equal, round, and reactive to light.  Cardiovascular:     Rate and Rhythm: Regular rhythm. Tachycardia present.  Pulmonary:     Comments: Decreased breath sounds bilaterally left more than right.  Abdominal:     Palpations: Abdomen is soft.  Musculoskeletal:        General: Normal range of motion.     Cervical back: Normal range of motion.  Skin:    General: Skin is warm.  Neurological:     General: No focal deficit present.     Mental Status: He is alert and oriented to person, place, and time.  Psychiatric:        Behavior: Behavior normal.        Judgment: Judgment normal.     LABORATORY DATA:  I have reviewed the data as listed Lab Results  Component Value Date   WBC 12.3 (H) 03/02/2021   HGB 10.0 (L) 03/02/2021   HCT 29.2 (L) 03/02/2021   MCV 99.3 03/02/2021   PLT 202 03/02/2021   Recent Labs    01/26/21 1450 02/06/21 0824 02/16/21 1059 02/27/21 0816 03/01/21 2204 03/02/21 0450  NA 137 136   < > 142 135 134*  K 4.2 3.4*   < > 3.5 3.8 3.1*  CL 101 101   < > 104 98 100  CO2 27 26   < > 28 27 26   GLUCOSE 115* 101*   < > 119* 133* 72  BUN <5* 7   < > 7 6 6   CREATININE 0.49* 0.45*   < > 0.33* 0.55* 0.39*  CALCIUM 9.4 9.1   < > 9.1 9.1 8.4*  GFRNONAA >60 >60   < > >60 >60 >60  PROT 6.4* 6.3*  --  6.9  --   --   ALBUMIN  3.5 3.4*  --  3.9  --   --   AST 26 43*  --  40  --   --   ALT 12 12  --  14  --   --   ALKPHOS 96 98  --  96  --   --   BILITOT 0.1* 0.5  --  0.4  --   --    < > = values in this interval not displayed.    RADIOGRAPHIC STUDIES: I have personally reviewed the  radiological images as listed and agreed with the findings in the report. DG Chest 2 View  Result Date: 03/01/2021 CLINICAL DATA:  Chest pain EXAM: CHEST - 2 VIEW COMPARISON:  12/01/2020, PET CT 02/23/2021 FINDINGS: Right-sided central venous port tip over the SVC. Slight decrease in size of left lower lobe lung masses. Improved aeration left apex. Residual left apical pleural thickening with linear densities. Small moderate left pleural effusion. Stable cardiomediastinal silhouette. No pneumothorax. IMPRESSION: Slight interval improvement in radiographic appearance of left thorax since 12/01/2020 with improved aeration left apex and decreased size of left lower lobe masses. Residual small moderate left pleural effusion Electronically Signed   By: Donavan Foil M.D.   On: 03/01/2021 22:25   CT Angio Chest PE W and/or Wo Contrast  Result Date: 03/02/2021 CLINICAL DATA:  Left lateral chest pain history of lymphoma EXAM: CT ANGIOGRAPHY CHEST WITH CONTRAST TECHNIQUE: Multidetector CT imaging of the chest was performed using the standard protocol during bolus administration of intravenous contrast. Multiplanar CT image reconstructions and MIPs were obtained to evaluate the vascular anatomy. CONTRAST:  76m OMNIPAQUE IOHEXOL 350 MG/ML SOLN COMPARISON:  Chest x-ray 03/01/2021, PET CT 11/18/2020, CT chest 11/25/2000 FINDINGS: Cardiovascular: Satisfactory opacification of the pulmonary arteries to the segmental level. No evidence of pulmonary embolism. Aorta is nonaneurysmal. No dissection is seen. Normal cardiac size. Small pericardial effusion, increased compared with PET CT performed recently, measures up to 14 mm maximum thickness on the right. Mediastinum/Nodes: Midline trachea. No thyroid mass. Extensive bulky mediastinal adenopathy adenopathy is redemonstrated. Bulky subcarinal nodal mass grossly stable in size. Mass effect on the left greater than right bronchi. Esophagus unremarkable. Lungs/Pleura: Small right  and moderate left pleural effusion. Left pleural effusion is loculated and most of the fluid is subpulmonic. Improved aeration of left apex since prior chest CT, with residual consolidation and ground-glass density not significantly changed since interval PET CT earlier this month. Left lower lobe lung mass measuring 8.1 by 4.6 cm, grossly unchanged with recent PET-CT when utilizing similar measurement technique. Central low-density within the mass consistent with necrosis. 2.2 cm left anterior lung base nodule, previously 2.1 cm, grossly unchanged. Upper Abdomen: No acute findings Musculoskeletal: No acute osseous abnormality. Review of the MIP images confirms the above findings. IMPRESSION: 1. Negative for acute pulmonary embolus or aortic dissection. 2. Not much interval change in bulky mediastinal adenopathy since PET CT performed earlier this month. Grossly stable left lower lobe lung masses/nodules. Moderate loculated left pleural effusion with sizable subpulmonic fluid. Aeration at left upper lobe improved since CT from May, not much change since PET CT from earlier this month. Electronically Signed   By: KDonavan FoilM.D.   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 EXAM: NUCLEAR MEDICINE PET SKULL BASE TO THIGH TECHNIQUE: 8.5 mCi F-18 FDG was injected intravenously. Full-ring PET imaging was performed from  the skull base to thigh after the radiotracer. CT data was obtained and used for attenuation correction and anatomic localization. Fasting blood glucose: 96 mg/dl COMPARISON:  FDG PET scan 12/08/2020 FINDINGS: Mediastinal blood pool activity: SUV max 1.4 Liver activity: SUV max 2.2 NECK: hypermetabolic LEFT supraclavicular nodes with SUV max equal 8.9 (image 57) compared SUV max equal 6.0. Hypermetabolic RIGHT supraclavicular nodes additionally. Incidental CT findings: Port in the  anterior chest wall with tip in distal SVC. CHEST: There is persistent bulky intensely hypermetabolic mediastinal adenopathy. For example 2.8 cm prevascular node (image 90) comparison to 2.4 cm with SUV max equal 9.3 compared to SUV max equal 9.0. Bulky subcarinal nodal mass is similar in size with SUV max equal 10.0 compared with SUV max 8.4. Similar perihilar consolidation in the LEFT lower lobe. Rounded mass lesion in the LEFT lower lobe measures 7.1 by 4.2 cm compared with 8.2 by 4.3 cm. This lesion has central low attenuation consistent with necrosis and a rim of metabolic activity with SUV max equal 7.1 compared SUV max equal 4.0. Hypermetabolic pleural thickening along the LEFT oblique fissure measures 2.1 cm compared to 2.3 cm with SUV max equal 8.1 compared to 2.3. Incidental CT findings: none ABDOMEN/PELVIS: No abnormal hypermetabolic activity within the liver, pancreas, adrenal glands, or spleen. No hypermetabolic lymph nodes in the abdomen or pelvis. Incidental CT findings: none SKELETON: No focal hypermetabolic activity to suggest skeletal metastasis. Incidental CT findings: none IMPRESSION: 1. No interval improvement in intensely hypermetabolic bulky mediastinal lymphadenopathy. Some of the nodes masses are increased in size with no decrease in metabolic activity. 2. No significant change in rounded hypermetabolic necrotic mass in the LEFT lower lobe. 3. New hypermetabolic activity associated nodule along the LEFT oblique fissure. 4. No improvement in bilateral hypermetabolic supraclavicular nodes. These results will be called to the ordering clinician or representative by the Radiologist Assistant, and communication documented in the PACS or [employer]. Electronically Signed   By: Suzy Bouchard M.D.   On: 02/24/2021 09:16    ASSESSMENT & PLAN:   Large cell lymphoma of extranodal and solid organ sites [facility]) #Large cell lymphoma-s/p biopsy left supraclavicular lymph node.  Based on PET  scan-given involvement of the lung-stage IV.  FISH-BCL6 gene rearrangement; triple expresser.  Good risk IPI. S/p 3 cycles of R-CHOP- PET scan-AUG 26th, 2022- 1. No interval improvement in intensely hypermetabolic bulky mediastinal lymphadenopathy. Some of the nodes masses are increased in size with no decrease in metabolic activity. No significant change in rounded hypermetabolic necrotic mass in the LEFT lower lobe; New hypermetabolic activity associated nodule along the LEFT oblique fissure. No improvement in bilateral hypermetabolic supraclavicular nodes.    # HOLD chemo today;  Discussed re: salvage chemo- RICE-followed by consolidation autologous stem cell transplant/or CAR-T therapy.  Given patient's preference we will reach out to [place] program for further directions.  We will make a referral.  # SInus Tachycardia: 140 heart rate get STAT EKG.    # hoarseness of voice/likely secondary to mediastinal lymphadenopathy/vocal cord palsy. HOLD off  ENT evaluation given above active issues    # COVID EVUSHELD PROPHYLAXIS: pt missed phone calls.    #Gout/shingles prophylaxis- allopurinol/acyclovir- STABLE.   #Sinus tachycardia-chronic- sec to underlying lymphoma-as per wife patient on metoprolol 200 mg once a day; also recommend Cardizem 240 mg CD once a day.    # DISPOSITION: # HOLD chemo # IVFs- 1lit today # STAT EKG- e; tachyacrdia # referral to [facility]/ Lymphoma program- Diffuse  Large B cell lymphoma ASAP- # follow up TBD-Dr.B  Addendum EKG reviewed: Sinus tachycardia no evidence of any atrial/ventricular arrhythmias.  We will contact patient regarding initiation of salvage therapy.  # I reviewed the blood work- with the patient in detail; also reviewed the imaging independently [as summarized above]; and with the patient in detail.    # 40 minutes face-to-face with the patient discussing the above plan of care; more than 50% of time spent on prognosis/ natural history; counseling  and coordination.    All questions were answered. The patient knows to call the clinic with any problems, questions or concerns.    Cammie Sickle, MD 03/13/2021 11:37 PM

## 2021-02-27 NOTE — Patient Instructions (Signed)
#   Send images of the prescription for metoprolol  #Start Cardizem as ordered.   #Will call in the next 1 to 2 days regarding the next chemotherapy plan

## 2021-02-27 NOTE — Progress Notes (Signed)
Rash x1 week on his back that is itching. New numbness and tingling in hands.

## 2021-02-27 NOTE — Progress Notes (Signed)
DISCONTINUE ON PATHWAY REGIMEN - Lymphoma and CLL     A cycle is every 21 days:     Prednisone      Rituximab-xxxx      Cyclophosphamide      Doxorubicin      Vincristine   **Always confirm dose/schedule in your pharmacy ordering system**  REASON: Disease Progression PRIOR TREATMENT: [id number]: R(IV)-CHOP q21 Days x 6 Cycles TREATMENT RESPONSE: Progressive Disease (PD)  Lymphoma and CLL - No Medical Intervention - Off Treatment.  Patient Characteristics: Diffuse Large B-Cell Lymphoma or Follicular Lymphoma, Grade 3B, Relapsed / Refractory, All Stages,  Second Line, Relapse ? 12 Months From or Refractory to Prior Chemoimmunotherapy, Candidate for CAR T-Cell Therapy, Treating as Specialist or Consulting  with Specialist Disease Type: Not Applicable Disease Type: Diffuse Large B-Cell Lymphoma Disease Type: Not Applicable Line of therapy: Relapsed / Refractory - Second Line Time to Relapse: Refractory to Prior Chemoimmunotherapy Patient Characteristics: Candidate for CAR T-Cell Therapy Please indicate whether you are: Consulting with a specialist

## 2021-02-27 NOTE — Progress Notes (Signed)
DISCONTINUE ON PATHWAY REGIMEN - Lymphoma and CLL  No Medical Intervention - Off Treatment.  REASON: Disease Progression PRIOR TREATMENT: Off Treatment  START OFF PATHWAY REGIMEN - Lymphoma and CLL   [id number]:R-ICE (Ifosfamide D2,3,4) q21 Days (Outpatient):   A cycle is every 21 days:     Rituximab-xxxx      Ifosfamide      Mesna      Mesna      Carboplatin      Etoposide      Pegfilgrastim-xxxx   **Always confirm dose/schedule in your pharmacy ordering system**  Patient Characteristics: Diffuse Large B-Cell Lymphoma or Follicular Lymphoma, Grade 3B, Relapsed / Refractory, All Stages,  Second Line, Relapse ? 12 Months From or Refractory to Prior Chemoimmunotherapy, Candidate for CAR T-Cell Therapy, Treating without Consultation Disease Type: Not Applicable Disease Type: Diffuse Large B-Cell Lymphoma Disease Type: Not Applicable Line of therapy: Relapsed / Refractory - Second Line Time to Relapse: Refractory to Prior Chemoimmunotherapy Patient Characteristics: Candidate for CAR T-Cell Therapy Please indicate whether you are: Treating without a consultation Intent of Therapy: Curative Intent, Discussed with Patient

## 2021-02-28 ENCOUNTER — Encounter: Payer: Self-pay | Admitting: Internal Medicine

## 2021-02-28 ENCOUNTER — Inpatient Hospital Stay: Payer: Medicaid Other

## 2021-03-01 ENCOUNTER — Encounter: Payer: Self-pay | Admitting: *Deleted

## 2021-03-01 ENCOUNTER — Other Ambulatory Visit: Payer: Self-pay

## 2021-03-01 ENCOUNTER — Telehealth: Payer: Self-pay | Admitting: Internal Medicine

## 2021-03-01 ENCOUNTER — Other Ambulatory Visit: Payer: Self-pay | Admitting: *Deleted

## 2021-03-01 ENCOUNTER — Encounter: Payer: Self-pay | Admitting: Internal Medicine

## 2021-03-01 ENCOUNTER — Observation Stay
Admission: EM | Admit: 2021-03-01 | Discharge: 2021-03-02 | Disposition: A | Discharge status: Home / Self Care | Payer: Medicaid Other | Attending: Emergency Medicine | Admitting: Emergency Medicine

## 2021-03-01 ENCOUNTER — Emergency Department: Payer: Medicaid Other

## 2021-03-01 ENCOUNTER — Telehealth: Payer: Self-pay | Admitting: *Deleted

## 2021-03-01 DIAGNOSIS — R079 Chest pain, unspecified: Secondary | ICD-10-CM | POA: Diagnosis present

## 2021-03-01 DIAGNOSIS — Z87891 Personal history of nicotine dependence: Secondary | ICD-10-CM | POA: Diagnosis not present

## 2021-03-01 DIAGNOSIS — J90 Pleural effusion, not elsewhere classified: Principal | ICD-10-CM | POA: Insufficient documentation

## 2021-03-01 DIAGNOSIS — R778 Other specified abnormalities of plasma proteins: Secondary | ICD-10-CM | POA: Insufficient documentation

## 2021-03-01 DIAGNOSIS — J45909 Unspecified asthma, uncomplicated: Secondary | ICD-10-CM | POA: Diagnosis not present

## 2021-03-01 DIAGNOSIS — J449 Chronic obstructive pulmonary disease, unspecified: Secondary | ICD-10-CM | POA: Insufficient documentation

## 2021-03-01 DIAGNOSIS — Z79899 Other long term (current) drug therapy: Secondary | ICD-10-CM | POA: Insufficient documentation

## 2021-03-01 DIAGNOSIS — Z8616 Personal history of COVID-19: Secondary | ICD-10-CM | POA: Insufficient documentation

## 2021-03-01 DIAGNOSIS — Z20822 Contact with and (suspected) exposure to covid-19: Secondary | ICD-10-CM | POA: Diagnosis not present

## 2021-03-01 DIAGNOSIS — R0789 Other chest pain: Secondary | ICD-10-CM | POA: Diagnosis present

## 2021-03-01 HISTORY — DX: Non-Hodgkin lymphoma, unspecified, unspecified site: C85.90

## 2021-03-01 LAB — BASIC METABOLIC PANEL
Anion gap: 10 (ref 5–15)
BUN: 6 mg/dL (ref 6–20)
CO2: 27 mmol/L (ref 22–32)
Calcium: 9.1 mg/dL (ref 8.9–10.3)
Chloride: 98 mmol/L (ref 98–111)
Creatinine, Ser: 0.55 mg/dL — ABNORMAL LOW (ref 0.61–1.24)
GFR, Estimated: >60 mL/min (ref >60)
Glucose, Bld: 133 mg/dL — ABNORMAL HIGH (ref 70–99)
Potassium: 3.8 mmol/L (ref 3.5–5.1)
Sodium: 135 mmol/L (ref 135–145)

## 2021-03-01 LAB — RESP PANEL BY RT-PCR (FLU A&B, COVID) ARPGX2
Influenza A by PCR: NEGATIVE
Influenza B by PCR: NEGATIVE
SARS Coronavirus 2 by RT PCR: NEGATIVE

## 2021-03-01 LAB — CBC
HCT: 34.6 % — ABNORMAL LOW (ref 39.0–52.0)
Hemoglobin: 11.7 g/dL — ABNORMAL LOW (ref 13.0–17.0)
MCH: 33.1 pg (ref 26.0–34.0)
MCHC: 33.8 g/dL (ref 30.0–36.0)
MCV: 98 fL (ref 80.0–100.0)
Platelets: 237 10*3/uL (ref 150–400)
RBC: 3.53 MIL/uL — ABNORMAL LOW (ref 4.22–5.81)
RDW: 19.9 % — ABNORMAL HIGH (ref 11.5–15.5)
WBC: 13.7 10*3/uL — ABNORMAL HIGH (ref 4.0–10.5)
nRBC: 0 % (ref 0.0–0.2)

## 2021-03-01 LAB — TROPONIN I (HIGH SENSITIVITY): Troponin I (High Sensitivity): 24 ng/L — ABNORMAL HIGH (ref <18)

## 2021-03-01 LAB — PROCALCITONIN: Procalcitonin: <0.1 ng/mL

## 2021-03-01 MED ORDER — NITROGLYCERIN IN D5W 200-5 MCG/ML-% IV SOLN
0.0000 ug/min | INTRAVENOUS | Status: DC
  Administered 2021-03-01: 5 ug/min via INTRAVENOUS
  Filled 2021-03-01: qty 250

## 2021-03-01 MED ORDER — ASPIRIN 81 MG PO CHEW
324.0000 mg | CHEWABLE_TABLET | Freq: Once | ORAL | Status: AC
  Administered 2021-03-01: 324 mg via ORAL
  Filled 2021-03-01: qty 4

## 2021-03-01 MED ORDER — IOHEXOL 350 MG/ML SOLN
75.0000 mL | Freq: Once | INTRAVENOUS | Status: AC | PRN
  Administered 2021-03-01: 75 mL via INTRAVENOUS

## 2021-03-01 MED ORDER — NITROGLYCERIN 0.4 MG SL SUBL
0.4000 mg | SUBLINGUAL_TABLET | SUBLINGUAL | Status: DC | PRN
  Administered 2021-03-01: 0.4 mg via SUBLINGUAL
  Filled 2021-03-01: qty 1

## 2021-03-01 NOTE — Telephone Encounter (Signed)
On 8/31-called patient's wife to discuss the treatment plan/discussion with [facility].  However, she prefers me to call back in few minutes.  We will reach out to her again.

## 2021-03-01 NOTE — ED Triage Notes (Signed)
Pt reports that he started having epigastric pain, left lateral chest pain, shortness or breath since yesterday. Hx of lymphoma, last chemo 3 weeks ago.

## 2021-03-01 NOTE — Telephone Encounter (Signed)
Dr. Caprice Beaver agreed to expedite the lymphoma referral to see if patient is a candidate for CART therapy.  Left vm for [facility] Lymphoma referral line at [phone] to confirm that referral was received.

## 2021-03-01 NOTE — Telephone Encounter (Signed)
On 8/31-I had a long discussion with the patient's wife Angie regarding patient's serious situation/guarded prognosis.  Reviewed my conversation with the [place] [facility] doctors regarding plan for CAR-T/pheresis.  Discussed that lack of insurance could be a huge challenge.  We will check with [facility] also.  Discussed option of chemotherapy [RICE]-inpatient/ASAP.  We will call in the next day to confirm the plan.   Recommend they bring the pill bottles next visit.  Informed cancer center staff-regarding upcoming chemotherapy  GB

## 2021-03-02 ENCOUNTER — Other Ambulatory Visit: Payer: Self-pay | Admitting: Internal Medicine

## 2021-03-02 ENCOUNTER — Telehealth: Payer: Self-pay | Admitting: Internal Medicine

## 2021-03-02 ENCOUNTER — Encounter: Payer: Self-pay | Admitting: Internal Medicine

## 2021-03-02 DIAGNOSIS — R0789 Other chest pain: Secondary | ICD-10-CM | POA: Diagnosis present

## 2021-03-02 DIAGNOSIS — I10 Essential (primary) hypertension: Secondary | ICD-10-CM

## 2021-03-02 DIAGNOSIS — J449 Chronic obstructive pulmonary disease, unspecified: Secondary | ICD-10-CM

## 2021-03-02 DIAGNOSIS — R079 Chest pain, unspecified: Secondary | ICD-10-CM | POA: Diagnosis present

## 2021-03-02 LAB — CBC
HCT: 29.2 % — ABNORMAL LOW (ref 39.0–52.0)
Hemoglobin: 10 g/dL — ABNORMAL LOW (ref 13.0–17.0)
MCH: 34 pg (ref 26.0–34.0)
MCHC: 34.2 g/dL (ref 30.0–36.0)
MCV: 99.3 fL (ref 80.0–100.0)
Platelets: 202 10*3/uL (ref 150–400)
RBC: 2.94 MIL/uL — ABNORMAL LOW (ref 4.22–5.81)
RDW: 19.7 % — ABNORMAL HIGH (ref 11.5–15.5)
WBC: 12.3 10*3/uL — ABNORMAL HIGH (ref 4.0–10.5)
nRBC: 0 % (ref 0.0–0.2)

## 2021-03-02 LAB — TROPONIN I (HIGH SENSITIVITY): Troponin I (High Sensitivity): 23 ng/L — ABNORMAL HIGH (ref <18)

## 2021-03-02 LAB — BASIC METABOLIC PANEL
Anion gap: 8 (ref 5–15)
BUN: 6 mg/dL (ref 6–20)
CO2: 26 mmol/L (ref 22–32)
Calcium: 8.4 mg/dL — ABNORMAL LOW (ref 8.9–10.3)
Chloride: 100 mmol/L (ref 98–111)
Creatinine, Ser: 0.39 mg/dL — ABNORMAL LOW (ref 0.61–1.24)
GFR, Estimated: >60 mL/min (ref >60)
Glucose, Bld: 72 mg/dL (ref 70–99)
Potassium: 3.1 mmol/L — ABNORMAL LOW (ref 3.5–5.1)
Sodium: 134 mmol/L — ABNORMAL LOW (ref 135–145)

## 2021-03-02 MED ORDER — METOPROLOL SUCCINATE ER 50 MG PO TB24
200.0000 mg | ORAL_TABLET | Freq: Every day | ORAL | Status: DC
  Administered 2021-03-02: 200 mg via ORAL
  Filled 2021-03-02: qty 4

## 2021-03-02 MED ORDER — HYDROCODONE-ACETAMINOPHEN 5-325 MG PO TABS: 1.0000 | ORAL_TABLET | Freq: Four times a day (QID) | ORAL | Status: DC | PRN

## 2021-03-02 MED ORDER — PANTOPRAZOLE SODIUM 40 MG PO TBEC
40.0000 mg | DELAYED_RELEASE_TABLET | Freq: Every day | ORAL | Status: DC
  Administered 2021-03-02: 40 mg via ORAL
  Filled 2021-03-02: qty 1

## 2021-03-02 MED ORDER — ACYCLOVIR 200 MG PO CAPS
400.0000 mg | ORAL_CAPSULE | Freq: Two times a day (BID) | ORAL | Status: DC
  Administered 2021-03-02 (×2): 400 mg via ORAL
  Filled 2021-03-02 (×3): qty 2

## 2021-03-02 MED ORDER — TRAZODONE HCL 50 MG PO TABS: 25.0000 mg | ORAL_TABLET | Freq: Every evening | ORAL | Status: DC | PRN

## 2021-03-02 MED ORDER — ALBUTEROL SULFATE (2.5 MG/3ML) 0.083% IN NEBU: 2.5000 mg | INHALATION_SOLUTION | RESPIRATORY_TRACT | Status: DC | PRN

## 2021-03-02 MED ORDER — DILTIAZEM HCL ER COATED BEADS 240 MG PO CP24
240.0000 mg | ORAL_CAPSULE | Freq: Every day | ORAL | Status: DC
  Administered 2021-03-02: 240 mg via ORAL
  Filled 2021-03-02: qty 1

## 2021-03-02 MED ORDER — ASPIRIN EC 81 MG PO TBEC
81.0000 mg | DELAYED_RELEASE_TABLET | Freq: Every day | ORAL | Status: DC
  Administered 2021-03-02: 81 mg via ORAL
  Filled 2021-03-02: qty 1

## 2021-03-02 MED ORDER — ENSURE ENLIVE PO LIQD: 237.0000 mL | Freq: Three times a day (TID) | ORAL | Status: DC

## 2021-03-02 MED ORDER — ONDANSETRON HCL 4 MG/2ML IJ SOLN: 4.0000 mg | Freq: Four times a day (QID) | INTRAMUSCULAR | Status: DC | PRN

## 2021-03-02 MED ORDER — VITAMIN D 25 MCG (1000 UNIT) PO TABS
1000.0000 [IU] | ORAL_TABLET | Freq: Every day | ORAL | Status: DC
  Administered 2021-03-02: 1000 [IU] via ORAL
  Filled 2021-03-02: qty 1

## 2021-03-02 MED ORDER — MONTELUKAST SODIUM 10 MG PO TABS
10.0000 mg | ORAL_TABLET | Freq: Every day | ORAL | Status: DC
  Administered 2021-03-02: 10 mg via ORAL
  Filled 2021-03-02: qty 1

## 2021-03-02 MED ORDER — ONDANSETRON HCL 4 MG PO TABS: 4.0000 mg | ORAL_TABLET | Freq: Four times a day (QID) | ORAL | Status: DC | PRN

## 2021-03-02 MED ORDER — KETOROLAC TROMETHAMINE 30 MG/ML IJ SOLN
30.0000 mg | Freq: Once | INTRAMUSCULAR | Status: AC
  Administered 2021-03-02: 30 mg via INTRAVENOUS
  Filled 2021-03-02: qty 1

## 2021-03-02 MED ORDER — LORATADINE 10 MG PO TABS
10.0000 mg | ORAL_TABLET | Freq: Every day | ORAL | Status: DC
  Administered 2021-03-02: 10 mg via ORAL
  Filled 2021-03-02: qty 1

## 2021-03-02 MED ORDER — PROCHLORPERAZINE MALEATE 10 MG PO TABS
10.0000 mg | ORAL_TABLET | Freq: Four times a day (QID) | ORAL | Status: DC | PRN
  Filled 2021-03-02: qty 1

## 2021-03-02 MED ORDER — ACETAMINOPHEN 650 MG RE SUPP: 650.0000 mg | Freq: Four times a day (QID) | RECTAL | Status: DC | PRN

## 2021-03-02 MED ORDER — ADULT MULTIVITAMIN W/MINERALS CH
1.0000 | ORAL_TABLET | Freq: Every day | ORAL | Status: DC
  Administered 2021-03-02: 1 via ORAL
  Filled 2021-03-02: qty 1

## 2021-03-02 MED ORDER — HYDROCOD POLST-CPM POLST ER 10-8 MG/5ML PO SUER: 5.0000 mL | Freq: Two times a day (BID) | ORAL | Status: DC | PRN

## 2021-03-02 MED ORDER — ALLOPURINOL 300 MG PO TABS
300.0000 mg | ORAL_TABLET | Freq: Two times a day (BID) | ORAL | Status: DC
  Administered 2021-03-02 (×2): 300 mg via ORAL
  Filled 2021-03-02 (×3): qty 1

## 2021-03-02 MED ORDER — MAGNESIUM HYDROXIDE 400 MG/5ML PO SUSP: 30.0000 mL | Freq: Every day | ORAL | Status: DC | PRN

## 2021-03-02 MED ORDER — POTASSIUM CHLORIDE CRYS ER 20 MEQ PO TBCR
40.0000 meq | EXTENDED_RELEASE_TABLET | Freq: Once | ORAL | Status: AC
  Administered 2021-03-02: 40 meq via ORAL
  Filled 2021-03-02: qty 2

## 2021-03-02 MED ORDER — ACETAMINOPHEN 325 MG PO TABS: 650.0000 mg | ORAL_TABLET | Freq: Four times a day (QID) | ORAL | Status: DC | PRN

## 2021-03-02 MED ORDER — SODIUM CHLORIDE 0.9 % IV SOLN: INTRAVENOUS | Status: DC

## 2021-03-02 MED ORDER — ENOXAPARIN SODIUM 40 MG/0.4ML IJ SOSY
40.0000 mg | PREFILLED_SYRINGE | INTRAMUSCULAR | Status: DC
  Administered 2021-03-02: 40 mg via SUBCUTANEOUS
  Filled 2021-03-02: qty 0.4

## 2021-03-02 NOTE — ED Notes (Signed)
Pt assisted up to toilet to urinate. Pt requested to order breakfast. Diet order placed per MD request. Pt given menu and phone to order breakfast at this time

## 2021-03-02 NOTE — ED Notes (Signed)
Food tray requested

## 2021-03-02 NOTE — Progress Notes (Signed)
Called for possible STEMI. Patient being treated with chemoRx for lymphoma. Presents with 1-2 day history of chest pain. ECG reveals upscooping ST elevation inferior lateral leads, which was also observed on ECG 02/27/2021. Troponin only 24. Second troponin pending. Since its been at least several hours since initial CP and ECG not clearly diagnostic, it would be reasonable to defer emergent cardiac catheterization until second troponin result is available. Chest pain may be noncardiac due to pericarditis, or pleuritic due to pleural effusion, or pain related to mediastinal adenopathy.

## 2021-03-02 NOTE — H&P (Signed)
[facility]   PATIENT NAME: Curtis Manning    MR#:  [id number]  DATE OF BIRTH:  Dec 10, 1970  DATE OF ADMISSION:  03/01/2021  PRIMARY CARE PHYSICIAN: System, Provider Not In   Patient is coming from: Home  REQUESTING/REFERRING PHYSICIAN: Naaman Plummer, MD  CHIEF COMPLAINT:   Chief Complaint  Patient presents with  . Chest Pain    HISTORY OF PRESENT ILLNESS:  Curtis Sara. is a 50 y.o. Caucasian male male with medical history significant for asthma, lymphoma, lung mass, pleural effusion and atrial fibrillation as well as GERD, who presents emergency room with acute onset of substernal chest pain that felt as somebody punching him, and was graded as 8/10 in severity with associated dyspnea and increased pain with deep breathing and without palpitations.  The patient admits to wheezing without cough or hemoptysis.  No dysuria, oliguria or hematuria or flank pain.  No nausea or vomiting or abdominal pain.    ED Course: When the patient came to the ER heart rate was 113 with otherwise normal vital signs.  Labs revealed high-sensitivity troponin I of 24 and later 23 with procalcitonin less than 0.1 and CBC showed leukocytosis 13.7 with mild anemia.  Influenza antigens and COVID-19 PCR came back negative.   EKG as reviewed by me : Showed sinus tachycardia with rate of 113 with right axis deviation and T wave inversion in V1 and J-point elevation inferiorly and laterally as well as in V3.  EKG was shown to Dr. Saralyn Pilar who thought it could be related to acute pericarditis Imaging: Chest x-ray showed showed slight improvement in radiographic appearance of the left thorax with improved aeration of the left apex and decreased size of the left lower lobe masses with residual small moderate left pleural effusion.  The patient was given 40 of aspirin, 30 mg IV Toradol, 0.4 mg sublingual nitroglycerin and followed by IV nitroglycerin drip.  He will be admitted to a progressive unit  observation bed for further evaluation and management. PAST MEDICAL HISTORY:   Past Medical History:  Diagnosis Date  . A-fib ([place])   . Asthma   . GERD (gastroesophageal reflux disease)   . History of COVID-19   . Lung mass   . Lymphoma ([place])   . Pleural effusion     PAST SURGICAL HISTORY:   Past Surgical History:  Procedure Laterality Date  . IR IMAGING GUIDED PORT INSERTION  12/16/2020    SOCIAL HISTORY:   Social History   Tobacco Use  . Smoking status: Former    Packs/day: 0.50    Years: 20.00    Pack years: 10.00    Types: Cigarettes    Quit date: 11/25/2020    Years since quitting: 0.2  . Smokeless tobacco: Never  Substance Use Topics  . Alcohol use: Yes    Comment: "5 to 6 shots on weekends"    FAMILY HISTORY:   Family History  Problem Relation Age of Onset  . High blood pressure Mother   . Heart Problems Father   . Stomach cancer Paternal Grandmother     DRUG ALLERGIES:  No Known Allergies  REVIEW OF SYSTEMS:   ROS As per history of present illness. All pertinent systems were reviewed above. Constitutional, HEENT, cardiovascular, respiratory, GI, GU, musculoskeletal, neuro, psychiatric, endocrine, integumentary and hematologic systems were reviewed and are otherwise negative/unremarkable except for positive findings mentioned above in the HPI.   MEDICATIONS AT HOME:   Prior to Admission medications  Medication Sig Start Date End Date Taking? Authorizing Provider  acyclovir (ZOVIRAX) 400 MG tablet Take 1 tablet (400 mg total) by mouth 2 (two) times daily.  [to prevent shingles] 12/22/20  Yes Cammie Sickle, MD  albuterol (PROVENTIL) (2.5 MG/3ML) 0.083% nebulizer solution Inhale 3 mLs (2.5 mg total) by nebulization once every 6 (six) hours as needed for wheezing. 02/10/21  Yes   albuterol (VENTOLIN HFA) 108 (90 Base) MCG/ACT inhaler Inhale 1-2 puffs into the lungs every 4 (four) hours as needed for wheezing or shortness of breath. 11/14/20  Yes  Enzo Bi, MD  allopurinol (ZYLOPRIM) 300 MG tablet Take 1 tablet (300 mg total) by mouth 2 (two) times daily. 02/23/21  Yes Cammie Sickle, MD  cholecalciferol (VITAMIN D) 25 MCG tablet Take 1 tablet (1,000 Units total) by mouth daily. Can take any over-the-counter. 11/14/20  Yes Enzo Bi, MD  diltiazem (CARDIZEM CD) 240 MG 24 hr capsule Take 1 capsule (240 mg total) by mouth daily. 02/27/21  Yes Cammie Sickle, MD  diphenhydrAMINE (BENADRYL) 25 MG tablet Take 25 mg by mouth every 6 (six) hours as needed.   Yes [provider]  feeding supplement (ENSURE ENLIVE / ENSURE PLUS) LIQD Take 237 mLs by mouth 3 (three) times daily between meals. 11/14/20  Yes Enzo Bi, MD  guaiFENesin-codeine 100-10 MG/5ML syrup Take 5 mLs by mouth every 6 (six) hours as needed. 01/13/21  Yes [provider]  HYDROcodone-acetaminophen (NORCO/VICODIN) 5-325 MG tablet Take 1 tablet by mouth every 6 (six) hours as needed. 12/14/20  Yes [provider]  loratadine (CLARITIN) 10 MG tablet Take 10 mg by mouth daily.   Yes [provider]  metoprolol (TOPROL-XL) 200 MG 24 hr tablet Take 1 tablet by mouth daily. 01/13/21  Yes [provider]  montelukast (SINGULAIR) 10 MG tablet Take one tablet by mouth daily. Start 2 days prior to infusion as direction by your provider. 01/18/21  Yes Cammie Sickle, MD  Multiple Vitamin (MULTIVITAMIN WITH MINERALS) TABS tablet Take 1 tablet by mouth daily. 11/15/20  Yes Enzo Bi, MD  omeprazole (PRILOSEC) 10 MG capsule Take 20 mg by mouth daily.   Yes [provider]  amoxicillin-clavulanate (AUGMENTIN) 500-125 MG tablet Take 1 tablet (500 mg total) by mouth 2 (two) times daily for 10 days Patient not taking: Reported on 03/01/2021 02/13/21     chlorpheniramine-HYDROcodone (TUSSIONEX) 10-8 MG/5ML SUER Take 5 mLs by mouth every 12 (twelve) hours as needed for cough. Patient not taking: No sig reported 01/20/21   Cammie Sickle, MD  ipratropium-albuterol (DUONEB) 0.5-2.5 (3) MG/3ML SOLN Take 3 mLs by nebulization every 6 (six) hours as needed. 11/14/20   Enzo Bi, MD  lidocaine-prilocaine (EMLA) cream Apply topically as directed 30-45 minutes prior to port access. Patient not taking: Reported on 03/01/2021 01/24/21   Cammie Sickle, MD  methylPREDNISolone (MEDROL DOSEPAK) 4 MG TBPK tablet Follow package directions. Take 6 tablets by mouth once daily on Day 1. Then decrease by one tablet daily until gone. Patient not taking: Reported on 03/01/2021 02/13/21     ondansetron (ZOFRAN) 4 MG tablet Take 2 tablets (8 mg) by mouth every 8 hours as needed for nausea or vomiting 12/22/20   Cammie Sickle, MD  predniSONE (DELTASONE) 20 MG tablet Take 5 tablets (100 mg) by mouth daily for 5 days. Start on the day chemo starts. Patient not taking: Reported on 03/01/2021 12/22/20   Cammie Sickle, MD  prochlorperazine (COMPAZINE) 10  MG tablet Take 1 tablet (10 mg total) by mouth every 6 (six) hours as needed for nausea or vomiting. 12/22/20   Cammie Sickle, MD      VITAL SIGNS:  Blood pressure 112/85, pulse (!) 103, temperature 98.6 F (37 C), temperature source Oral, resp. rate (!) 21, SpO2 94 %.  PHYSICAL EXAMINATION:  Physical Exam  GENERAL:  50 y.o.-year-old Caucasian male patient lying in the bed with no acute distress.  EYES: Pupils equal, round, reactive to light and accommodation. No scleral icterus. Extraocular muscles intact.  HEENT: Head atraumatic, normocephalic. Oropharynx and nasopharynx clear.  NECK:  Supple, no jugular venous distention. No thyroid enlargement, no tenderness.  LUNGS: Normal breath sounds bilaterally, no wheezing, rales,rhonchi or crepitation. No use of accessory muscles of respiration.  CARDIOVASCULAR: Regular rate and rhythm, S1, S2 normal. No murmurs, rubs, or gallops.  ABDOMEN: Soft, nondistended, nontender. Bowel sounds present. No organomegaly or mass.  EXTREMITIES:  No pedal edema, cyanosis, or clubbing.  NEUROLOGIC: Cranial nerves II through XII are intact. Muscle strength 5/5 in all extremities. Sensation intact. Gait not checked.  PSYCHIATRIC: The patient is alert and oriented x 3.  Normal affect and good eye contact. SKIN: No obvious rash, lesion, or ulcer.   LABORATORY PANEL:   CBC Recent Labs  Lab 03/01/21 2204  WBC 13.7*  HGB 11.7*  HCT 34.6*  PLT 237   ------------------------------------------------------------------------------------------------------------------  Chemistries  Recent Labs  Lab 02/27/21 0816 03/01/21 2204  NA 142 135  K 3.5 3.8  CL 104 98  CO2 28 27  GLUCOSE 119* 133*  BUN 7 6  CREATININE 0.33* 0.55*  CALCIUM 9.1 9.1  AST 40  --   ALT 14  --   ALKPHOS 96  --   BILITOT 0.4  --    ------------------------------------------------------------------------------------------------------------------  Cardiac Enzymes No results for input(s): TROPONINI in the last 168 hours. ------------------------------------------------------------------------------------------------------------------  RADIOLOGY:  DG Chest 2 View  Result Date: 03/01/2021 CLINICAL DATA:  Chest pain EXAM: CHEST - 2 VIEW COMPARISON:  12/01/2020, PET CT 02/23/2021 FINDINGS: Right-sided central venous port tip over the SVC. Slight decrease in size of left lower lobe lung masses. Improved aeration left apex. Residual left apical pleural thickening with linear densities. Small moderate left pleural effusion. Stable cardiomediastinal silhouette. No pneumothorax. IMPRESSION: Slight interval improvement in radiographic appearance of left thorax since 12/01/2020 with improved aeration left apex and decreased size of left lower lobe masses. Residual small moderate left pleural effusion Electronically Signed   By: Donavan Foil M.D.   On: 03/01/2021 22:25   CT Angio Chest PE W and/or Wo Contrast  Result Date: 03/02/2021 CLINICAL DATA:  Left lateral chest  pain history of lymphoma EXAM: CT ANGIOGRAPHY CHEST WITH CONTRAST TECHNIQUE: Multidetector CT imaging of the chest was performed using the standard protocol during bolus administration of intravenous contrast. Multiplanar CT image reconstructions and MIPs were obtained to evaluate the vascular anatomy. CONTRAST:  47mL OMNIPAQUE IOHEXOL 350 MG/ML SOLN COMPARISON:  Chest x-ray 03/01/2021, PET CT 11/18/2020, CT chest 11/25/2000 FINDINGS: Cardiovascular: Satisfactory opacification of the pulmonary arteries to the segmental level. No evidence of pulmonary embolism. Aorta is nonaneurysmal. No dissection is seen. Normal cardiac size. Small pericardial effusion, increased compared with PET CT performed recently, measures up to 14 mm maximum thickness on the right. Mediastinum/Nodes: Midline trachea. No thyroid mass. Extensive bulky mediastinal adenopathy adenopathy is redemonstrated. Bulky subcarinal nodal mass grossly stable in size. Mass effect on the left greater than right bronchi. Esophagus unremarkable. Lungs/Pleura:  Small right and moderate left pleural effusion. Left pleural effusion is loculated and most of the fluid is subpulmonic. Improved aeration of left apex since prior chest CT, with residual consolidation and ground-glass density not significantly changed since interval PET CT earlier this month. Left lower lobe lung mass measuring 8.1 by 4.6 cm, grossly unchanged with recent PET-CT when utilizing similar measurement technique. Central low-density within the mass consistent with necrosis. 2.2 cm left anterior lung base nodule, previously 2.1 cm, grossly unchanged. Upper Abdomen: No acute findings Musculoskeletal: No acute osseous abnormality. Review of the MIP images confirms the above findings. IMPRESSION: 1. Negative for acute pulmonary embolus or aortic dissection. 2. Not much interval change in bulky mediastinal adenopathy since PET CT performed earlier this month. Grossly stable left lower lobe lung  masses/nodules. Moderate loculated left pleural effusion with sizable subpulmonic fluid. Aeration at left upper lobe improved since CT from May, not much change since PET CT from earlier this month. Electronically Signed   By: Donavan Foil M.D.   On: 03/02/2021 00:02      IMPRESSION AND PLAN:  Active Problems:   Chest pain  1.  Chest pain, rule out acute coronary syndrome. - The patient is going to be placed in observation on progressive unit bed. - We will follow serial troponin I's. - He will be placed on aspirin as well as apparent sublingual nitroglycerin and morphine sulfate for pain. - Cardiology consult to be obtained. - Dr. Saralyn Pilar was notified about the patient.  2.  COPD without exacerbation. - We will continue his bronchodilator therapy.  3.  Essential hypertension. - We will continue Cardizem CD and Toprol-XL.   DVT prophylaxis: Lovenox. Code Status: full code. Family Communication:  The plan of care was discussed in details with the patient (and family). I answered all questions. The patient agreed to proceed with the above mentioned plan. Further management will depend upon hospital course. Disposition Plan: Back to previous home environment Consults called: Cardiology All the records are reviewed and case discussed with ED provider.  Status is: Observation  The patient remains OBS appropriate and will d/c before 2 midnights.  Dispo: The patient is from: Home              Anticipated d/c is to: Home              Patient currently is not medically stable to d/c.   Difficult to place patient No      TOTAL TIME TAKING CARE OF THIS PATIENT: 55 minutes.    Christel Mormon M.D on 03/02/2021 at 2:34 AM  [facility]   From 7 PM-7 AM, contact night-coverage [email or web address]  CC: Primary care physician; System, Provider Not In

## 2021-03-02 NOTE — Consult Note (Signed)
[facility] Cardiology  CARDIOLOGY CONSULT NOTE  Patient ID: Curtis Manning. MRN: [id number] DOB/AGE: 50-Jun-1972 50 y.o.  Admit date: 03/01/2021 Referring Physician Billie Ruddy Primary Physician  Primary Cardiologist  Reason for Consultation chest pain  HPI: 50 year old gentleman referred for evaluation of chest pain.  The patient has a history of lymphoma, receiving chemotherapy, with left lower lobe lung mass, moderate left pleural effusion, and paroxysmal atrial fibrillation.  On 02/28/2021, the patient developed mid epigastric, sternal discomfort associated with shortness of breath.  Urged by his wife, the patient presented to [facility] ED last evening where ECG revealed sinus tachycardia and nondiagnostic upsloping ST elevations in the inferior lateral leads.  Initial troponin was 24.  I was called to evaluate for possible STEMI.  Did not feel ECG met criteria.  Second troponin was 23.  This morning, patient reports feeling better, with less epigastric and chest discomfort.  Patient reports he had similar symptoms several months ago time stress test was unremarkable.  Review of systems complete and found to be negative unless listed above     Past Medical History:  Diagnosis Date   A-fib ([facility])    Asthma    GERD (gastroesophageal reflux disease)    History of COVID-19    Lung mass    Lymphoma ([facility])    Pleural effusion     Past Surgical History:  Procedure Laterality Date   IR IMAGING GUIDED PORT INSERTION  12/16/2020    (Not in a hospital admission)  Social History   Socioeconomic History   Marital status: Married    Spouse name: Not on file   Number of children: Not on file   Years of education: Not on file   Highest education level: Not on file  Occupational History   Not on file  Tobacco Use   Smoking status: Former    Packs/day: 0.50    Years: 20.00    Pack years: 10.00    Types: Cigarettes    Quit date: 11/25/2020    Years since quitting: 0.2   Smokeless tobacco: Never  Vaping  Use   Vaping Use: Never used  Substance and Sexual Activity   Alcohol use: Yes    Comment: "5 to 6 shots on weekends"   Drug use: Never   Sexual activity: Not on file  Other Topics Concern   Not on file  Social History Narrative   Patient is a active smoker.;  Also liquor.  Lives with his wife in [place].  No children.  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  Lungs: Clear bilaterally to auscultation and percussion. Heart: HRRR . Normal S1 and S2 without gallops or murmurs.  Abdomen: Bowel sounds are positive, abdomen soft and non-tender  Msk:  Back normal, normal gait. Normal strength and tone for age.  Extremities: No clubbing, cyanosis or edema.   Neuro: Alert and oriented X 3. Psych:  Good affect, responds appropriately  Labs:   Lab Results  Component Value Date   WBC 12.3 (H) 03/02/2021   HGB 10.0 (L) 03/02/2021   HCT 29.2 (L) 03/02/2021   MCV 99.3 03/02/2021   PLT 202 03/02/2021    Recent Labs  Lab 02/27/21 0816 03/01/21 2204 03/02/21 0450  NA 142   < > 134*  K 3.5   < > 3.1*  CL 104   < > 100  CO2 28   < > 26  BUN 7   < > 6  CREATININE 0.33*   < > 0.39*  CALCIUM 9.1   < > 8.4*  PROT 6.9  --   --   BILITOT 0.4  --   --   ALKPHOS 96  --   --   ALT 14  --   --   AST 40  --   --   GLUCOSE 119*   < > 72   < > = values in this interval not displayed.    No results found for: CKTOTAL, CKMB, CKMBINDEX, TROPONINI No results found for: CHOL No results found for: HDL No results found for: LDLCALC No results found for: TRIG No results found for: CHOLHDL No results found for: LDLDIRECT    Radiology: DG Chest 2 View  Result Date: 03/01/2021 CLINICAL DATA:  Chest pain EXAM: CHEST - 2 VIEW COMPARISON:  12/01/2020, PET CT 02/23/2021 FINDINGS: Right-sided central venous port tip over the SVC. Slight decrease in size of left lower lobe lung masses. Improved aeration left apex. Residual left apical pleural thickening with linear densities. Small moderate left pleural effusion. Stable cardiomediastinal silhouette. No pneumothorax. IMPRESSION: Slight interval improvement in radiographic appearance of left thorax since 12/01/2020 with improved aeration left apex and decreased size of left lower lobe masses. Residual small moderate left pleural effusion Electronically Signed   By: Donavan Foil M.D.   On: 03/01/2021 22:25   CT Angio Chest PE W and/or Wo Contrast  Result Date: 03/02/2021 CLINICAL DATA:  Left lateral chest pain history of lymphoma EXAM: CT ANGIOGRAPHY CHEST WITH CONTRAST TECHNIQUE: Multidetector CT imaging of the chest was performed using the standard protocol during bolus administration of intravenous contrast. Multiplanar CT image reconstructions and MIPs were obtained to evaluate the vascular anatomy. CONTRAST:  31m OMNIPAQUE IOHEXOL 350 MG/ML SOLN COMPARISON:  Chest x-ray 03/01/2021, PET CT 11/18/2020, CT chest 11/25/2000 FINDINGS: Cardiovascular: Satisfactory opacification of the pulmonary arteries to the segmental level. No evidence of pulmonary embolism. Aorta is nonaneurysmal. No dissection is seen. Normal cardiac size. Small pericardial effusion, increased compared with PET CT performed recently, measures up to 14 mm maximum thickness on the right. Mediastinum/Nodes: Midline trachea. No thyroid mass. Extensive bulky mediastinal adenopathy  adenopathy is redemonstrated. Bulky subcarinal nodal mass grossly stable in size. Mass effect on the left greater than right bronchi. Esophagus unremarkable. Lungs/Pleura: Small right and moderate left pleural effusion. Left pleural effusion is loculated and most of the fluid is subpulmonic. Improved aeration of left apex since prior chest CT, with residual consolidation and ground-glass density not significantly changed since interval PET CT earlier this month. Left lower lobe lung mass measuring 8.1 by 4.6 cm, grossly unchanged with recent PET-CT when utilizing similar measurement technique. Central low-density within the mass consistent with necrosis. 2.2 cm left anterior lung base nodule, previously 2.1 cm, grossly unchanged. Upper Abdomen: No acute findings Musculoskeletal: No acute  osseous abnormality. Review of the MIP images confirms the above findings. IMPRESSION: 1. Negative for acute pulmonary embolus or aortic dissection. 2. Not much interval change in bulky mediastinal adenopathy since PET CT performed earlier this month. Grossly stable left lower lobe lung masses/nodules. Moderate loculated left pleural effusion with sizable subpulmonic fluid. Aeration at left upper lobe improved since CT from May, not much change since PET CT from earlier this month. Electronically Signed   By: Donavan Foil M.D.   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 EXAM: NUCLEAR MEDICINE PET SKULL BASE TO THIGH TECHNIQUE: 8.5 mCi F-18 FDG was injected intravenously. Full-ring PET imaging was performed from the skull base to thigh after the radiotracer. CT data was obtained and used for attenuation correction and anatomic localization. Fasting blood glucose: 96 mg/dl COMPARISON:  FDG PET scan 12/08/2020 FINDINGS: Mediastinal blood pool activity: SUV max 1.4 Liver activity: SUV max 2.2 NECK:  hypermetabolic LEFT supraclavicular nodes with SUV max equal 8.9 (image 57) compared SUV max equal 6.0. Hypermetabolic RIGHT supraclavicular nodes additionally. Incidental CT findings: Port in the anterior chest wall with tip in distal SVC. CHEST: There is persistent bulky intensely hypermetabolic mediastinal adenopathy. For example 2.8 cm prevascular node (image 90) comparison to 2.4 cm with SUV max equal 9.3 compared to SUV max equal 9.0. Bulky subcarinal nodal mass is similar in size with SUV max equal 10.0 compared with SUV max 8.4. Similar perihilar consolidation in the LEFT lower lobe. Rounded mass lesion in the LEFT lower lobe measures 7.1 by 4.2 cm compared with 8.2 by 4.3 cm. This lesion has central low attenuation consistent with necrosis and a rim of metabolic activity with SUV max equal 7.1 compared SUV max equal 4.0. Hypermetabolic pleural thickening along the LEFT oblique fissure measures 2.1 cm compared to 2.3 cm with SUV max equal 8.1 compared to 2.3. Incidental CT findings: none ABDOMEN/PELVIS: No abnormal hypermetabolic activity within the liver, pancreas, adrenal glands, or spleen. No hypermetabolic lymph nodes in the abdomen or pelvis. Incidental CT findings: none SKELETON: No focal hypermetabolic activity to suggest skeletal metastasis. Incidental CT findings: none IMPRESSION: 1. No interval improvement in intensely hypermetabolic bulky mediastinal lymphadenopathy. Some of the nodes masses are increased in size with no decrease in metabolic activity. 2. No significant change in rounded hypermetabolic necrotic mass in the LEFT lower lobe. 3. New hypermetabolic activity associated nodule along the LEFT oblique fissure. 4. No improvement in bilateral hypermetabolic supraclavicular nodes. These results will be called to the ordering clinician or representative by the Radiologist Assistant, and communication documented in the PACS or [employer]. Electronically Signed   By: Suzy Bouchard M.D.    On: 02/24/2021 09:16    EKG: Sinus tachycardia with nondiagnostic up scooping ST elevations in inferior and lateral leads  ASSESSMENT AND PLAN:   Atypical chest pain in patient with lymphoma, left lower lung mass, with nondiagnostic ECG, and trivial elevated high sensitivity troponin without delta ( 24,23 ), improved this morning, unlikely due to acute coronary syndrome, not consistent with acute pericarditis Paroxysmal atrial fibrillation, CHA2DS2-VASc score of 1, currently in sinus rhythm, not on chronic anticoagulation, underlying lymphoma and left lower lobe lung mass Essential hypertension on metoprolol succinate and Cardizem CD Lymphoma, on chemotherapy  Recommendations  1.  Agree with current therapy 2.  DC nitroglycerin drip 3.  Review 2D echocardiogram  Signed: Isaias Cowman  MD,PhD, [facility] 03/02/2021, 8:04 AM

## 2021-03-02 NOTE — ED Provider Notes (Signed)
[facility] Emergency Department Provider Note   ____________________________________________   Event Date/Time   First MD Initiated Contact with Patient 03/01/21 2258     (approximate)  I have reviewed the triage vital signs and the nursing notes.   HISTORY  Chief Complaint Chest Pain    HPI Curtis Manning. is a 50 y.o. male who presents for left-sided chest pain  LOCATION: Left chest DURATION: 1 day prior to arrival TIMING: Stable since onset SEVERITY: 8/10 QUALITY: Sharp/pressure pain CONTEXT: Patient states that he began having sharp pain in his left chest with associated chest pressure as well as epigastric pain that began last night and has been stable since onset therefore patient presented to the emergency room for evaluation today. 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 Authorizing Provider  acyclovir (ZOVIRAX) 400 MG tablet Take 1 tablet (400 mg total) by mouth 2 (two) times daily.  [to prevent shingles] 12/22/20  Yes Cammie Sickle, MD  albuterol (PROVENTIL) (2.5 MG/3ML) 0.083% nebulizer solution Inhale 3 mLs (2.5 mg total) by nebulization once every 6 (six) hours as needed for wheezing. 02/10/21  Yes   albuterol (VENTOLIN HFA) 108 (90 Base) MCG/ACT inhaler Inhale 1-2 puffs into the lungs every 4 (four) hours as needed for wheezing or shortness of breath. 11/14/20  Yes Enzo Bi, MD  allopurinol (ZYLOPRIM) 300 MG tablet Take 1 tablet (300 mg total) by mouth 2 (two) times daily. 02/23/21  Yes Cammie Sickle, MD  cholecalciferol (VITAMIN D) 25 MCG tablet Take 1 tablet (1,000 Units total) by mouth daily. Can take any over-the-counter. 11/14/20  Yes Enzo Bi, MD  diltiazem (CARDIZEM CD) 240 MG 24 hr capsule Take 1 capsule (240 mg total) by mouth daily. 02/27/21  Yes Cammie Sickle, MD  diphenhydrAMINE (BENADRYL) 25 MG tablet Take 25 mg by mouth every 6 (six) hours as needed.   Yes [provider]  feeding supplement (ENSURE ENLIVE / ENSURE PLUS) LIQD Take 237 mLs by mouth 3 (three) times daily between meals. 11/14/20  Yes Enzo Bi, MD  guaiFENesin-codeine 100-10 MG/5ML syrup Take 5 mLs by mouth every 6 (six) hours as needed. 01/13/21  Yes [provider]  HYDROcodone-acetaminophen (NORCO/VICODIN) 5-325 MG tablet Take 1 tablet by mouth every 6 (six) hours as needed. 12/14/20  Yes [provider]  loratadine (CLARITIN) 10 MG tablet Take 10 mg by mouth daily.   Yes [provider]  metoprolol (TOPROL-XL) 200 MG 24 hr tablet Take 1 tablet by mouth daily. 01/13/21  Yes [provider]  montelukast (SINGULAIR) 10 MG tablet Take one tablet by mouth daily. Start 2 days prior to infusion as direction by your  provider. 01/18/21  Yes Cammie Sickle, MD  Multiple Vitamin (MULTIVITAMIN WITH MINERALS) TABS tablet Take 1 tablet by mouth daily. 11/15/20  Yes Enzo Bi, MD  omeprazole (PRILOSEC) 10 MG capsule Take 20 mg by mouth daily.   Yes [provider]  amoxicillin-clavulanate (AUGMENTIN) 500-125 MG tablet Take 1 tablet (500 mg total) by mouth 2 (two) times daily for 10 days Patient not taking: Reported on 03/01/2021 02/13/21     chlorpheniramine-HYDROcodone (TUSSIONEX) 10-8 MG/5ML SUER Take 5 mLs by mouth every 12 (twelve) hours as needed for cough. Patient not taking: No sig reported 01/20/21   Cammie Sickle, MD  ipratropium-albuterol (DUONEB) 0.5-2.5 (3) MG/3ML SOLN Take 3 mLs by nebulization every 6 (six) hours as needed. 11/14/20   Enzo Bi, MD  lidocaine-prilocaine (EMLA) cream Apply topically as directed 30-45 minutes prior to port access. Patient not taking: Reported on 03/01/2021 01/24/21   Cammie Sickle, MD  methylPREDNISolone (MEDROL DOSEPAK) 4 MG TBPK tablet Follow package directions. Take 6 tablets by mouth once daily on Day 1. Then decrease by one tablet daily until gone. Patient not taking: Reported on 03/01/2021 02/13/21     ondansetron (ZOFRAN) 4 MG tablet Take 2 tablets (8 mg) by mouth every 8 hours as needed for nausea or vomiting 12/22/20   Cammie Sickle, MD  predniSONE (DELTASONE) 20 MG tablet Take 5 tablets (100 mg) by mouth daily for 5 days. Start on the day chemo starts. Patient not taking: Reported on 03/01/2021 12/22/20   Cammie Sickle, MD  prochlorperazine (COMPAZINE) 10 MG tablet Take 1 tablet (10 mg total) by mouth every 6 (six) hours as needed for nausea or vomiting. 12/22/20   Cammie Sickle, MD    Allergies Patient has no known allergies.  Family History  Problem Relation Age of Onset   High blood pressure Mother    Heart Problems Father    Stomach cancer Paternal Grandmother     Social History Social History   Tobacco  Use   Smoking status: Former    Packs/day: 0.50    Years: 20.00    Pack years: 10.00    Types: Cigarettes    Quit date: 11/25/2020    Years since quitting: 0.2   Smokeless tobacco: Never  Vaping Use   Vaping Use: Never used  Substance Use Topics   Alcohol use: Yes    Comment: "5 to 6 shots on weekends"   Drug use: Never    Review of Systems Constitutional: No fever/chills Eyes: No visual changes. ENT: No sore throat. Cardiovascular: Endorses chest pain. Respiratory: Endorses shortness of breath. Gastrointestinal: No abdominal pain.  No nausea, no vomiting.  No diarrhea. Genitourinary: Negative for dysuria. Musculoskeletal: Negative for acute arthralgias Skin: Negative for rash. Neurological: Negative for headaches, weakness/numbness/paresthesias in any extremity Psychiatric: Negative for suicidal ideation/homicidal ideation   ____________________________________________   PHYSICAL EXAM:  VITAL SIGNS: ED Triage Vitals [03/01/21 2156]  Enc Vitals Group     BP 113/73     Pulse  Rate (!) 113     Resp 20     Temp 98.6 F (37 C)     Temp Source Oral     SpO2 98 %     Weight      Height      Head Circumference      Peak Flow      Pain Score 8     Pain Loc      Pain Edu?      Excl. in [place]?    Constitutional: Alert and oriented. Well appearing and in no acute distress. Eyes: Conjunctivae are normal. PERRL. Head: Atraumatic. Nose: No congestion/rhinnorhea. Mouth/Throat: Mucous membranes are moist. Neck: No stridor Cardiovascular: Grossly normal heart sounds.  Good peripheral circulation. Respiratory: Normal respiratory effort.  No retractions.  Decreased breath sounds over left lower lung fields Gastrointestinal: Soft and nontender. No distention. Musculoskeletal: No obvious deformities Neurologic:  Normal speech and language. No gross focal neurologic deficits are appreciated. Skin:  Skin is warm and dry. No rash noted. Psychiatric: Mood and affect are normal.  Speech and behavior are normal.  ____________________________________________   LABS (all labs ordered are listed, but only abnormal results are displayed)  Labs Reviewed  BASIC METABOLIC PANEL - Abnormal; Notable for the following components:      Result Value   Glucose, Bld 133 (*)    Creatinine, Ser 0.55 (*)    All other components within normal limits  CBC - Abnormal; Notable for the following components:   WBC 13.7 (*)    RBC 3.53 (*)    Hemoglobin 11.7 (*)    HCT 34.6 (*)    RDW 19.9 (*)    All other components within normal limits  TROPONIN I (HIGH SENSITIVITY) - Abnormal; Notable for the following components:   Troponin I (High Sensitivity) 24 (*)    All other components within normal limits  TROPONIN I (HIGH SENSITIVITY) - Abnormal; Notable for the following components:   Troponin I (High Sensitivity) 23 (*)    All other components within normal limits  RESP PANEL BY RT-PCR (FLU A&B, COVID) ARPGX2  PROCALCITONIN  BASIC METABOLIC PANEL  CBC   ____________________________________________  EKG  ED ECG REPORT I, Naaman Plummer, the attending physician, personally viewed and interpreted this ECG.  Date: 03/02/2021 EKG Time: 2159 Rate: 113 Rhythm: Tachycardic sinus rhythm QRS Axis: normal Intervals: normal ST/T Wave abnormalities: normal Narrative Interpretation: no evidence of acute ischemia  ____________________________________________  RADIOLOGY  ED MD interpretation: 2 view chest x-ray shows no evidence of acute abnormalities including no pneumonia, pneumothorax, or widened mediastinum.  Slight interval improvement of left thorax with improved aeration of the left apex  CT angiography of the chest negative for any PE and shows bulky adenopathy in the chest as well as loculated left pleural effusion  Official radiology report(s): DG Chest 2 View  Result Date: 03/01/2021 CLINICAL DATA:  Chest pain EXAM: CHEST - 2 VIEW COMPARISON:  12/01/2020, PET CT  02/23/2021 FINDINGS: Right-sided central venous port tip over the SVC. Slight decrease in size of left lower lobe lung masses. Improved aeration left apex. Residual left apical pleural thickening with linear densities. Small moderate left pleural effusion. Stable cardiomediastinal silhouette. No pneumothorax. IMPRESSION: Slight interval improvement in radiographic appearance of left thorax since 12/01/2020 with improved aeration left apex and decreased size of left lower lobe masses. Residual small moderate left pleural effusion Electronically Signed   By: Donavan Foil M.D.   On: 03/01/2021 22:25   CT  Angio Chest PE W and/or Wo Contrast  Result Date: 03/02/2021 CLINICAL DATA:  Left lateral chest pain history of lymphoma EXAM: CT ANGIOGRAPHY CHEST WITH CONTRAST TECHNIQUE: Multidetector CT imaging of the chest was performed using the standard protocol during bolus administration of intravenous contrast. Multiplanar CT image reconstructions and MIPs were obtained to evaluate the vascular anatomy. CONTRAST:  82m OMNIPAQUE IOHEXOL 350 MG/ML SOLN COMPARISON:  Chest x-ray 03/01/2021, PET CT 11/18/2020, CT chest 11/25/2000 FINDINGS: Cardiovascular: Satisfactory opacification of the pulmonary arteries to the segmental level. No evidence of pulmonary embolism. Aorta is nonaneurysmal. No dissection is seen. Normal cardiac size. Small pericardial effusion, increased compared with PET CT performed recently, measures up to 14 mm maximum thickness on the right. Mediastinum/Nodes: Midline trachea. No thyroid mass. Extensive bulky mediastinal adenopathy adenopathy is redemonstrated. Bulky subcarinal nodal mass grossly stable in size. Mass effect on the left greater than right bronchi. Esophagus unremarkable. Lungs/Pleura: Small right and moderate left pleural effusion. Left pleural effusion is loculated and most of the fluid is subpulmonic. Improved aeration of left apex since prior chest CT, with residual consolidation and  ground-glass density not significantly changed since interval PET CT earlier this month. Left lower lobe lung mass measuring 8.1 by 4.6 cm, grossly unchanged with recent PET-CT when utilizing similar measurement technique. Central low-density within the mass consistent with necrosis. 2.2 cm left anterior lung base nodule, previously 2.1 cm, grossly unchanged. Upper Abdomen: No acute findings Musculoskeletal: No acute osseous abnormality. Review of the MIP images confirms the above findings. IMPRESSION: 1. Negative for acute pulmonary embolus or aortic dissection. 2. Not much interval change in bulky mediastinal adenopathy since PET CT performed earlier this month. Grossly stable left lower lobe lung masses/nodules. Moderate loculated left pleural effusion with sizable subpulmonic fluid. Aeration at left upper lobe improved since CT from May, not much change since PET CT from earlier this month. Electronically Signed   By: KDonavan FoilM.D.   On: 03/02/2021 00:02    ____________________________________________   PROCEDURES  Procedure(s) performed (including Critical Care):  .1-3 Lead EKG Interpretation  Date/Time: 03/02/2021 4:27 AM Performed by: BNaaman Plummer MD Authorized by: BNaaman Plummer MD     Interpretation: abnormal     ECG rate:  105   ECG rate assessment: tachycardic     Rhythm: sinus tachycardia     Ectopy: none     Conduction: normal     ____________________________________________   INITIAL IMPRESSION / ASSESSMENT AND PLAN / ED COURSE  As part of my medical decision making, I reviewed the following data within the electronic medical record, if available:  Nursing notes reviewed and incorporated, Labs reviewed, EKG interpreted, Old chart reviewed, Radiograph reviewed and Notes from prior ED visits reviewed and incorporated        Workup: ECG, CXR, CBC, BMP, Troponin Findings: ECG: No overt evidence of STEMI. No evidence of Brugadas sign, delta wave, epsilon wave,  significantly prolonged QTc, or malignant arrhythmia HS Troponin: Negative x1 Other Labs unremarkable for emergent problems. CXR: Without PTX, PNA, or widened mediastinum Last Stress Test: Never Last Heart Catheterization: Never HEART Score: 5  Given History, Exam, and Workup I have low suspicion for ACS, Pneumothorax, Pneumonia, Pulmonary Embolus, Tamponade, Aortic Dissection or other emergent problem as a cause for this presentation.   Reassesment: Prior to discharge patients pain was controlled and they were well appearing. Consult: Spoke with Dr. PSaralyn Pilarand cardiology who upon examining the EKG as well as the history and only slightly  elevated troponin did not feel that patient met STEMI criteria at this time.  I agree with his assessment. Disposition: Admit to medicin      ____________________________________________   FINAL CLINICAL IMPRESSION(S) / ED DIAGNOSES  Final diagnoses:  Other chest pain  Elevated troponin  Recurrent left pleural effusion     ED Discharge Orders     None        Note:  This document was prepared using Dragon voice recognition software and may include unintentional dictation errors.    Naaman Plummer, MD 03/02/21 [phone]

## 2021-03-02 NOTE — Discharge Instructions (Signed)
Your cardiologist Dr. Humphrey Rolls wants you to go to his clinic office tomorrow 03/03/21 for CTA coronaries, which can't be done in the hospital.  If CTA showed your coronaries look ok, then your chest pain is likely due to the lymphoma and lung mass.  In that case, Dr. Rogue Bussing recommended that next time you have chest pain, to go straight to [facility] ED so they will admit you for further management of your lymphoma.    Dr. Enzo Bi

## 2021-03-02 NOTE — Telephone Encounter (Signed)
On 09/01-I spoke to patient's wife-patient in the ER for cardiac pain.  Patient to be discharged for outpatient work-up with cardiology.  Discussed with the wife-the overall seriousness of the situation ["life and death"]-and the difficulty in trying to get him to [facility] given lack of insurance etc.  However patient has reported chest pain recommend to go to [facility] or [facility] for expedited evaluation. --------  Also discussed with Dr. Billie Ruddy. Evaluated by cardiology-awaiting CTA with Dr. Humphrey Rolls outpatient on 09/02-given abnormal EKG.   ---------------  For now-hold off starting RICE chemo-awaiting above evaluation/cardiac clearance.

## 2021-03-02 NOTE — Discharge Summary (Signed)
Physician Discharge Summary   Curtis Manning.  male DOB: 04-06-1971  TIR:[id number]  PCP: System, Provider Not In  Admit date: 03/01/2021 Discharge date: 03/02/2021  Admitted From: home Disposition:  home. Wife updated at bedside about discharge plans.  CODE STATUS: Full code  Discharge Instructions     Discharge instructions   Complete by: As directed    Your cardiologist Dr. Humphrey Rolls wants you to go to his clinic office tomorrow 03/03/21 for CTA coronaries, which can't be done in the hospital.  If CTA showed your coronaries look ok, then your chest pain is likely due to the lymphoma and lung mass.  In that case, Dr. Rogue Bussing recommended that next time you have chest pain, to go straight to [facility] ED so they will admit you for further management of your lymphoma.   Dr. Enzo Bi [facility] Course:  For full details, please see H&P, progress notes, consult notes and ancillary notes.  Briefly,  Curtis Pyo. is a 51 y.o. Caucasian male male with medical history significant for asthma, lymphoma, lung mass, pleural effusion and atrial fibrillation, who presented with acute onset of substernal chest pain that felt as somebody punching him, and was graded as 8/10 in severity with associated dyspnea and increased pain with deep breathing and without palpitations.   Chest pain, ACS ruled out - trop 24 and 23.   - Dr. Saralyn Pilar saw pt initially, deemed ECG nondiagnostic and ruled out ACS.  Care transferred to Dr. Humphrey Rolls after we learned that pt follows with Dr. Humphrey Rolls. --Patient had nuclear medicine stress test in office on 12/12/20, results equivocal.  Dr. Humphrey Rolls rec CTA coronaries in his office tomorrow 03/03/21.  Lymphoma and lung mass --if coronaries are clear, then chest pain is likely due to lung mass compressing.  Oncology Dr. Rogue Bussing rec that pt goes to [facility] ED for expedited evaluation and treatment next time when pt has chest pain, since our hospital does not offer the  lymphoma treatment that pt needs.  COPD without exacerbation. - continue his bronchodilator therapy.  Essential hypertension. - continue Cardizem CD and Toprol-XL.   Discharge Diagnoses:  Active Problems:   Chest pain   Atypical chest pain     Discharge Instructions:  Allergies as of 03/02/2021   No Known Allergies      Medication List     STOP taking these medications    amoxicillin-clavulanate 500-125 MG tablet Commonly known as: AUGMENTIN   chlorpheniramine-HYDROcodone 10-8 MG/5ML Suer Commonly known as: TUSSIONEX   lidocaine-prilocaine cream Commonly known as: EMLA   methylPREDNISolone 4 MG Tbpk tablet Commonly known as: MEDROL DOSEPAK   predniSONE 20 MG tablet Commonly known as: DELTASONE       TAKE these medications    acyclovir 400 MG tablet Commonly known as: ZOVIRAX Take 1 tablet (400 mg total) by mouth 2 (two) times daily.  [to prevent shingles]   albuterol 108 (90 Base) MCG/ACT inhaler Commonly known as: VENTOLIN HFA Inhale 1-2 puffs into the lungs every 4 (four) hours as needed for wheezing or shortness of breath.   albuterol (2.5 MG/3ML) 0.083% nebulizer solution Commonly known as: PROVENTIL Inhale 3 mLs (2.5 mg total) by nebulization once every 6 (six) hours as needed for wheezing.   allopurinol 300 MG tablet Commonly known as: ZYLOPRIM Take 1 tablet (300 mg total) by mouth 2 (two) times daily.   diltiazem 240 MG 24 hr capsule Commonly known as: CARDIZEM CD Take  1 capsule (240 mg total) by mouth daily.   diphenhydrAMINE 25 MG tablet Commonly known as: BENADRYL Take 25 mg by mouth every 6 (six) hours as needed.   feeding supplement Liqd Take 237 mLs by mouth 3 (three) times daily between meals.   guaiFENesin-codeine 100-10 MG/5ML syrup Take 5 mLs by mouth every 6 (six) hours as needed.   HYDROcodone-acetaminophen 5-325 MG tablet Commonly known as: NORCO/VICODIN Take 1 tablet by mouth every 6 (six) hours as needed.    ipratropium-albuterol 0.5-2.5 (3) MG/3ML Soln Commonly known as: DUONEB Take 3 mLs by nebulization every 6 (six) hours as needed.   loratadine 10 MG tablet Commonly known as: CLARITIN Take 10 mg by mouth daily.   metoprolol 200 MG 24 hr tablet Commonly known as: TOPROL-XL Take 1 tablet by mouth daily.   montelukast 10 MG tablet Commonly known as: Singulair Take one tablet by mouth daily. Start 2 days prior to infusion as direction by your provider.   multivitamin with minerals Tabs tablet Take 1 tablet by mouth daily.   omeprazole 10 MG capsule Commonly known as: PRILOSEC Take 20 mg by mouth daily.   ondansetron 4 MG tablet Commonly known as: ZOFRAN Take 2 tablets (8 mg) by mouth every 8 hours as needed for nausea or vomiting   prochlorperazine 10 MG tablet Commonly known as: COMPAZINE Take 1 tablet (10 mg total) by mouth every 6 (six) hours as needed for nausea or vomiting.   Vitamin D3 25 MCG tablet Commonly known as: Vitamin D Take 1 tablet (1,000 Units total) by mouth daily. Can take any over-the-counter.         Follow-up Information     Dionisio David, MD Follow up on 03/03/2021.   Specialty: Cardiology Why: CTA coronaries. Contact information: [place] [address] [phone]                 No Known Allergies   The results of significant diagnostics from this hospitalization (including imaging, microbiology, ancillary and laboratory) are listed below for reference.   Consultations:   Procedures/Studies: DG Chest 2 View  Result Date: 03/01/2021 CLINICAL DATA:  Chest pain EXAM: CHEST - 2 VIEW COMPARISON:  12/01/2020, PET CT 02/23/2021 FINDINGS: Right-sided central venous port tip over the SVC. Slight decrease in size of left lower lobe lung masses. Improved aeration left apex. Residual left apical pleural thickening with linear densities. Small moderate left pleural effusion. Stable cardiomediastinal silhouette. No  pneumothorax. IMPRESSION: Slight interval improvement in radiographic appearance of left thorax since 12/01/2020 with improved aeration left apex and decreased size of left lower lobe masses. Residual small moderate left pleural effusion Electronically Signed   By: Donavan Foil M.D.   On: 03/01/2021 22:25   CT Angio Chest PE W and/or Wo Contrast  Result Date: 03/02/2021 CLINICAL DATA:  Left lateral chest pain history of lymphoma EXAM: CT ANGIOGRAPHY CHEST WITH CONTRAST TECHNIQUE: Multidetector CT imaging of the chest was performed using the standard protocol during bolus administration of intravenous contrast. Multiplanar CT image reconstructions and MIPs were obtained to evaluate the vascular anatomy. CONTRAST:  71mL OMNIPAQUE IOHEXOL 350 MG/ML SOLN COMPARISON:  Chest x-ray 03/01/2021, PET CT 11/18/2020, CT chest 11/25/2000 FINDINGS: Cardiovascular: Satisfactory opacification of the pulmonary arteries to the segmental level. No evidence of pulmonary embolism. Aorta is nonaneurysmal. No dissection is seen. Normal cardiac size. Small pericardial effusion, increased compared with PET CT performed recently, measures up to 14 mm maximum thickness on the right. Mediastinum/Nodes: Midline  trachea. No thyroid mass. Extensive bulky mediastinal adenopathy adenopathy is redemonstrated. Bulky subcarinal nodal mass grossly stable in size. Mass effect on the left greater than right bronchi. Esophagus unremarkable. Lungs/Pleura: Small right and moderate left pleural effusion. Left pleural effusion is loculated and most of the fluid is subpulmonic. Improved aeration of left apex since prior chest CT, with residual consolidation and ground-glass density not significantly changed since interval PET CT earlier this month. Left lower lobe lung mass measuring 8.1 by 4.6 cm, grossly unchanged with recent PET-CT when utilizing similar measurement technique. Central low-density within the mass consistent with necrosis. 2.2 cm left  anterior lung base nodule, previously 2.1 cm, grossly unchanged. Upper Abdomen: No acute findings Musculoskeletal: No acute osseous abnormality. Review of the MIP images confirms the above findings. IMPRESSION: 1. Negative for acute pulmonary embolus or aortic dissection. 2. Not much interval change in bulky mediastinal adenopathy since PET CT performed earlier this month. Grossly stable left lower lobe lung masses/nodules. Moderate loculated left pleural effusion with sizable subpulmonic fluid. Aeration at left upper lobe improved since CT from May, not much change since PET CT from earlier this month. Electronically Signed   By: Donavan Foil M.D.   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 EXAM: NUCLEAR MEDICINE PET SKULL BASE TO THIGH TECHNIQUE: 8.5 mCi F-18 FDG was injected intravenously. Full-ring PET imaging was performed from the skull base to thigh after the radiotracer. CT data was obtained and used for attenuation correction and anatomic localization. Fasting blood glucose: 96 mg/dl COMPARISON:  FDG PET scan 12/08/2020 FINDINGS: Mediastinal blood pool activity: SUV max 1.4 Liver activity: SUV max 2.2 NECK: hypermetabolic LEFT supraclavicular nodes with SUV max equal 8.9 (image 57) compared SUV max equal 6.0. Hypermetabolic RIGHT supraclavicular nodes additionally. Incidental CT findings: Port in the anterior chest wall with tip in distal SVC. CHEST: There is persistent bulky intensely hypermetabolic mediastinal adenopathy. For example 2.8 cm prevascular node (image 90) comparison to 2.4 cm with SUV max equal 9.3 compared to SUV max equal 9.0. Bulky subcarinal nodal mass is similar in size with SUV max equal 10.0 compared with SUV max 8.4. Similar perihilar consolidation in the LEFT lower lobe. Rounded mass lesion in the LEFT lower lobe measures 7.1 by 4.2 cm compared with 8.2 by  4.3 cm. This lesion has central low attenuation consistent with necrosis and a rim of metabolic activity with SUV max equal 7.1 compared SUV max equal 4.0. Hypermetabolic pleural thickening along the LEFT oblique fissure measures 2.1 cm compared to 2.3 cm with SUV max equal 8.1 compared to 2.3. Incidental CT findings: none ABDOMEN/PELVIS: No abnormal hypermetabolic activity within the liver, pancreas, adrenal glands, or spleen. No hypermetabolic lymph nodes in the abdomen or pelvis. Incidental CT findings: none SKELETON: No focal hypermetabolic activity to suggest skeletal metastasis. Incidental CT findings: none IMPRESSION: 1. No interval improvement in intensely hypermetabolic bulky mediastinal lymphadenopathy. Some of the nodes masses are increased in size with no decrease in metabolic activity. 2. No significant change in rounded hypermetabolic necrotic mass in the LEFT lower lobe. 3. New hypermetabolic activity associated nodule along the LEFT oblique fissure. 4. No improvement in bilateral hypermetabolic supraclavicular nodes. These results will be called to the ordering clinician or representative by the Radiologist Assistant, and communication documented in the PACS or [employer]. Electronically Signed   By: Helane Gunther.D.  On: 02/24/2021 09:16      Labs: BNP (last 3 results) Recent Labs    11/25/20 2016  BNP 28.7   Basic Metabolic Panel: Recent Labs  Lab 02/27/21 0816 03/01/21 2204 03/02/21 0450  NA 142 135 134*  K 3.5 3.8 3.1*  CL 104 98 100  CO2 28 27 26   GLUCOSE 119* 133* 72  BUN 7 6 6   CREATININE 0.33* 0.55* 0.39*  CALCIUM 9.1 9.1 8.4*   Liver Function Tests: Recent Labs  Lab 02/27/21 0816  AST 40  ALT 14  ALKPHOS 96  BILITOT 0.4  PROT 6.9  ALBUMIN 3.9   No results for input(s): LIPASE, AMYLASE in the last 168 hours. No results for input(s): AMMONIA in the last 168 hours. CBC: Recent Labs  Lab 02/27/21 0816 03/01/21 2204 03/02/21 0450  WBC 12.1*  13.7* 12.3*  NEUTROABS 10.0*  --   --   HGB 12.0* 11.7* 10.0*  HCT 36.3* 34.6* 29.2*  MCV 97.1 98.0 99.3  PLT 252 237 202   Cardiac Enzymes: No results for input(s): CKTOTAL, CKMB, CKMBINDEX, TROPONINI in the last 168 hours. BNP: Invalid input(s): POCBNP CBG: No results for input(s): GLUCAP in the last 168 hours. D-Dimer No results for input(s): DDIMER in the last 72 hours. Hgb A1c No results for input(s): HGBA1C in the last 72 hours. Lipid Profile No results for input(s): CHOL, HDL, LDLCALC, TRIG, CHOLHDL, LDLDIRECT in the last 72 hours. Thyroid function studies No results for input(s): TSH, T4TOTAL, T3FREE, THYROIDAB in the last 72 hours.  Invalid input(s): FREET3 Anemia work up No results for input(s): VITAMINB12, FOLATE, FERRITIN, TIBC, IRON, RETICCTPCT in the last 72 hours. Urinalysis No results found for: COLORURINE, APPEARANCEUR, [place], [place], GLUCOSEU, [place], [place], [place], PROTEINUR, UROBILINOGEN, NITRITE, LEUKOCYTESUR Sepsis Labs Invalid input(s): PROCALCITONIN,  WBC,  LACTICIDVEN Microbiology Recent Results (from the past 240 hour(s))  Resp Panel by RT-PCR (Flu A&B, Covid) Nasopharyngeal Swab     Status: None   Collection Time: 03/01/21 10:42 PM   Specimen: Nasopharyngeal Swab; Nasopharyngeal(NP) swabs in vial transport medium  Result Value Ref Range Status   SARS Coronavirus 2 by RT PCR NEGATIVE NEGATIVE Final    Comment: (NOTE) SARS-CoV-2 target nucleic acids are NOT DETECTED.  The SARS-CoV-2 RNA is generally detectable in upper respiratory specimens during the acute phase of infection. The lowest concentration of SARS-CoV-2 viral copies this assay can detect is 138 copies/mL. A negative result does not preclude SARS-Cov-2 infection and should not be used as the sole basis for treatment or other patient management decisions. A negative result may occur with  improper specimen collection/handling, submission of specimen other than nasopharyngeal  swab, presence of viral mutation(s) within the areas targeted by this assay, and inadequate number of viral copies(<138 copies/mL). A negative result must be combined with clinical observations, patient history, and epidemiological information. The expected result is Negative.  Fact Sheet for Patients:  EntrepreneurPulse.com.au  Fact Sheet for Healthcare Providers:  IncredibleEmployment.be  This test is no t yet approved or cleared by the [place] FDA and  has been authorized for detection and/or diagnosis of SARS-CoV-2 by FDA under an Emergency Use Authorization (EUA). This EUA will remain  in effect (meaning this test can be used) for the duration of the COVID-19 declaration under Section 564(b)(1) of the Act, 21 U.S.C.section 360bbb-3(b)(1), unless the authorization is terminated  or revoked sooner.       Influenza A by PCR NEGATIVE NEGATIVE Final   Influenza B by PCR NEGATIVE  NEGATIVE Final    Comment: (NOTE) The Xpert Xpress SARS-CoV-2/FLU/RSV plus assay is intended as an aid in the diagnosis of influenza from Nasopharyngeal swab specimens and should not be used as a sole basis for treatment. Nasal washings and aspirates are unacceptable for Xpert Xpress SARS-CoV-2/FLU/RSV testing.  Fact Sheet for Patients: EntrepreneurPulse.com.au  Fact Sheet for Healthcare Providers: IncredibleEmployment.be  This test is not yet approved or cleared by the [place] FDA and has been authorized for detection and/or diagnosis of SARS-CoV-2 by FDA under an Emergency Use Authorization (EUA). This EUA will remain in effect (meaning this test can be used) for the duration of the COVID-19 declaration under Section 564(b)(1) of the Act, 21 U.S.C. section 360bbb-3(b)(1), unless the authorization is terminated or revoked.  Performed at [facility], [address] [place], [place] [address]       Total time spend on discharging this patient, including the last patient exam, discussing the hospital stay, instructions for ongoing care as it relates to all pertinent caregivers, as well as preparing the medical discharge records, prescriptions, and/or referrals as applicable, is 50 minutes.    Enzo Bi, MD  [facility] 03/02/2021, 10:10 AM

## 2021-03-02 NOTE — Consult Note (Signed)
Curtis Nawrot. is a 50 y.o. male  [id number]  Primary Cardiologist: Neoma Laming, MD Reason for Consultation:   HPI: Curtis Manning. is a 50 y.o. Caucasian male male with medical history significant for asthma, lymphoma, lung mass, pleural effusion and atrial fibrillation as well as GERD, who presents to the emergency room with acute onset of substernal chest pain that felt as somebody punching him, and was graded as 8/10 in severity with associated dyspnea and increased pain with deep breathing and without palpitations.  The patient admits to wheezing without cough or hemoptysis.  No dysuria, oliguria or hematuria or flank pain.  No nausea or vomiting or abdominal pain. Had pain in bother shoulders two days ago, none since then.   Review of Systems: Patient continues to complain of sternal chest pain between the breasts. Pain left axillary area. Reports shortness of breath worse than normal over the past few days.    Past Medical History:  Diagnosis Date   A-fib ([place])    Asthma    GERD (gastroesophageal reflux disease)    History of COVID-19    Lung mass    Lymphoma ([facility])    Pleural effusion     (Not in a hospital admission)     acyclovir  400 mg Oral BID   allopurinol  300 mg Oral BID   aspirin EC  81 mg Oral Daily   cholecalciferol  1,000 Units Oral Daily   diltiazem  240 mg Oral Daily   enoxaparin (LOVENOX) injection  40 mg Subcutaneous Q24H   feeding supplement  237 mL Oral TID BM   loratadine  10 mg Oral Daily   metoprolol  200 mg Oral Daily   montelukast  10 mg Oral QHS   multivitamin with minerals  1 tablet Oral Daily   pantoprazole  40 mg Oral Daily    Infusions:  sodium chloride 100 mL/hr at 03/02/21 0316    No Known Allergies  Social History   Socioeconomic History   Marital status: Married    Spouse name: Not on file   Number of children: Not on file   Years of education: Not on file   Highest education level: Not on file  Occupational  History   Not on file  Tobacco Use   Smoking status: Former    Packs/day: 0.50    Years: 20.00    Pack years: 10.00    Types: Cigarettes    Quit date: 11/25/2020    Years since quitting: 0.2   Smokeless tobacco: Never  Vaping Use   Vaping Use: Never used  Substance and Sexual Activity   Alcohol use: Yes    Comment: "5 to 6 shots on weekends"   Drug use: Never   Sexual activity: Not on file  Other Topics Concern   Not on file  Social History Narrative   Patient is a active smoker.;  Also liquor.  Lives with his wife in [place].  No children.  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 No respiratory difficulty HEENT: normal Neck: supple. no JVD. Carotids 2+ bilat; no bruits. No lymphadenopathy or thryomegaly appreciated. Cor: PMI nondisplaced. Regular rate & rhythm. No rubs, gallops or murmurs. Lungs: clear Abdomen: soft, nontender, nondistended. No hepatosplenomegaly. No bruits or masses. Good bowel sounds. Extremities: no cyanosis, clubbing, rash, edema Neuro: alert & oriented x 3, cranial nerves grossly intact. moves all 4 extremities w/o difficulty. Affect pleasant.  ECG: Sinus tachycardia, HR 113, ST elevations in inferior and lateral leads.   Results for orders placed or performed during the hospital encounter of 03/01/21 (from the past 24  hour(s))  Basic metabolic panel     Status: Abnormal   Collection Time: 03/01/21 10:04 PM  Result Value Ref Range   Sodium 135 135 - 145 mmol/L   Potassium 3.8 3.5 - 5.1 mmol/L   Chloride 98 98 - 111 mmol/L   CO2 27 22 - 32 mmol/L   Glucose, Bld 133 (H) 70 - 99 mg/dL   BUN 6 6 - 20 mg/dL   Creatinine, Ser 0.55 (L) 0.61 - 1.24 mg/dL   Calcium 9.1 8.9 - 10.3 mg/dL   GFR, Estimated >60 >60 mL/min   Anion gap 10 5 - 15  CBC     Status: Abnormal   Collection Time: 03/01/21 10:04 PM  Result Value Ref Range   WBC 13.7 (H) 4.0 - 10.5 K/uL   RBC 3.53 (L) 4.22 - 5.81 MIL/uL   Hemoglobin 11.7 (L) 13.0 - 17.0 g/dL   HCT 34.6 (L) 39.0 - 52.0 %   MCV 98.0 80.0 - 100.0 fL   MCH 33.1 26.0 - 34.0 pg   MCHC 33.8 30.0 - 36.0 g/dL   RDW 19.9 (H) 11.5 - 15.5 %   Platelets 237 150 - 400 K/uL   nRBC 0.0 0.0 - 0.2 %  Troponin I (High Sensitivity)     Status: Abnormal   Collection Time: 03/01/21 10:04 PM  Result Value Ref Range   Troponin I (High Sensitivity) 24 (H) <18 ng/L  Procalcitonin - Baseline     Status: None   Collection Time: 03/01/21 10:04 PM  Result Value Ref Range   Procalcitonin <0.10 ng/mL  Resp Panel by RT-PCR (Flu A&B, Covid) Nasopharyngeal Swab     Status: None   Collection Time: 03/01/21 10:42 PM   Specimen: Nasopharyngeal Swab; Nasopharyngeal(NP) swabs in vial transport medium  Result Value Ref Range   SARS Coronavirus 2 by RT PCR NEGATIVE NEGATIVE   Influenza A by PCR NEGATIVE NEGATIVE   Influenza B by PCR NEGATIVE NEGATIVE  Troponin I (High Sensitivity)     Status: Abnormal   Collection Time: 03/02/21 12:07 AM  Result Value Ref Range   Troponin I (High Sensitivity) 23 (H) <18 ng/L  Basic metabolic panel     Status: Abnormal   Collection Time: 03/02/21  4:50 AM  Result Value Ref Range   Sodium 134 (L) 135 - 145 mmol/L   Potassium 3.1 (L) 3.5 - 5.1 mmol/L   Chloride 100 98 - 111 mmol/L   CO2 26 22 - 32 mmol/L   Glucose, Bld 72 70 - 99 mg/dL   BUN 6 6 - 20 mg/dL  Creatinine, Ser 0.39 (L) 0.61 - 1.24 mg/dL   Calcium 8.4 (L) 8.9 - 10.3 mg/dL   GFR, Estimated >60 >60 mL/min   Anion gap 8 5 - 15  CBC     Status: Abnormal   Collection Time: 03/02/21  4:50 AM  Result Value Ref Range   WBC 12.3 (H) 4.0 - 10.5 K/uL   RBC 2.94 (L) 4.22 - 5.81 MIL/uL   Hemoglobin 10.0 (L) 13.0 - 17.0 g/dL   HCT 29.2 (L) 39.0 - 52.0 %   MCV 99.3 80.0 - 100.0 fL   MCH 34.0 26.0 - 34.0 pg   MCHC 34.2 30.0 - 36.0 g/dL   RDW 19.7 (H) 11.5 - 15.5 %   Platelets 202 150 - 400 K/uL   nRBC 0.0 0.0 - 0.2 %   DG Chest 2 View  Result Date: 03/01/2021 CLINICAL DATA:  Chest pain EXAM: CHEST - 2 VIEW COMPARISON:  12/01/2020, PET CT 02/23/2021 FINDINGS: Right-sided central venous port tip over the SVC. Slight decrease in size of left lower lobe lung masses. Improved aeration left apex. Residual left apical pleural thickening with linear densities. Small moderate left pleural effusion. Stable cardiomediastinal silhouette. No pneumothorax. IMPRESSION: Slight interval improvement in radiographic appearance of left thorax since 12/01/2020 with improved aeration left apex and decreased size of left lower lobe masses. Residual small moderate left pleural effusion Electronically Signed   By: Donavan Foil M.D.   On: 03/01/2021 22:25   CT Angio Chest PE W and/or Wo Contrast  Result Date: 03/02/2021 CLINICAL DATA:  Left lateral chest pain history of lymphoma EXAM: CT ANGIOGRAPHY CHEST WITH CONTRAST TECHNIQUE: Multidetector CT imaging of the chest was performed using the standard protocol during bolus administration of intravenous contrast. Multiplanar CT image reconstructions and MIPs were obtained to evaluate the vascular anatomy. CONTRAST:  37mL OMNIPAQUE IOHEXOL 350 MG/ML SOLN COMPARISON:  Chest x-ray 03/01/2021, PET CT 11/18/2020, CT chest 11/25/2000 FINDINGS: Cardiovascular: Satisfactory opacification of the pulmonary arteries to the segmental level. No evidence of pulmonary embolism. Aorta is  nonaneurysmal. No dissection is seen. Normal cardiac size. Small pericardial effusion, increased compared with PET CT performed recently, measures up to 14 mm maximum thickness on the right. Mediastinum/Nodes: Midline trachea. No thyroid mass. Extensive bulky mediastinal adenopathy adenopathy is redemonstrated. Bulky subcarinal nodal mass grossly stable in size. Mass effect on the left greater than right bronchi. Esophagus unremarkable. Lungs/Pleura: Small right and moderate left pleural effusion. Left pleural effusion is loculated and most of the fluid is subpulmonic. Improved aeration of left apex since prior chest CT, with residual consolidation and ground-glass density not significantly changed since interval PET CT earlier this month. Left lower lobe lung mass measuring 8.1 by 4.6 cm, grossly unchanged with recent PET-CT when utilizing similar measurement technique. Central low-density within the mass consistent with necrosis. 2.2 cm left anterior lung base nodule, previously 2.1 cm, grossly unchanged. Upper Abdomen: No acute findings Musculoskeletal: No acute osseous abnormality. Review of the MIP images confirms the above findings. IMPRESSION: 1. Negative for acute pulmonary embolus or aortic dissection. 2. Not much interval change in bulky mediastinal adenopathy since PET CT performed earlier this month. Grossly stable left lower lobe lung masses/nodules. Moderate loculated left pleural effusion with sizable subpulmonic fluid. Aeration at left upper lobe improved since CT from May, not much change since PET CT from earlier this month. Electronically Signed   By: Donavan Foil M.D.   On: 03/02/2021 00:02     ASSESSMENT AND PLAN: Patient had  nuclear medicine stress test in office on 12/12/20, results equivocal. Patient having chest pain and shortness of breath, abnormal EKG., troponin improving.   Discharge today with plans for CCTA in office tomorrow.  Engineer, drilling FNP-C

## 2021-03-03 ENCOUNTER — Encounter: Payer: Self-pay | Admitting: Internal Medicine

## 2021-03-07 ENCOUNTER — Other Ambulatory Visit: Payer: Self-pay

## 2021-03-07 ENCOUNTER — Encounter: Payer: Self-pay | Admitting: Internal Medicine

## 2021-03-07 MED ORDER — FLUCONAZOLE 100 MG PO TABS
400.0000 mg | ORAL_TABLET | Freq: Every day | ORAL | 0 refills | Status: DC
  Filled 2021-03-07: qty 120, 30d supply, fill #0
  Filled 2021-03-08: qty 88, 22d supply, fill #0
  Filled 2021-03-09: qty 32, 8d supply, fill #1

## 2021-03-07 MED ORDER — PREDNISONE 10 MG PO TABS
70.0000 mg | ORAL_TABLET | Freq: Every day | ORAL | 0 refills | Status: DC
  Filled 2021-03-07: qty 210, 30d supply, fill #0

## 2021-03-07 MED ORDER — PANTOPRAZOLE SODIUM 40 MG PO TBEC
DELAYED_RELEASE_TABLET | Freq: Every day | ORAL | 0 refills | Status: DC
  Filled 2021-03-07: qty 30, 30d supply, fill #0

## 2021-03-07 MED ORDER — SULFAMETHOXAZOLE-TRIMETHOPRIM 800-160 MG PO TABS
ORAL_TABLET | ORAL | 0 refills | Status: DC
  Filled 2021-03-07: qty 12, 30d supply, fill #0

## 2021-03-08 ENCOUNTER — Other Ambulatory Visit: Payer: Self-pay

## 2021-03-08 ENCOUNTER — Encounter: Payer: Self-pay | Admitting: Internal Medicine

## 2021-03-09 ENCOUNTER — Other Ambulatory Visit: Payer: Self-pay

## 2021-03-09 ENCOUNTER — Encounter: Payer: Self-pay | Admitting: Internal Medicine

## 2021-03-10 ENCOUNTER — Other Ambulatory Visit: Payer: Self-pay

## 2021-03-13 ENCOUNTER — Encounter: Payer: Self-pay | Admitting: Internal Medicine

## 2021-03-20 ENCOUNTER — Other Ambulatory Visit: Payer: Self-pay

## 2021-03-20 MED ORDER — METOPROLOL SUCCINATE ER 100 MG PO TB24
ORAL_TABLET | ORAL | 4 refills | Status: DC
  Filled 2021-03-20: qty 180, 90d supply, fill #0
  Filled 2021-03-22: qty 60, 30d supply, fill #0

## 2021-03-21 ENCOUNTER — Encounter: Payer: Self-pay | Admitting: Internal Medicine

## 2021-03-21 ENCOUNTER — Other Ambulatory Visit: Payer: Self-pay

## 2021-03-22 ENCOUNTER — Encounter: Payer: Self-pay | Admitting: Internal Medicine

## 2021-03-22 ENCOUNTER — Other Ambulatory Visit: Payer: Self-pay

## 2021-03-22 MED ORDER — ALBUTEROL SULFATE (2.5 MG/3ML) 0.083% IN NEBU
INHALATION_SOLUTION | RESPIRATORY_TRACT | 12 refills | Status: DC
  Filled 2021-03-22: qty 75, 7d supply, fill #0
  Filled 2021-04-07: qty 90, 7d supply, fill #1

## 2021-03-22 MED ORDER — BUPROPION HCL ER (XL) 150 MG PO TB24
ORAL_TABLET | ORAL | 11 refills | Status: DC
  Filled 2021-03-22 – 2021-03-23 (×2): qty 30, 30d supply, fill #0
  Filled 2021-04-19: qty 30, 30d supply, fill #1
  Filled 2021-08-18: qty 30, 30d supply, fill #2

## 2021-03-23 ENCOUNTER — Other Ambulatory Visit: Payer: Self-pay

## 2021-03-23 ENCOUNTER — Encounter: Payer: Self-pay | Admitting: Internal Medicine

## 2021-03-27 ENCOUNTER — Other Ambulatory Visit: Payer: Self-pay

## 2021-03-27 MED ORDER — MAGNESIUM OXIDE 400 MG PO TABS: ORAL_TABLET | ORAL | 11 refills | Status: DC

## 2021-03-28 ENCOUNTER — Other Ambulatory Visit: Payer: Self-pay

## 2021-03-28 MED ORDER — PANTOPRAZOLE SODIUM 40 MG PO TBEC: 40.0000 mg | DELAYED_RELEASE_TABLET | Freq: Every day | ORAL | 0 refills | Status: DC

## 2021-03-29 ENCOUNTER — Other Ambulatory Visit: Payer: Self-pay

## 2021-03-29 ENCOUNTER — Encounter: Payer: Self-pay | Admitting: Internal Medicine

## 2021-03-29 MED ORDER — SULFAMETHOXAZOLE-TRIMETHOPRIM 800-160 MG PO TABS
ORAL_TABLET | ORAL | 0 refills | Status: DC
  Filled 2021-04-07: qty 12, 30d supply, fill #0

## 2021-03-29 MED ORDER — MONTELUKAST SODIUM 10 MG PO TABS
10.0000 mg | ORAL_TABLET | Freq: Every day | ORAL | 3 refills | Status: DC
  Filled 2021-03-29: qty 30, 30d supply, fill #0

## 2021-03-29 MED ORDER — ACYCLOVIR 400 MG PO TABS
ORAL_TABLET | ORAL | 3 refills | Status: DC
  Filled 2021-03-29: qty 60, 30d supply, fill #0
  Filled 2021-05-01: qty 60, 30d supply, fill #1

## 2021-03-29 MED ORDER — ALLOPURINOL 300 MG PO TABS
ORAL_TABLET | ORAL | 3 refills | Status: DC
  Filled 2021-03-29: qty 60, 30d supply, fill #0
  Filled 2021-05-01: qty 60, 30d supply, fill #1

## 2021-03-29 MED ORDER — PANTOPRAZOLE SODIUM 40 MG PO TBEC: 40.0000 mg | DELAYED_RELEASE_TABLET | Freq: Every day | ORAL | 3 refills | Status: DC

## 2021-03-29 MED ORDER — BENZONATATE 100 MG PO CAPS
ORAL_CAPSULE | ORAL | 1 refills | Status: DC
  Filled 2021-03-29: qty 30, 30d supply, fill #0

## 2021-03-30 ENCOUNTER — Other Ambulatory Visit: Payer: Self-pay

## 2021-04-05 ENCOUNTER — Encounter: Payer: Self-pay | Admitting: Internal Medicine

## 2021-04-05 ENCOUNTER — Other Ambulatory Visit: Payer: Self-pay

## 2021-04-05 MED ORDER — FLUCONAZOLE 100 MG PO TABS
400.0000 mg | ORAL_TABLET | Freq: Every day | ORAL | 0 refills | Status: DC
  Filled 2021-04-05: qty 90, 22d supply, fill #0
  Filled 2021-05-01: qty 30, 7d supply, fill #1

## 2021-04-06 ENCOUNTER — Other Ambulatory Visit: Payer: Self-pay

## 2021-04-07 ENCOUNTER — Other Ambulatory Visit: Payer: Self-pay

## 2021-04-07 ENCOUNTER — Encounter: Payer: Self-pay | Admitting: Internal Medicine

## 2021-04-11 ENCOUNTER — Encounter: Payer: Self-pay | Admitting: Internal Medicine

## 2021-04-11 ENCOUNTER — Other Ambulatory Visit: Payer: Self-pay

## 2021-04-11 MED ORDER — PREDNISONE 10 MG PO TABS
ORAL_TABLET | ORAL | 0 refills | Status: DC
  Filled 2021-04-11: qty 147, 42d supply, fill #0

## 2021-04-12 ENCOUNTER — Encounter: Payer: Self-pay | Admitting: Internal Medicine

## 2021-04-12 ENCOUNTER — Other Ambulatory Visit: Payer: Self-pay

## 2021-04-12 MED ORDER — LEVOFLOXACIN 500 MG PO TABS
ORAL_TABLET | ORAL | 0 refills | Status: DC
  Filled 2021-04-12: qty 7, 7d supply, fill #0

## 2021-04-13 ENCOUNTER — Other Ambulatory Visit: Payer: Self-pay

## 2021-04-13 MED ORDER — BENZONATATE 100 MG PO CAPS: ORAL_CAPSULE | ORAL | 1 refills | Status: DC

## 2021-04-19 ENCOUNTER — Other Ambulatory Visit: Payer: Self-pay

## 2021-04-19 ENCOUNTER — Encounter: Payer: Self-pay | Admitting: Internal Medicine

## 2021-04-20 ENCOUNTER — Encounter: Payer: Self-pay | Admitting: Internal Medicine

## 2021-04-20 ENCOUNTER — Other Ambulatory Visit: Payer: Self-pay

## 2021-04-20 MED ORDER — ALBUTEROL SULFATE HFA 108 (90 BASE) MCG/ACT IN AERS: INHALATION_SPRAY | RESPIRATORY_TRACT | 6 refills | Status: DC

## 2021-04-20 MED ORDER — PANTOPRAZOLE SODIUM 40 MG PO TBEC
DELAYED_RELEASE_TABLET | ORAL | 3 refills | Status: DC
  Filled 2021-04-20: qty 30, 30d supply, fill #0

## 2021-04-20 MED ORDER — ALBUTEROL SULFATE HFA 108 (90 BASE) MCG/ACT IN AERS
INHALATION_SPRAY | RESPIRATORY_TRACT | 11 refills | Status: DC
  Filled 2021-04-20: qty 6.7, 25d supply, fill #0

## 2021-04-27 ENCOUNTER — Encounter: Payer: Self-pay | Admitting: Internal Medicine

## 2021-04-27 ENCOUNTER — Other Ambulatory Visit: Payer: Self-pay

## 2021-04-27 MED ORDER — SULFAMETHOXAZOLE-TRIMETHOPRIM 800-160 MG PO TABS
ORAL_TABLET | ORAL | 0 refills | Status: DC
  Filled 2021-04-27: qty 12, 14d supply, fill #0
  Filled 2021-05-08: qty 12, 30d supply, fill #0

## 2021-04-28 ENCOUNTER — Other Ambulatory Visit: Payer: Self-pay

## 2021-05-01 ENCOUNTER — Other Ambulatory Visit: Payer: Self-pay

## 2021-05-01 ENCOUNTER — Encounter: Payer: Self-pay | Admitting: Internal Medicine

## 2021-05-08 ENCOUNTER — Other Ambulatory Visit: Payer: Self-pay

## 2021-05-08 ENCOUNTER — Encounter: Payer: Self-pay | Admitting: Internal Medicine

## 2021-05-10 ENCOUNTER — Other Ambulatory Visit: Payer: Self-pay

## 2021-05-10 MED ORDER — PANTOPRAZOLE SODIUM 40 MG PO TBEC: DELAYED_RELEASE_TABLET | ORAL | 3 refills | Status: DC

## 2021-05-11 ENCOUNTER — Encounter: Payer: Self-pay | Admitting: Internal Medicine

## 2021-05-11 ENCOUNTER — Other Ambulatory Visit: Payer: Self-pay

## 2021-05-11 MED ORDER — METOPROLOL SUCCINATE ER 100 MG PO TB24
ORAL_TABLET | ORAL | 0 refills | Status: DC
  Filled 2021-05-11: qty 60, 30d supply, fill #0
  Filled 2021-06-21: qty 60, 30d supply, fill #1

## 2021-05-16 ENCOUNTER — Other Ambulatory Visit: Payer: Self-pay

## 2021-05-16 MED ORDER — SODIUM FLUORIDE 1.1 % DT CREA: TOPICAL_CREAM | DENTAL | 11 refills | Status: DC

## 2021-05-23 ENCOUNTER — Other Ambulatory Visit: Payer: Self-pay

## 2021-05-23 ENCOUNTER — Encounter: Payer: Self-pay | Admitting: Internal Medicine

## 2021-05-23 MED ORDER — LORATADINE 10 MG PO TABS
10.0000 mg | ORAL_TABLET | Freq: Every day | ORAL | 4 refills | Status: DC
  Filled 2021-05-23: qty 90, 90d supply, fill #0

## 2021-05-23 MED ORDER — DILTIAZEM HCL ER COATED BEADS 240 MG PO CP24
ORAL_CAPSULE | ORAL | 4 refills | Status: DC
  Filled 2021-05-23: qty 90, 90d supply, fill #0
  Filled ????-??-??: fill #1

## 2021-05-24 ENCOUNTER — Other Ambulatory Visit: Payer: Self-pay

## 2021-05-29 ENCOUNTER — Encounter: Payer: Self-pay | Admitting: Internal Medicine

## 2021-05-29 ENCOUNTER — Other Ambulatory Visit: Payer: Self-pay

## 2021-05-29 MED ORDER — MONTELUKAST SODIUM 10 MG PO TABS
10.0000 mg | ORAL_TABLET | Freq: Every day | ORAL | 3 refills | Status: DC
  Filled 2021-05-29: qty 30, 30d supply, fill #0
  Filled 2021-06-29: qty 30, 30d supply, fill #1

## 2021-05-29 MED ORDER — SULFAMETHOXAZOLE-TRIMETHOPRIM 800-160 MG PO TABS: ORAL_TABLET | ORAL | 0 refills | Status: DC

## 2021-05-30 ENCOUNTER — Encounter: Payer: Self-pay | Admitting: Internal Medicine

## 2021-06-02 ENCOUNTER — Encounter: Payer: Self-pay | Admitting: Internal Medicine

## 2021-06-02 ENCOUNTER — Other Ambulatory Visit: Payer: Self-pay

## 2021-06-02 MED ORDER — ACYCLOVIR 400 MG PO TABS
ORAL_TABLET | ORAL | 3 refills | Status: DC
Start: 2021-06-02 — End: 2022-12-18
  Filled 2021-06-02: qty 60, 30d supply, fill #0
  Filled 2021-06-29: qty 60, 30d supply, fill #1

## 2021-06-02 MED ORDER — ALLOPURINOL 300 MG PO TABS
ORAL_TABLET | ORAL | 3 refills | Status: DC
  Filled 2021-06-02: qty 60, 30d supply, fill #0
  Filled 2021-06-29: qty 60, 30d supply, fill #1

## 2021-06-02 MED ORDER — ALBUTEROL SULFATE HFA 108 (90 BASE) MCG/ACT IN AERS
INHALATION_SPRAY | RESPIRATORY_TRACT | 11 refills | Status: DC
  Filled 2021-06-02: qty 6.7, 25d supply, fill #0
  Filled 2021-08-09: qty 6.7, 25d supply, fill #1

## 2021-06-05 ENCOUNTER — Other Ambulatory Visit: Payer: Self-pay

## 2021-06-05 ENCOUNTER — Encounter: Payer: Self-pay | Admitting: Internal Medicine

## 2021-06-05 MED ORDER — SULFAMETHOXAZOLE-TRIMETHOPRIM 800-160 MG PO TABS
ORAL_TABLET | ORAL | 0 refills | Status: DC
  Filled 2021-06-05: qty 12, 30d supply, fill #0

## 2021-06-06 ENCOUNTER — Other Ambulatory Visit: Payer: Self-pay

## 2021-06-06 ENCOUNTER — Encounter: Payer: Self-pay | Admitting: Internal Medicine

## 2021-06-06 MED ORDER — FLUCONAZOLE 200 MG PO TABS
ORAL_TABLET | ORAL | 2 refills | Status: DC
  Filled 2021-06-06: qty 30, 15d supply, fill #0

## 2021-06-07 ENCOUNTER — Other Ambulatory Visit: Payer: Self-pay

## 2021-06-09 ENCOUNTER — Other Ambulatory Visit: Payer: Self-pay

## 2021-06-09 ENCOUNTER — Encounter: Payer: Self-pay | Admitting: Internal Medicine

## 2021-06-09 MED ORDER — PANTOPRAZOLE SODIUM 40 MG PO TBEC
40.0000 mg | DELAYED_RELEASE_TABLET | Freq: Every day | ORAL | 3 refills | Status: DC
Start: 2021-06-09 — End: 2022-12-18
  Filled 2021-06-09: qty 30, 30d supply, fill #0

## 2021-06-19 ENCOUNTER — Other Ambulatory Visit: Payer: Self-pay

## 2021-06-21 ENCOUNTER — Encounter: Payer: Self-pay | Admitting: Internal Medicine

## 2021-06-21 ENCOUNTER — Other Ambulatory Visit: Payer: Self-pay

## 2021-06-21 MED ORDER — FLUCONAZOLE 200 MG PO TABS
ORAL_TABLET | ORAL | 2 refills | Status: DC
  Filled 2021-06-21: qty 60, 30d supply, fill #0

## 2021-06-22 ENCOUNTER — Other Ambulatory Visit: Payer: Self-pay

## 2021-06-22 ENCOUNTER — Encounter: Payer: Self-pay | Admitting: Internal Medicine

## 2021-06-22 MED ORDER — METOPROLOL SUCCINATE ER 100 MG PO TB24
ORAL_TABLET | ORAL | 3 refills | Status: DC
  Filled 2021-06-22: qty 60, 30d supply, fill #0

## 2021-06-22 MED ORDER — ALBUTEROL SULFATE (2.5 MG/3ML) 0.083% IN NEBU
INHALATION_SOLUTION | RESPIRATORY_TRACT | 12 refills | Status: DC
  Filled 2021-06-22: qty 90, 8d supply, fill #0

## 2021-06-28 ENCOUNTER — Other Ambulatory Visit: Payer: Self-pay | Admitting: Internal Medicine

## 2021-06-29 ENCOUNTER — Encounter: Payer: Self-pay | Admitting: Internal Medicine

## 2021-06-29 ENCOUNTER — Other Ambulatory Visit: Payer: Self-pay

## 2021-06-29 MED ORDER — SULFAMETHOXAZOLE-TRIMETHOPRIM 800-160 MG PO TABS
ORAL_TABLET | ORAL | 0 refills | Status: DC
Start: 2021-06-29 — End: 2022-12-18
  Filled 2021-06-29: qty 12, 30d supply, fill #0

## 2021-07-04 ENCOUNTER — Encounter: Payer: Self-pay | Admitting: Internal Medicine

## 2021-07-04 ENCOUNTER — Other Ambulatory Visit: Payer: Self-pay

## 2021-07-04 MED ORDER — DILTIAZEM HCL ER COATED BEADS 240 MG PO CP24
240.0000 mg | ORAL_CAPSULE | ORAL | 4 refills | Status: DC
Start: 2021-07-04 — End: 2022-12-18
  Filled 2021-07-04: qty 90, 90d supply, fill #0

## 2021-07-14 NOTE — Progress Notes (Signed)
..  Patient Assist/Replace for the following has been terminated. Medication: Rituxan Reason for Termination: Active medicaid coverage. Last DOS: 02/06/2021.  Marland KitchenJuan Quam, CPhT IV Drug Replacement Specialist [place] Phone: [phone]

## 2021-07-14 NOTE — Progress Notes (Signed)
..  Patient Assist/Replace for the following has been terminated. Medication: Fulphila Reason for Termination: Active medicaid coverage. Last DOS: 02/07/2021.  Marland KitchenJuan Quam, CPhT IV Drug Replacement Specialist [place] Phone: [phone]

## 2021-08-09 ENCOUNTER — Other Ambulatory Visit: Payer: Self-pay

## 2021-08-09 ENCOUNTER — Encounter: Payer: Self-pay | Admitting: Internal Medicine

## 2021-08-10 ENCOUNTER — Other Ambulatory Visit: Payer: Self-pay

## 2021-08-11 ENCOUNTER — Other Ambulatory Visit: Payer: Self-pay

## 2021-08-14 ENCOUNTER — Other Ambulatory Visit: Payer: Self-pay

## 2021-08-14 ENCOUNTER — Encounter: Payer: Self-pay | Admitting: Internal Medicine

## 2021-08-14 MED ORDER — ALBUTEROL SULFATE HFA 108 (90 BASE) MCG/ACT IN AERS
INHALATION_SPRAY | RESPIRATORY_TRACT | 11 refills | Status: DC
  Filled 2021-08-14: qty 6.7, 25d supply, fill #0

## 2021-08-18 ENCOUNTER — Other Ambulatory Visit: Payer: Self-pay

## 2021-08-18 ENCOUNTER — Encounter: Payer: Self-pay | Admitting: Internal Medicine

## 2021-09-01 ENCOUNTER — Other Ambulatory Visit: Payer: Self-pay

## 2021-09-01 MED ORDER — MAGNESIUM OXIDE 400 MG PO TABS: ORAL_TABLET | Freq: Two times a day (BID) | ORAL | 11 refills | Status: DC

## 2021-09-28 ENCOUNTER — Other Ambulatory Visit: Payer: Self-pay

## 2021-09-29 ENCOUNTER — Telehealth: Payer: Self-pay | Admitting: Pharmacy Technician

## 2021-09-29 NOTE — Telephone Encounter (Signed)
Notified by spouse that patient is deceased.   ? ?Jacquelynn Cree ?Care Manager ?[facility] ?

## 2021-09-30 DEATH — deceased

## 2021-11-14 ENCOUNTER — Other Ambulatory Visit: Payer: Self-pay

## 2022-01-22 ENCOUNTER — Other Ambulatory Visit: Payer: Self-pay

## 2022-06-08 LAB — HISTOPLASMA ANTIGEN, URINE: Histoplasma Antigen, urine: <0.5 (ref <0.5)

## 2022-06-23 IMAGING — CR DG CHEST 2V
1 series · 2 of 2 positions shown · non-contrast
Comparison: 12/01/2020, PET CT 02/23/2021

CLINICAL DATA: Chest pain

EXAM:
CHEST - 2 VIEW

[Series 1: dg chest 2 view · 0.14mm/px · 2 of 2 slices shown]
[im 1/2]
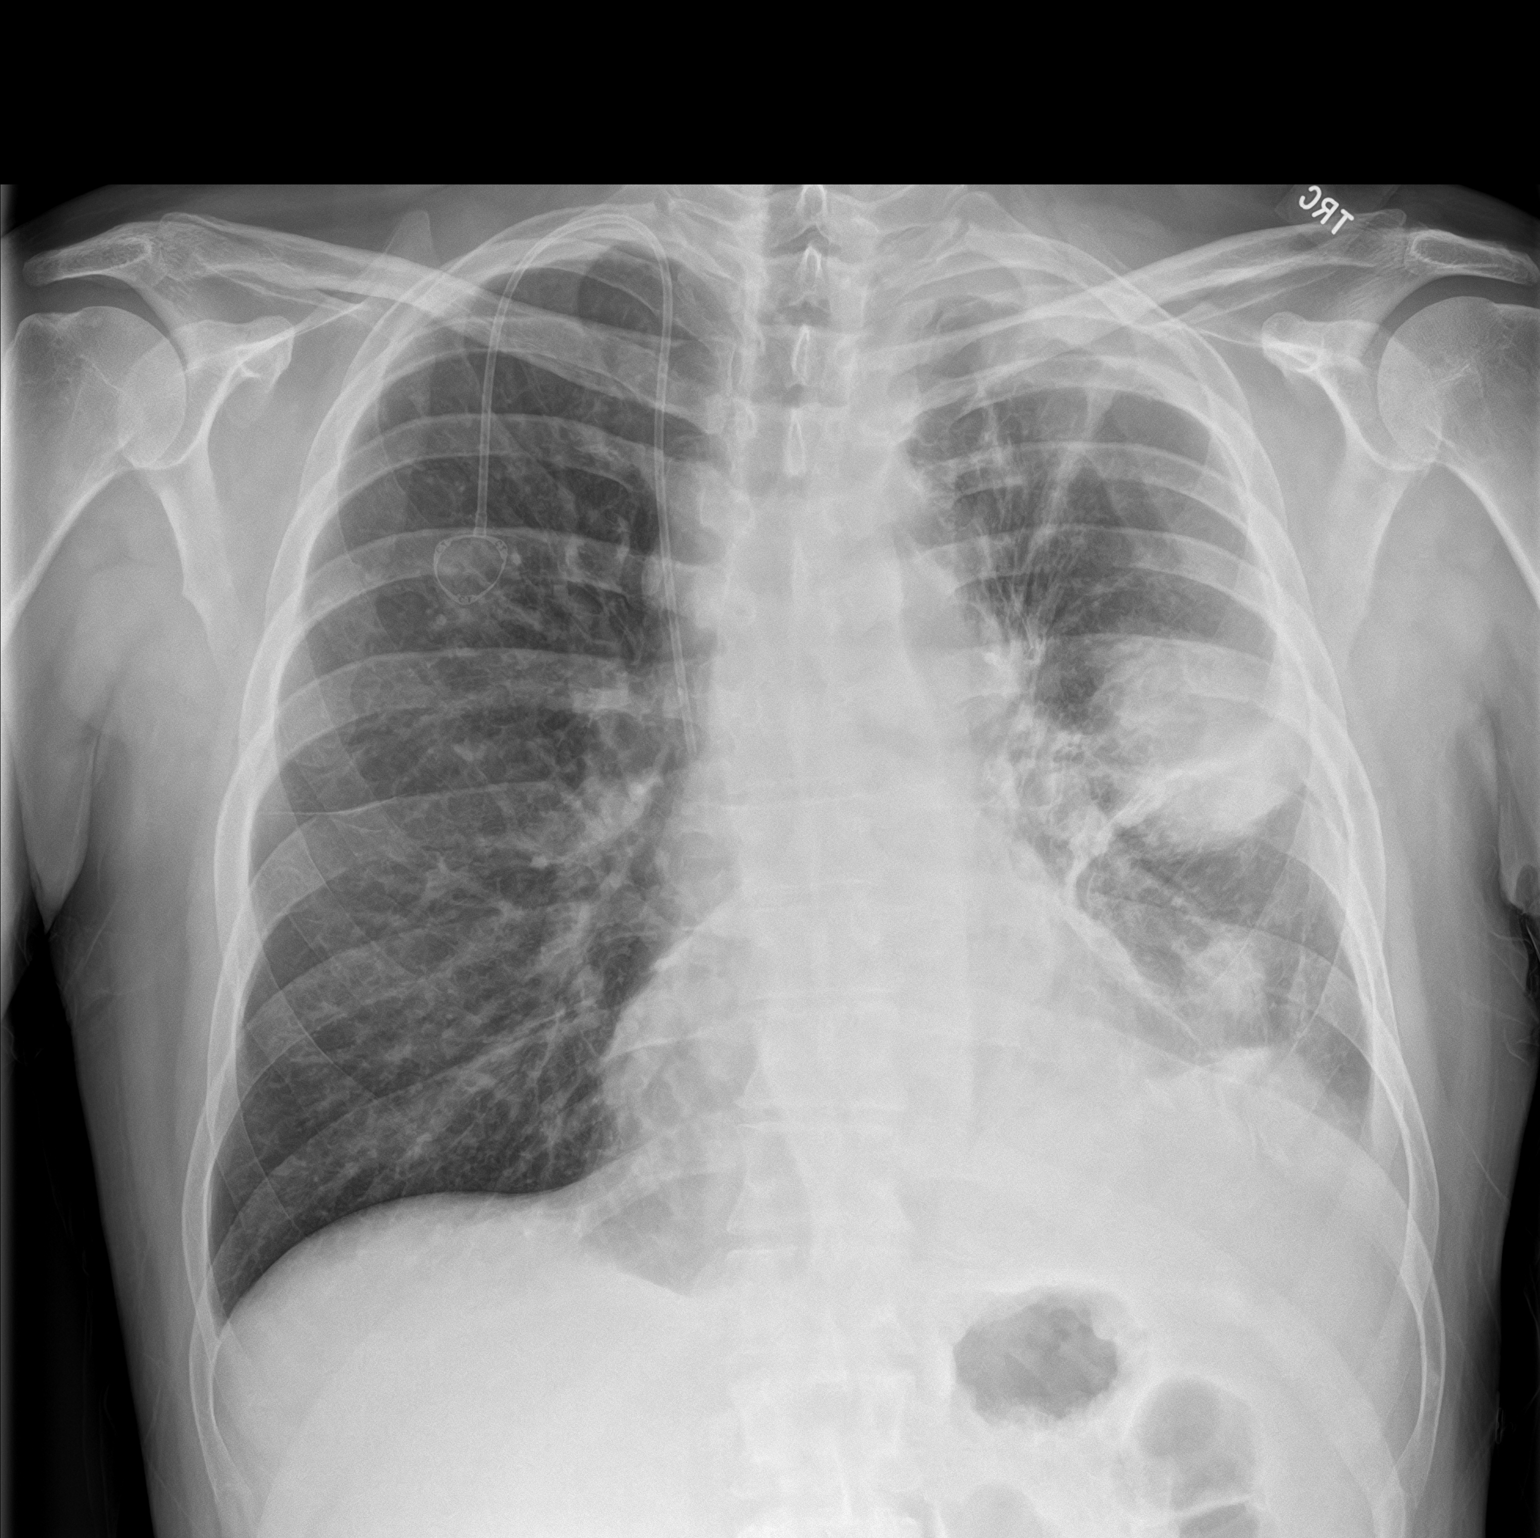
[im 2/2]
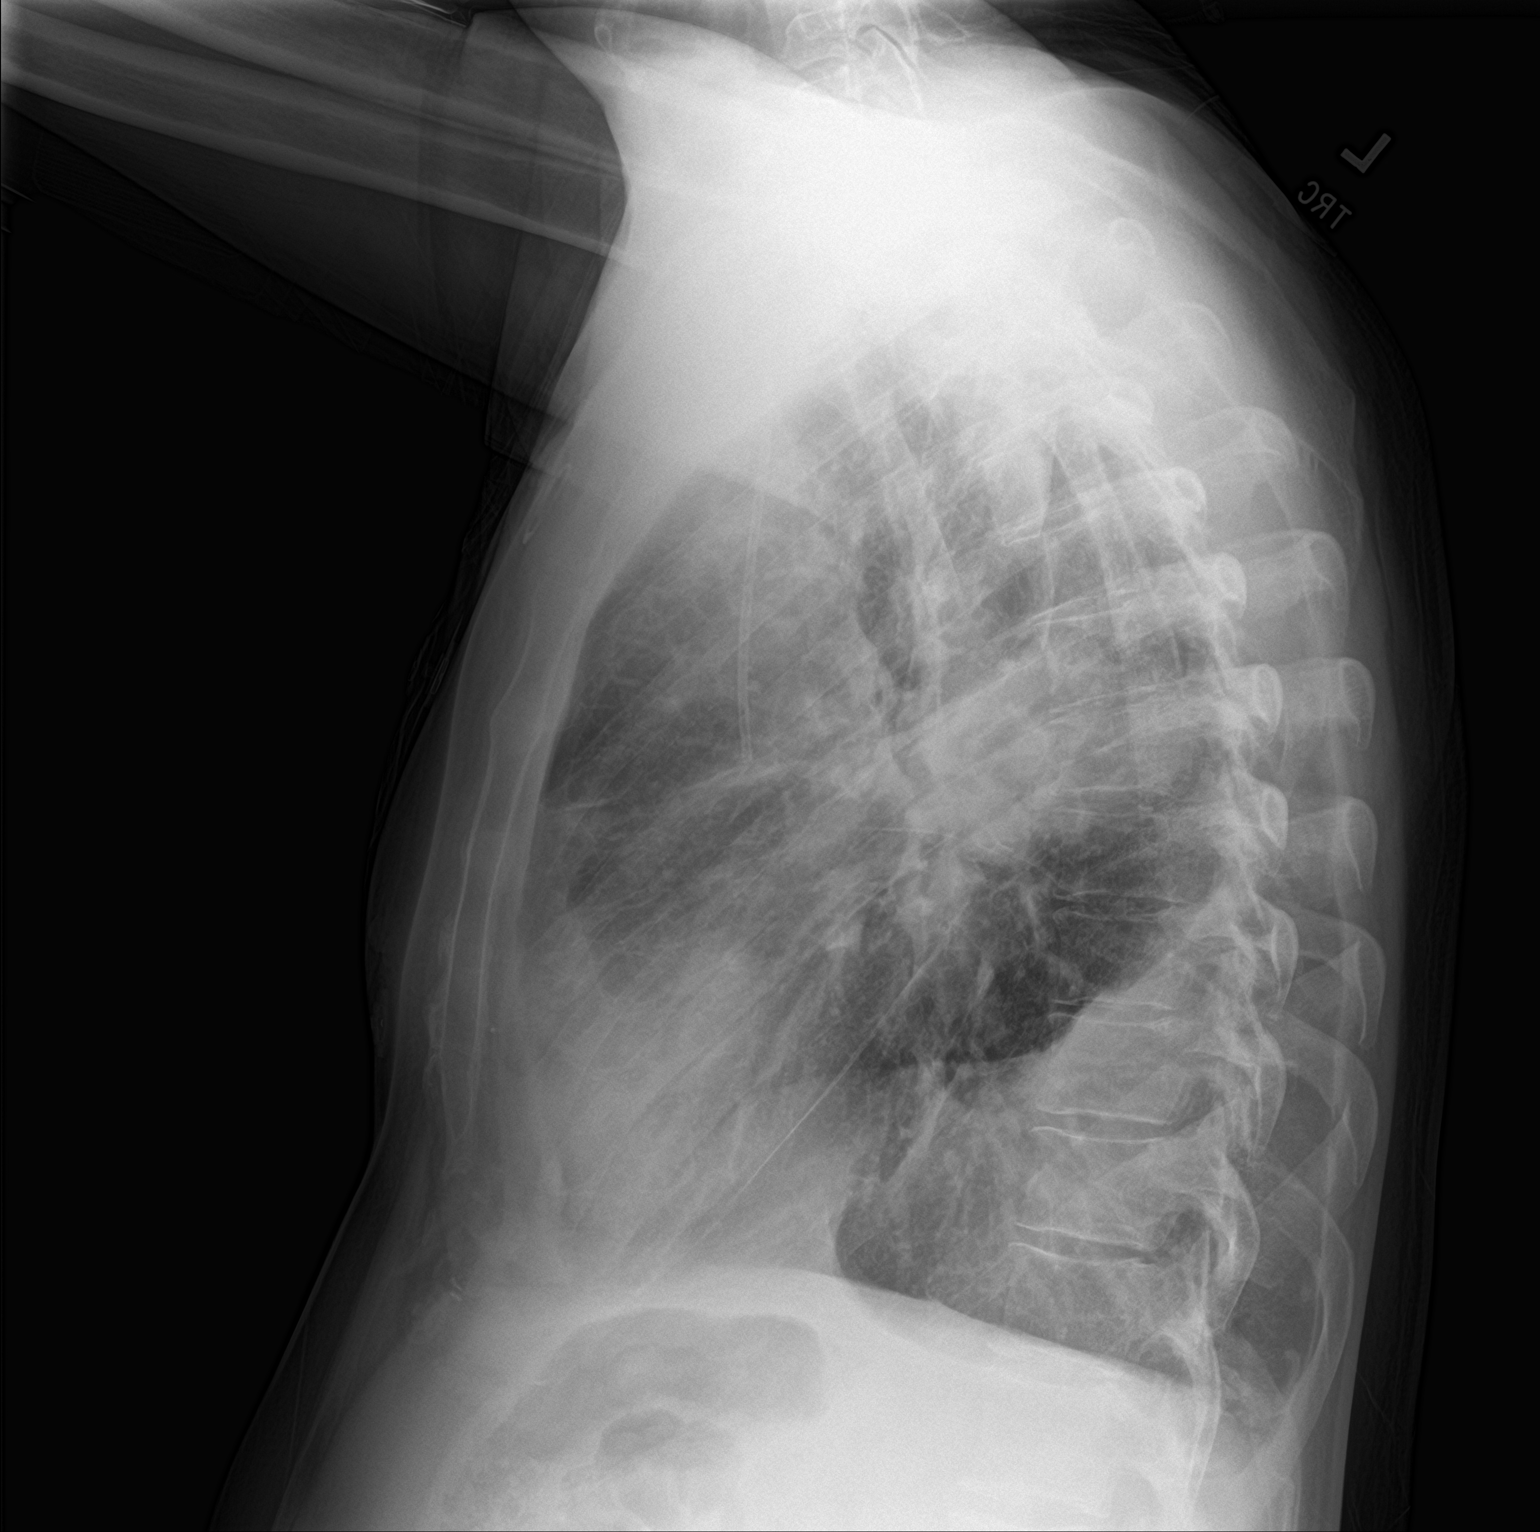

[2 of 2 positions shown; findings below may reference images not displayed]

FINDINGS: Right-sided central venous port tip over the SVC. Slight decrease in
size of left lower lobe lung masses. Improved aeration left apex.
Residual left apical pleural thickening with linear densities. Small
moderate left pleural effusion. Stable cardiomediastinal silhouette.
No pneumothorax.
IMPRESSION: Slight interval improvement in radiographic appearance of left
thorax since 12/01/2020 with improved aeration left apex and
decreased size of left lower lobe masses. Residual small moderate
left pleural effusion

## 2022-12-07 ENCOUNTER — Encounter: Payer: Self-pay | Admitting: Internal Medicine

## 2022-12-07 NOTE — Telephone Encounter (Signed)
x
